# Patient Record
Sex: Male | Born: 1975
Health system: Southern US, Community
[De-identification: ages and names within clinical notes are randomized; demographics above are authoritative.]

## PROBLEM LIST (undated history)

## (undated) DIAGNOSIS — S14109A Unspecified injury at unspecified level of cervical spinal cord, initial encounter: Secondary | ICD-10-CM

## (undated) DIAGNOSIS — F1910 Other psychoactive substance abuse, uncomplicated: Secondary | ICD-10-CM

## (undated) DIAGNOSIS — N200 Calculus of kidney: Secondary | ICD-10-CM

## (undated) HISTORY — DX: Calculus of kidney: N20.0

## (undated) HISTORY — DX: Unspecified injury at unspecified level of cervical spinal cord, initial encounter: S14.109A

## (undated) HISTORY — PX: BRAIN SURGERY: SHX531

---
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
## 2003-07-14 DIAGNOSIS — S14109A Unspecified injury at unspecified level of cervical spinal cord, initial encounter: Secondary | ICD-10-CM

## 2003-07-14 HISTORY — DX: Unspecified injury at unspecified level of cervical spinal cord, initial encounter: S14.109A

## 2003-07-14 HISTORY — PX: OTHER SURGICAL HISTORY: SHX169

## 2010-07-13 DIAGNOSIS — N200 Calculus of kidney: Secondary | ICD-10-CM

## 2010-07-13 HISTORY — DX: Calculus of kidney: N20.0

## 2014-03-09 ENCOUNTER — Ambulatory Visit: Payer: PRIVATE HEALTH INSURANCE | Attending: Family Medicine | Admitting: Family Medicine

## 2014-03-09 ENCOUNTER — Encounter: Payer: Self-pay | Admitting: Family Medicine

## 2014-03-09 VITALS — BP 97/67 | HR 68 | Temp 98.3°F | Resp 20 | Ht 74.0 in | Wt 169.2 lb

## 2014-03-09 DIAGNOSIS — M216X9 Other acquired deformities of unspecified foot: Secondary | ICD-10-CM | POA: Diagnosis not present

## 2014-03-09 DIAGNOSIS — F411 Generalized anxiety disorder: Secondary | ICD-10-CM | POA: Insufficient documentation

## 2014-03-09 DIAGNOSIS — M6281 Muscle weakness (generalized): Secondary | ICD-10-CM | POA: Diagnosis not present

## 2014-03-09 DIAGNOSIS — W1789XA Other fall from one level to another, initial encounter: Secondary | ICD-10-CM | POA: Insufficient documentation

## 2014-03-09 DIAGNOSIS — IMO0002 Reserved for concepts with insufficient information to code with codable children: Secondary | ICD-10-CM | POA: Diagnosis not present

## 2014-03-09 DIAGNOSIS — M62838 Other muscle spasm: Secondary | ICD-10-CM | POA: Insufficient documentation

## 2014-03-09 DIAGNOSIS — Z9181 History of falling: Secondary | ICD-10-CM | POA: Insufficient documentation

## 2014-03-09 DIAGNOSIS — F17200 Nicotine dependence, unspecified, uncomplicated: Secondary | ICD-10-CM | POA: Insufficient documentation

## 2014-03-09 DIAGNOSIS — F329 Major depressive disorder, single episode, unspecified: Secondary | ICD-10-CM | POA: Insufficient documentation

## 2014-03-09 DIAGNOSIS — F3289 Other specified depressive episodes: Secondary | ICD-10-CM | POA: Diagnosis present

## 2014-03-09 DIAGNOSIS — F32A Depression, unspecified: Secondary | ICD-10-CM | POA: Insufficient documentation

## 2014-03-09 DIAGNOSIS — Z981 Arthrodesis status: Secondary | ICD-10-CM | POA: Diagnosis not present

## 2014-03-09 DIAGNOSIS — S14109S Unspecified injury at unspecified level of cervical spinal cord, sequela: Secondary | ICD-10-CM

## 2014-03-09 DIAGNOSIS — F419 Anxiety disorder, unspecified: Secondary | ICD-10-CM | POA: Insufficient documentation

## 2014-03-09 NOTE — Progress Notes (Signed)
Establish care Stated had a work Accident on 2005, stated he is paralyzed partially  Rt leg  Need referral for rehab

## 2014-03-09 NOTE — Progress Notes (Signed)
   Subjective:    Patient ID: Billy Kelley, male    DOB: [birth year], 38 y.o.   MRN: [id number] CC: establish care  HPI 38 year old male presents with his mother  [name]) to discuss the following:  1. Previous spinal cord injury:  Spinal cord injury 2005. 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 Patient is status post spinal fusion of C4 through C6. His fall left him with a right footdrop. He ambulates with a R AFO and a  cane. His fall left him with weakness in his right arm,  contracture is right hand especially the digits 2 through 4. He has decreased range of motion in his right shoulder to 90 of abduction. He has most recently been treated by Dr. Faythe Ghee in [place], [place] area (specifically [place], [place]). He presents today with records and all of his prescriptions. He has been on methadone treatment since 2010 prescribed by Dr. Susa Simmonds. His current methadone dose is 20 mg twice daily. He currently has a 3 week supply left. Previous treatments included other narcotics,   2. Mood disorder:  Patient diagnosed with anxiety and depression in 2014. Prior to Prozac he was tried on Zoloft without any improvement of symptoms. He currently takes Prozac 40 mg once daily along with Klonopin 0.5 mg 3 times a day as needed for anxiety. He is interested in establishing with a mental health provider in [place].  Social history: Current smoker, desires to quit.  Review of Systems  As per history of present illness      Objective:   Physical Exam BP 97/67  Pulse 68  Temp(Src) 98.3 F (36.8 C) (Oral)  Resp 20  Ht  (1.88 m)  Wt 169 lb 3.2 oz (76.749 kg)  BMI 21.71 kg/m2  SpO2 96% General appearance: alert, cooperative and no distress Lungs: clear to auscultation bilaterally Heart: regular rate and rhythm, S1, S2 normal, no murmur, click, rub or gallop Neurologic:  CN: intact Motor:   patient walks with a right foot brace and a can. His contractures at his  second and fourth digits on his right hand. His spasticity in his right elbow. He expresses his shoulder with decreased range of motion to 90.   sensation: Grossly intact Gait: Patient's gait is slow and somewhat shuffling on the right.      Assessment & Plan:

## 2014-03-09 NOTE — Assessment & Plan Note (Signed)
History of anxiety currently stable from that standpoint:  Referral to psychology to assist the patient in establishing with mental health.

## 2014-03-09 NOTE — Assessment & Plan Note (Signed)
A: spinal cord injury with sequela of right-sided spasticity in his upper extremity a right foot drop. Patient's symptoms currently controlled with baclofen, gabapentin and methadone. P: Referral placed to pain management for maintenance therapy versus a review and medically supervised change in therapy, patient is amenable to whatever is best and recommended.

## 2014-03-09 NOTE — Patient Instructions (Signed)
Billy Kelley,  Thank you for coming in today. It was a pleasure meeting you. I look forward to being your primary doctor.  Regarding pain management, I placed a referral our referral coordinators will work on scheduling.   Regarding depression and anxiety: referral to psychology, see our social worker Tywan about resources.  See me in 2 weeks so we can f/u plan for pain management.  Dr. Armen Pickup

## 2014-03-09 NOTE — Assessment & Plan Note (Signed)
Patient is a current smoker who desires to quit.   I will be sure to give him smoking cessation resources  at his followup appointment.

## 2014-03-09 NOTE — Assessment & Plan Note (Signed)
History of depression currently stable from that standpoint:  Referral to psychology to assist the patient in establishing with mental health.

## 2014-03-12 ENCOUNTER — Telehealth: Payer: Self-pay | Admitting: *Deleted

## 2014-03-12 NOTE — Progress Notes (Signed)
Recommend seeing [facility] [address] [place] [address] [phone]  Or [facility] if weaning desired  Erick Colace M.D. [facility] FAAPM&R (Sports Med, Neuromuscular Med) Diplomate Am Board of Electrodiagnostic Med

## 2014-03-12 NOTE — Telephone Encounter (Signed)
LCSW followed up with patient after meeting patient on 8/28. LCSW followed up with [facility], who stated that their next available appointment is in November. LCSW reiterated that patient cant contact his insurance carrier (Medicare) to identify psychiatry providers in the area, but can present at [facility] or [facility] for a walk-in for sooner services if needed.  Beverly Sessions MSW, LCSW

## 2014-03-16 ENCOUNTER — Telehealth: Payer: Self-pay | Admitting: Family Medicine

## 2014-03-16 NOTE — Telephone Encounter (Signed)
Called patient. Spoke with his mother. Still working on finding pain management/spasticity doctor. Also working on Magazine features editor comp to [place] since [employer] was previous payor source.   Aside: reviewed medical records. Patient on methadone therapy at least since 09/11/2011. I discussed with Dr. Kandee Keen, agrees with my plan to continue maintenance methadone therapy at 20 mg PO BID until patient can establish with PMR/neuro.

## 2014-03-20 ENCOUNTER — Telehealth: Payer: Self-pay | Admitting: Family Medicine

## 2014-03-20 NOTE — Progress Notes (Signed)
We don't rx or wean methadone here Try [facility] or [facility] [address] [place] [address] [phone]  For Detox try [facility]

## 2014-03-20 NOTE — Telephone Encounter (Signed)
Message copied by Dessa Phi on Tue Mar 20, 2014 10:54 AM ------      Message from: Dessa Phi      Created: Fri Mar 09, 2014  4:12 PM       Dr. Wynn Banker,      This is a patient I saw today who has moved to the area from [place] on Methadone therapy. He comes today with records and all of his medications.        Will you be able to accommodate him in your clinic or recommend other pain management clinics in the area? He is amenable to a methadone wean if recommended.  ------

## 2014-03-20 NOTE — Telephone Encounter (Signed)
Please call patient with recommendations from Dr. Wynn Banker:  Recommend seeing [facility]  [address]  [place] [address]  [phone]   Or [facility] if weaning desired   Erick Colace M.D.  [facility]  FAAPM&R (Sports Med, Neuromuscular Med)  Diplomate Am Board of Electrodiagnostic Med

## 2014-03-21 ENCOUNTER — Telehealth: Payer: Self-pay | Admitting: *Deleted

## 2014-03-21 ENCOUNTER — Telehealth: Payer: Self-pay | Admitting: Family Medicine

## 2014-03-21 NOTE — Telephone Encounter (Signed)
Pt. Returning call for results. Please f/u with pt.

## 2014-03-21 NOTE — Telephone Encounter (Signed)
Left voice message to returned call        Lora Paula, MD at 03/20/2014 10:54 AM      Status: Signed            Please call patient with recommendations from Dr. Wynn Banker:   Recommend seeing [facility]   [address]   [place] [address]   [phone]    Or [facility] if weaning desired    Erick Colace M.D.   [facility]   FAAPM&R (Sports Med, Neuromuscular Med)   Diplomate Am Board of Electrodiagnostic Med

## 2014-03-22 ENCOUNTER — Encounter: Payer: Self-pay | Admitting: Family Medicine

## 2014-03-22 ENCOUNTER — Ambulatory Visit: Payer: PRIVATE HEALTH INSURANCE | Attending: Family Medicine | Admitting: Family Medicine

## 2014-03-22 VITALS — BP 97/63 | HR 70 | Temp 98.1°F | Resp 16 | Ht 74.0 in | Wt 172.6 lb

## 2014-03-22 DIAGNOSIS — M545 Low back pain, unspecified: Secondary | ICD-10-CM | POA: Insufficient documentation

## 2014-03-22 DIAGNOSIS — S14109S Unspecified injury at unspecified level of cervical spinal cord, sequela: Secondary | ICD-10-CM

## 2014-03-22 DIAGNOSIS — M25559 Pain in unspecified hip: Secondary | ICD-10-CM | POA: Diagnosis not present

## 2014-03-22 DIAGNOSIS — F411 Generalized anxiety disorder: Secondary | ICD-10-CM | POA: Diagnosis not present

## 2014-03-22 DIAGNOSIS — X58XXXA Exposure to other specified factors, initial encounter: Secondary | ICD-10-CM | POA: Diagnosis not present

## 2014-03-22 DIAGNOSIS — Z23 Encounter for immunization: Secondary | ICD-10-CM

## 2014-03-22 DIAGNOSIS — R231 Pallor: Secondary | ICD-10-CM

## 2014-03-22 DIAGNOSIS — IMO0002 Reserved for concepts with insufficient information to code with codable children: Secondary | ICD-10-CM | POA: Insufficient documentation

## 2014-03-22 DIAGNOSIS — I9589 Other hypotension: Secondary | ICD-10-CM

## 2014-03-22 DIAGNOSIS — I952 Hypotension due to drugs: Secondary | ICD-10-CM

## 2014-03-22 DIAGNOSIS — F17200 Nicotine dependence, unspecified, uncomplicated: Secondary | ICD-10-CM

## 2014-03-22 DIAGNOSIS — G8929 Other chronic pain: Secondary | ICD-10-CM | POA: Diagnosis not present

## 2014-03-22 DIAGNOSIS — T50904A Poisoning by unspecified drugs, medicaments and biological substances, undetermined, initial encounter: Secondary | ICD-10-CM

## 2014-03-22 DIAGNOSIS — I959 Hypotension, unspecified: Secondary | ICD-10-CM | POA: Insufficient documentation

## 2014-03-22 LAB — COMPLETE METABOLIC PANEL WITH GFR
ALT: 19 U/L (ref 0–53)
AST: 17 U/L (ref 0–37)
Albumin: 4.3 g/dL (ref 3.5–5.2)
Alkaline Phosphatase: 58 U/L (ref 39–117)
BILIRUBIN TOTAL: 0.4 mg/dL (ref 0.2–1.2)
BUN: 13 mg/dL (ref 6–23)
CALCIUM: 9.2 mg/dL (ref 8.4–10.5)
CHLORIDE: 103 meq/L (ref 96–112)
CO2: 30 mEq/L (ref 19–32)
CREATININE: 0.79 mg/dL (ref 0.50–1.35)
GFR, Est African American: >89 mL/min
GFR, Est Non African American: >89 mL/min
Glucose, Bld: 103 mg/dL — ABNORMAL HIGH (ref 70–99)
Potassium: 4.2 mEq/L (ref 3.5–5.3)
Sodium: 141 mEq/L (ref 135–145)
Total Protein: 6.6 g/dL (ref 6.0–8.3)

## 2014-03-22 LAB — CBC
HEMATOCRIT: 38.6 % — AB (ref 39.0–52.0)
Hemoglobin: 13.6 g/dL (ref 13.0–17.0)
MCH: 29.2 pg (ref 26.0–34.0)
MCHC: 35.2 g/dL (ref 30.0–36.0)
MCV: 82.8 fL (ref 78.0–100.0)
PLATELETS: 215 10*3/uL (ref 150–400)
RBC: 4.66 MIL/uL (ref 4.22–5.81)
RDW: 14.2 % (ref 11.5–15.5)
WBC: 4.8 10*3/uL (ref 4.0–10.5)

## 2014-03-22 MED ORDER — METHADONE HCL 10 MG PO TABS: 20.0000 mg | ORAL_TABLET | Freq: Two times a day (BID) | ORAL | Status: DC

## 2014-03-22 NOTE — Assessment & Plan Note (Signed)
A: persistent P: smoking cessation recommended

## 2014-03-22 NOTE — Progress Notes (Signed)
F/u depression

## 2014-03-22 NOTE — Assessment & Plan Note (Signed)
A: chronic low BP noted. Likely due to medications. P: CBC CMP

## 2014-03-22 NOTE — Assessment & Plan Note (Signed)
A: chronic back and flank pain P: Refilled methadone Recomnmendations for pain management Recommended water exercise

## 2014-03-22 NOTE — Progress Notes (Signed)
   Subjective:    Patient ID: Billy Kelley, male    DOB: 01/23/1976, 38 y.o.   MRN: [id number] CC: 2 week f/u for depression, chronic pain following spinal cord injury  HPI 38 year old male presents for follow visit discussed the following:  #1 chronic right-sided low back and hip pain: Patient with chronic right-sided low back and hip pain following spinal cord injury. He is on chronic methadone therapy. He is working to establish with pain management since moving to [place] from [place]. Today he reports having bad right-sided flank pain. He's had this pain before. He is been worked up in the past has had negative CTs, MRIs and blood work. Pain is sharp, tight flank pain. He denies dysuria and hematuria.  Soc hx: current smoker  Review of Systems As per HPI  GAD-7 score of 14. 2 across the board.     Objective:   Physical Exam BP 97/63  Pulse 70  Temp(Src) 98.1 F (36.7 C) (Oral)  Resp 16  Ht  (1.88 m)  Wt 172 lb 9.6 oz (78.291 kg)  BMI 22.15 kg/m2  SpO2 97% General appearance: alert, cooperative and no distress Back: symmetric, no curvature. No CVA tenderness. R flank tightness.        Assessment & Plan:

## 2014-03-22 NOTE — Patient Instructions (Addendum)
Mr. Couper,  Thank you for coming back to see me today. It was a pleasure seeing you.   Pain management recommendations   [facility]  [address]  [place] [address]  [phone]   Or [facility] if weaning desired   I have refilled methadone for one month supply. Please f/u in 3-4 weeks for refill and f/u progress with establishing with pain management.   Smoking cessation support: smoking cessation hotline: 1-800-QUIT-NOW.  Smoking cessation classes are available through [facility] and [facility]. Call [phone] or visit our website at [email or web address].  Dr. Armen Pickup

## 2014-03-28 ENCOUNTER — Telehealth: Payer: Self-pay | Admitting: *Deleted

## 2014-03-28 NOTE — Telephone Encounter (Signed)
Left voice to return call  Lora Paula, MD at 03/20/2014 10:54 AM      Status: Signed            Please call patient with recommendations from Dr. Wynn Banker:   Recommend seeing [facility]   [address]   [place] [address]   [phone]    Or [facility] if weaning desired    Erick Colace M.D.   [facility]   FAAPM&R (Sports Med, Neuromuscular Med)   Diplomate Am Board of Electrodiagnostic Med

## 2014-04-02 ENCOUNTER — Encounter: Payer: Self-pay | Admitting: *Deleted

## 2014-04-02 DIAGNOSIS — F32A Depression, unspecified: Secondary | ICD-10-CM

## 2014-04-02 DIAGNOSIS — F329 Major depressive disorder, single episode, unspecified: Secondary | ICD-10-CM

## 2014-04-02 NOTE — Progress Notes (Signed)
LCSW met with patient in order to follow up on psychiatry referral. LCSW recommended that patient contact medicaid about a referral. LCSW also recommended that patient walk in at [facility] for more immediate care. LCSW also encouraged patient to be open to a change in medication depending on provider's preferences.  Christene Lye MSW, LCSW

## 2014-04-12 ENCOUNTER — Ambulatory Visit: Payer: Medicare Other | Attending: Family Medicine | Admitting: Family Medicine

## 2014-04-12 ENCOUNTER — Encounter: Payer: Self-pay | Admitting: Family Medicine

## 2014-04-12 VITALS — BP 99/66 | HR 98 | Temp 97.5°F | Resp 18 | Ht 73.0 in | Wt 175.0 lb

## 2014-04-12 DIAGNOSIS — T148 Other injury of unspecified body region: Secondary | ICD-10-CM | POA: Insufficient documentation

## 2014-04-12 DIAGNOSIS — G8921 Chronic pain due to trauma: Secondary | ICD-10-CM | POA: Diagnosis not present

## 2014-04-12 DIAGNOSIS — M21371 Foot drop, right foot: Secondary | ICD-10-CM | POA: Insufficient documentation

## 2014-04-12 DIAGNOSIS — Z720 Tobacco use: Secondary | ICD-10-CM | POA: Diagnosis not present

## 2014-04-12 DIAGNOSIS — F329 Major depressive disorder, single episode, unspecified: Secondary | ICD-10-CM | POA: Insufficient documentation

## 2014-04-12 DIAGNOSIS — F419 Anxiety disorder, unspecified: Secondary | ICD-10-CM | POA: Insufficient documentation

## 2014-04-12 DIAGNOSIS — M24541 Contracture, right hand: Secondary | ICD-10-CM | POA: Insufficient documentation

## 2014-04-12 DIAGNOSIS — S14109S Unspecified injury at unspecified level of cervical spinal cord, sequela: Secondary | ICD-10-CM

## 2014-04-12 DIAGNOSIS — F32A Depression, unspecified: Secondary | ICD-10-CM

## 2014-04-12 MED ORDER — CLONAZEPAM 0.5 MG PO TABS: 0.5000 mg | ORAL_TABLET | Freq: Three times a day (TID) | ORAL | Status: DC | PRN

## 2014-04-12 MED ORDER — FLUOXETINE HCL 40 MG PO CAPS: 40.0000 mg | ORAL_CAPSULE | Freq: Every day | ORAL | Status: DC

## 2014-04-12 MED ORDER — GABAPENTIN 300 MG PO CAPS: 600.0000 mg | ORAL_CAPSULE | Freq: Three times a day (TID) | ORAL | Status: DC

## 2014-04-12 MED ORDER — BACLOFEN 20 MG PO TABS: 20.0000 mg | ORAL_TABLET | Freq: Four times a day (QID) | ORAL | Status: DC

## 2014-04-12 MED ORDER — METHADONE HCL 10 MG PO TABS: 20.0000 mg | ORAL_TABLET | Freq: Two times a day (BID) | ORAL | Status: DC

## 2014-04-12 NOTE — Assessment & Plan Note (Signed)
A: stable symptoms. Establishing with pain management P: refilled medications.

## 2014-04-12 NOTE — Assessment & Plan Note (Signed)
A: stable.  P: Refilled prozac Patient established with [facility] for mental health services

## 2014-04-12 NOTE — Progress Notes (Signed)
F/U and medicine refill

## 2014-04-12 NOTE — Progress Notes (Signed)
   Subjective:    Patient ID: Billy Kelley, male    DOB: October 11, 1975, 38 y.o.   MRN: [id number] CC: medication refill  HPI 38 year old male presents for follow visit discussed the following:  #1 spinal cord injury: Patient with chronic spinal cord injury resulting in right hand contracture, right footdrop, chronic pain. He is here today for refill chronic medications and two provide an update about pain management. Patient reports that he has been in contact with [place] pain management was agreed to see him as a supervisor referral from my office as well as documentation regarding Workmen's compensation. Patient reports his symptoms are stable. Patient is tolerating comply with all medications.   #2 mood disorder: Patient with anxiety and depression patient tolerating Prozac 40 mg daily. Patient is [facility]. He is an appointment with his new psychiatrist next week..  Soc hx: current smoker  Review of Systems As per HPI  GAD- 7: 1-5, 2-2,3,4, 7. 3-1 and 6.      Objective:   Physical Exam BP 99/66  Pulse 98  Temp(Src) 97.5 F (36.4 C) (Oral)  Resp 18  Ht 6\' 1"  (1.854 m)  Wt 175 lb (79.379 kg)  BMI 23.09 kg/m2  SpO2 95% General appearance: alert, cooperative and no distress Gait: walks with cane and R foot AFO     Assessment & Plan:

## 2014-04-12 NOTE — Patient Instructions (Addendum)
Billy Kelley,  Thank you for coming back to see me today. I am happy to hear that you are establishing with mental health and pain management. I have will the referral re-sent to Dr. Jodean LimaKirstein's office since it sounds  His group is willing to see you and bill workman's comp. Please give me an update if there is a delay this.   I have refilled all medications today.  F/u in 1 months for methadone and klonopin refills.   Smoking cessation support: smoking cessation hotline: 1-800-QUIT-NOW.  Smoking cessation classes are available through [facility] and [facility]. Call [phone] or visit our website at [email or web address].    Dr. Armen PickupFunches

## 2014-04-26 ENCOUNTER — Telehealth: Payer: Self-pay | Admitting: Family Medicine

## 2014-04-26 NOTE — Telephone Encounter (Signed)
Please f/u    Pt walked in requesting an appointment to refill some medications. His methadone (DOLOPHINE) 10 MG tablet runs out on the 1st of nov and pt is hoping that he can receive a refill before the 27th since he will be going out of town on the 28th. He has been referred to the specialist but the specialist is still waiting for authorization. Pt is also requesting a refill for clonazePAM (KLONOPIN) 0.5 MG tablet. Please follow up with pt and advise if he is able to get these refilled without an appt.

## 2014-04-26 NOTE — Telephone Encounter (Signed)
Pt walked in requesting an appointment to refill some medications. His methadone (DOLOPHINE) 10 MG tablet runs out on the 1st of nov and pt is hoping that he can receive a refill before the 27th since he will be going out of town on the 28th. He has been referred to the specialist but the specialist is still waiting for authorization. Pt is also requesting a refill for clonazePAM (KLONOPIN) 0.5 MG tablet. Please follow up with pt and advise if he is able to get these refilled without an appt.

## 2014-05-01 MED ORDER — CLONAZEPAM 0.5 MG PO TABS: 0.5000 mg | ORAL_TABLET | Freq: Three times a day (TID) | ORAL | Status: DC | PRN

## 2014-05-01 MED ORDER — METHADONE HCL 10 MG PO TABS: 20.0000 mg | ORAL_TABLET | Freq: Two times a day (BID) | ORAL | Status: DC

## 2014-05-01 NOTE — Telephone Encounter (Signed)
Refilled methadone and klonopin. Placed up front for pick up.

## 2014-05-08 ENCOUNTER — Telehealth: Payer: Self-pay | Admitting: *Deleted

## 2014-05-08 NOTE — Telephone Encounter (Signed)
Pt notified, stated he already pick up his Rx   Hilary Hertzosalyn C Funches, MD at 05/01/2014  1:11 PM      Status: Signed            Refilled methadone and klonopin. Placed up front for pick up.

## 2014-05-15 ENCOUNTER — Encounter: Payer: Self-pay | Admitting: Physical Medicine & Rehabilitation

## 2014-05-24 ENCOUNTER — Telehealth: Payer: Self-pay | Admitting: Family Medicine

## 2014-05-24 NOTE — Telephone Encounter (Signed)
Pt. Called to request a refill for methadone (DOLOPHINE) 10 MG tablet. Please f/u with pt. °

## 2014-05-24 NOTE — Telephone Encounter (Signed)
Pt. Called to request a refill for methadone (DOLOPHINE) 10 MG tablet. Please f/u with pt.

## 2014-05-25 ENCOUNTER — Other Ambulatory Visit: Payer: Self-pay | Admitting: Family Medicine

## 2014-05-25 MED ORDER — METHADONE HCL 10 MG PO TABS: 20.0000 mg | ORAL_TABLET | Freq: Two times a day (BID) | ORAL | Status: DC

## 2014-05-25 NOTE — Telephone Encounter (Signed)
Rx for methadone placed up front for pick up

## 2014-05-25 NOTE — Telephone Encounter (Signed)
Pt. Called to request a refill for methadone (DOLOPHINE) 10 MG tablet. Please f/u with pt. °

## 2014-06-20 ENCOUNTER — Encounter
Payer: Worker's Compensation | Attending: Physical Medicine & Rehabilitation | Admitting: Physical Medicine & Rehabilitation

## 2014-06-20 ENCOUNTER — Other Ambulatory Visit: Payer: Self-pay | Admitting: Physical Medicine & Rehabilitation

## 2014-06-20 ENCOUNTER — Encounter: Payer: Self-pay | Admitting: Physical Medicine & Rehabilitation

## 2014-06-20 VITALS — BP 106/80 | HR 78 | Resp 14 | Ht 71.0 in | Wt 182.4 lb

## 2014-06-20 DIAGNOSIS — F419 Anxiety disorder, unspecified: Secondary | ICD-10-CM | POA: Diagnosis not present

## 2014-06-20 DIAGNOSIS — F1290 Cannabis use, unspecified, uncomplicated: Secondary | ICD-10-CM | POA: Diagnosis not present

## 2014-06-20 DIAGNOSIS — S12400S Unspecified displaced fracture of fifth cervical vertebra, sequela: Secondary | ICD-10-CM | POA: Diagnosis not present

## 2014-06-20 DIAGNOSIS — G8111 Spastic hemiplegia affecting right dominant side: Secondary | ICD-10-CM | POA: Insufficient documentation

## 2014-06-20 DIAGNOSIS — F329 Major depressive disorder, single episode, unspecified: Secondary | ICD-10-CM | POA: Insufficient documentation

## 2014-06-20 DIAGNOSIS — F32A Depression, unspecified: Secondary | ICD-10-CM

## 2014-06-20 DIAGNOSIS — W139XXS Fall from, out of or through building, not otherwise specified, sequela: Secondary | ICD-10-CM | POA: Diagnosis not present

## 2014-06-20 DIAGNOSIS — S14145S Brown-Sequard syndrome at C5 level of cervical spinal cord, sequela: Secondary | ICD-10-CM | POA: Insufficient documentation

## 2014-06-20 DIAGNOSIS — Z79899 Other long term (current) drug therapy: Secondary | ICD-10-CM

## 2014-06-20 DIAGNOSIS — Z5181 Encounter for therapeutic drug level monitoring: Secondary | ICD-10-CM

## 2014-06-20 DIAGNOSIS — S14155S Other incomplete lesion at C5 level of cervical spinal cord, sequela: Secondary | ICD-10-CM

## 2014-06-20 DIAGNOSIS — N319 Neuromuscular dysfunction of bladder, unspecified: Secondary | ICD-10-CM

## 2014-06-20 DIAGNOSIS — S14145A Brown-Sequard syndrome at C5 level of cervical spinal cord, initial encounter: Secondary | ICD-10-CM | POA: Insufficient documentation

## 2014-06-20 MED ORDER — CLONAZEPAM 0.5 MG PO TABS: 0.5000 mg | ORAL_TABLET | Freq: Two times a day (BID) | ORAL | Status: DC

## 2014-06-20 MED ORDER — AMITRIPTYLINE HCL 25 MG PO TABS: 25.0000 mg | ORAL_TABLET | Freq: Every day | ORAL | Status: DC

## 2014-06-20 NOTE — Patient Instructions (Signed)
SENNA-S ----2 AT BEDTIME EVERYNIGHT.---FOLLOWED DIG STIM EACH AM  IF NO BM, START USING A DULCOLAX SUPPOSITORY IN PLACE OF DIG STIM.

## 2014-06-20 NOTE — Progress Notes (Signed)
Subjective:    Patient ID: Billy Kelley, male    DOB: [birth year], 38 y.o.   MRN: [id number]  HPI  This is an initial visit for Billy Kelley who was injured in 2005 when he fell 25-30 ft off of a building in [place]. He fractured his C5 vertebra and required a fusion. He suffered a SCI as a result. He has had chronic weakness in his RUE and RLE. He wears an AFO on the right. He also suffers from right sided chest/trunk pain. He has left sided sensory loss from the left nipples on downward.   He moved here from [place] in August. He was followed by physiatry in [place].  He has had botox injections in his right arm and other medications by mouth as well. It has been some time since he's had physical and occupational therapy. He takes 20mg  baclofen TID (he had been as high 60mg  TID) as well as cymbalta, gabapentin 600mg  tid, and klonopin 0.5mg  tid prn (stopped).   He is also on methadone 20mg  q12.  He used tizanidine unsuccessfully in the past also.  He doesn't wear a splint any longer for his RUE. He does stretch his hand daily.  He has a solid posterior leaf AFO which he wears on the right leg.  He walks his dog daily. He is able to drive. He uses a cane for balance. He lives at home with his mother currently.   From a standpoint of his bowels/abdomen, he's noticed a burning pain in his right upper quadrant which travels across to the left side. This pain seems to coincide with when he's constipated. He's only moving his bowels once a week. He is using different softeners, laxatives, and suppositories but is using nothing on a scheduled basis. He really hasn't modified his diet. He doesn't drink much water. This abdominal pain affects his sleep.   With his bladder he has no problems. Habits are normal.     He is now seeing a psychiatrist and psychologist to help deal with his depression and anxiety related to the injury, ?PTSD symptoms.  He really hadn't seen anybody seriously before coming down  here. He struggles with how this injury changed his life.  Billy Kelley occasionally smokes marijuana to help him sleep. He states it's not something he uses regularly.  Mother accompanied Billy Kelley today to the office.     Pain Inventory Average Pain 8 Pain Right Now 8 My pain is constant, sharp, dull and aching  In the last 24 hours, has pain interfered with the following? General activity 8 Relation with others 10 Enjoyment of life 8 What TIME of day is your pain at its worst? morning Sleep (in general) Fair  Pain is worse with: walking, bending and standing Pain improves with: medication Relief from Meds: 1  Mobility use a cane how many minutes can you walk? 15 ability to climb steps?  yes do you drive?  yes  Function disabled: date disabled 12/2003  Neuro/Psych bowel control problems numbness trouble walking spasms depression anxiety  Prior Studies Any changes since last visit?  no  Physicians involved in your care Any changes since last visit?  no   Family History  Problem Relation Age of Onset  . Hypertension Mother   . Hyperlipidemia Mother   . Cancer Mother 61    endometrial   . Heart disease Father     CHF   . Diabetes Neg Hx    History   Social History  .  Marital Status: Single    Spouse Name: N/A    Number of Children: 1   . Years of Education: N/A   Occupational History  . Disability      [employer]    Social History Main Topics  . Smoking status: Current Every Day Smoker -- 0.50 packs/day for 20 years    Types: Cigarettes  . Smokeless tobacco: Never Used  . Alcohol Use: No  . Drug Use: Yes    Special: Marijuana     Comment: 2-3 times week   . Sexual Activity: Yes    Birth Control/ Protection: Condom   Other Topics Concern  . None   Social History Narrative   Lives with mom.   Moved from [place] recently. Also live in [place]   Son in [place].          Past Surgical History  Procedure Laterality Date  . Spinal cord fusion  2005      C4-C6   . Hernia repair Bilateral 1976  . Brain surgery     Past Medical History  Diagnosis Date  . 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 Kidney stone 2012    passed with fluids    Ht 5\' 11"  (1.803 m)  Wt 182 lb 6.4 oz (82.736 kg)  BMI 25.45 kg/m2  Opioid Risk Score:   Fall Risk Score: Moderate Fall Risk (6-13 points)  Review of Systems  Constitutional: Positive for unexpected weight change.       Weight loss  HENT: Negative.   Eyes: Negative.   Respiratory: Negative.   Cardiovascular: Negative.   Gastrointestinal: Positive for abdominal pain, diarrhea, constipation and abdominal distention.       Bowel problems  Endocrine: Negative.   Genitourinary: Negative.   Musculoskeletal: Positive for myalgias, back pain and neck pain.  Skin: Negative.   Allergic/Immunologic: Negative.   Neurological: Positive for numbness.       Trouble walking, spasms  Hematological: Negative.   Psychiatric/Behavioral: Positive for dysphoric mood. The patient is nervous/anxious.        Objective:   Physical Exam   General: Alert and oriented x 3, No apparent distress HEENT: Head is normocephalic, atraumatic, PERRLA, EOMI, sclera anicteric, oral mucosa pink and moist, dentition intact, ext ear canals clear,  Neck: Supple without JVD or lymphadenopathy Heart: Reg rate and rhythm. No murmurs rubs or gallops Chest: CTA bilaterally without wheezes, rales, or rhonchi; no distress Abdomen: Soft, non-tender, non-distended, bowel sounds positive. Extremities: No clubbing, cyanosis, or edema. Pulses are 2+ Skin: Clean and intact without signs of breakdown Neuro: Alert and oriented x3. CN exam intact. 3+ to 4/5 deltoid, bicep. Trace wrist and elbow extension, HI, 1/5 grip. Flexor contractures of the right fingers and wrists. Underlying tone of 3/4. Decreased LT,PP 1+/2 RUE. LUE nearly 5/5 prox to distal. Normal sensory  LUE. RLE: 2 HF, HE, 2- KE and KF, trace ADF/eversion, absent PF. Tone at ankle is 3-4/4. Fluctuating tone knee at 2-3/4. Pt's LLE: trace to 0/2 sensation. Strength grossly 4 to 4+/5. Pt ambulates using his straight cane and employs a steppage gait pattern to help clear the right foot. He drags the left leg a bit. Truncal sensation 1/2 left side. Right trunk sensation nearly normal. May have had some muscle spasm in the abdominal musculature.  Musculoskeletal: general sensitivity to touch in the abdomen, some pain with deeper palpation over  the rib cage and the thoracic and lumbar paraspinals. Fairly good pelvic symmetry while standing. Minimal pain with palpation of the low back and pelvis.  Psych: Pt's affect was generally appropriate. Did become tearful when discussing his problems and depression        Assessment & Plan:  1. C5 fx/SCI with [name] Presentation 2. Spastic right hemiparesis 3. Neurogenic bowel 4. Reactive depression with anxiety, ?PTSD   Plan: 1. Pt has agreed to stop using marijuana. I told him I will not prescribe any narcotics if he's using marijuana. He was completely on board with this plan. 2. Discussed regular bowel regimen starting with senna-s at night and dig stim each am. Move to suppository q am if needed. 3. Trial of elavil 25mg  qhs for sleep and nerve pain 4. UDS was collected today. His PCP will fill methadone this month. 5. Made a referral to neuro-rehab to work on ROM, spasticity, mobility, orthotic evaluation. Would like to look at botox again. ?phenol 6. Same baclofen for now. Will resume klonopin on a scheduled basis for clonus/spasticity, 0.5mg  q12. 7. Follow up with me in one month approximately. One hour of face to face patient care time was spent during this visit. All questions were encouraged and answered.   Forty-five minutes of face to face patient care time were spent during this visit. All questions were encouraged and answered.

## 2014-06-22 LAB — PMP ALCOHOL METABOLITE (ETG): ETGU: NEGATIVE ng/mL

## 2014-06-25 LAB — BENZODIAZEPINES (GC/LC/MS), URINE
Alprazolam metabolite (GC/LC/MS), ur confirm: NEGATIVE ng/mL
Clonazepam metabolite (GC/LC/MS), ur confirm: 69 ng/mL — AB
Flurazepam metabolite (GC/LC/MS), ur confirm: NEGATIVE ng/mL
Lorazepam (GC/LC/MS), ur confirm: NEGATIVE ng/mL
Midazolam (GC/LC/MS), ur confirm: NEGATIVE ng/mL
Nordiazepam (GC/LC/MS), ur confirm: NEGATIVE ng/mL
Oxazepam (GC/LC/MS), ur confirm: NEGATIVE ng/mL
Temazepam (GC/LC/MS), ur confirm: NEGATIVE ng/mL
Triazolam metabolite (GC/LC/MS), ur confirm: NEGATIVE ng/mL

## 2014-06-25 LAB — MEPERIDINE (GC/LC/MS), URINE
Meperidine (GC/LC/MS), ur confirm: NEGATIVE ng/mL (ref <100)
NORMEPERIDINE UR CONFIRM: NEGATIVE ng/mL (ref <100)

## 2014-06-25 LAB — METHADONE (GC/LC/MS), URINE
EDDPUC: 12661 ng/mL — AB (ref <100)
METHADONEUC: 6792 ng/mL — AB (ref <100)

## 2014-06-25 LAB — CANNABANOIDS (GC/LC/MS), URINE: THC-COOH (GC/LC/MS), ur confirm: 60 ng/mL — AB

## 2014-06-26 LAB — PRESCRIPTION MONITORING PROFILE (SOLSTAS)
Amphetamine/Meth: NEGATIVE ng/mL
BUPRENORPHINE, URINE: NEGATIVE ng/mL
Barbiturate Screen, Urine: NEGATIVE ng/mL
CREATININE, URINE: 152.46 mg/dL (ref >20.0)
Carisoprodol, Urine: NEGATIVE ng/mL
Cocaine Metabolites: NEGATIVE ng/mL
ECSTASY: NEGATIVE ng/mL
Fentanyl, Ur: NEGATIVE ng/mL
NITRITES URINE, INITIAL: NEGATIVE ug/mL
OXYCODONE SCRN UR: NEGATIVE ng/mL
Opiate Screen, Urine: NEGATIVE ng/mL
PH URINE, INITIAL: 5.8 pH (ref 4.5–8.9)
PROPOXYPHENE: NEGATIVE ng/mL
TRAMADOL UR: NEGATIVE ng/mL
Tapentadol, urine: NEGATIVE ng/mL
Zolpidem, Urine: NEGATIVE ng/mL

## 2014-06-27 ENCOUNTER — Other Ambulatory Visit: Payer: Self-pay | Admitting: Family Medicine

## 2014-06-27 NOTE — Progress Notes (Signed)
Urine drug screen for this encounter is positive for marijuana

## 2014-07-03 ENCOUNTER — Other Ambulatory Visit: Payer: Self-pay | Admitting: Family Medicine

## 2014-07-09 ENCOUNTER — Telehealth: Payer: Self-pay | Admitting: Family Medicine

## 2014-07-09 NOTE — Telephone Encounter (Signed)
Pt is requesting a refill on methadone (DOLOPHINE) 10 MG tablet and is stating that he was advised to take 20mg  twice a day. He is also stating that this will be his last time refilling at our pharmacy because he has a new provider that will prescribe this.

## 2014-07-09 NOTE — Telephone Encounter (Signed)
Pt is requesting a refill on methadone (DOLOPHINE) 10 MG tablet and is stating that he was advised to take 20mg  twice a day. He is also stating that this will be his last time refilling at our pharmacy because he has a new provider that will prescribe this.   Please f/u

## 2014-07-10 MED ORDER — METHADONE HCL 10 MG PO TABS: 20.0000 mg | ORAL_TABLET | Freq: Two times a day (BID) | ORAL | Status: DC

## 2014-07-10 NOTE — Telephone Encounter (Signed)
Called patient. Methadone rx placed up front for pick up.

## 2014-07-18 ENCOUNTER — Encounter: Payer: Self-pay | Admitting: Registered Nurse

## 2014-07-18 ENCOUNTER — Encounter: Payer: Worker's Compensation | Attending: Registered Nurse | Admitting: Registered Nurse

## 2014-07-18 VITALS — BP 107/49 | HR 66 | Resp 14

## 2014-07-18 DIAGNOSIS — Z79899 Other long term (current) drug therapy: Secondary | ICD-10-CM

## 2014-07-18 DIAGNOSIS — F329 Major depressive disorder, single episode, unspecified: Secondary | ICD-10-CM | POA: Insufficient documentation

## 2014-07-18 DIAGNOSIS — F32A Depression, unspecified: Secondary | ICD-10-CM

## 2014-07-18 DIAGNOSIS — S14145S Brown-Sequard syndrome at C5 level of cervical spinal cord, sequela: Secondary | ICD-10-CM | POA: Diagnosis not present

## 2014-07-18 DIAGNOSIS — Z5181 Encounter for therapeutic drug level monitoring: Secondary | ICD-10-CM

## 2014-07-18 DIAGNOSIS — G8111 Spastic hemiplegia affecting right dominant side: Secondary | ICD-10-CM | POA: Diagnosis not present

## 2014-07-18 DIAGNOSIS — F419 Anxiety disorder, unspecified: Secondary | ICD-10-CM | POA: Diagnosis not present

## 2014-07-18 DIAGNOSIS — F1290 Cannabis use, unspecified, uncomplicated: Secondary | ICD-10-CM | POA: Insufficient documentation

## 2014-07-18 DIAGNOSIS — S14155S Other incomplete lesion at C5 level of cervical spinal cord, sequela: Secondary | ICD-10-CM

## 2014-07-18 DIAGNOSIS — S12400S Unspecified displaced fracture of fifth cervical vertebra, sequela: Secondary | ICD-10-CM

## 2014-07-18 MED ORDER — METHADONE HCL 10 MG PO TABS: 20.0000 mg | ORAL_TABLET | Freq: Two times a day (BID) | ORAL | Status: DC

## 2014-07-18 MED ORDER — BACLOFEN 20 MG PO TABS: 20.0000 mg | ORAL_TABLET | Freq: Four times a day (QID) | ORAL | Status: DC

## 2014-07-18 NOTE — Progress Notes (Signed)
Subjective:    Patient ID: Billy Kelley, male    DOB: [birth year], 39 y.o.   MRN: [id number]  HPI: Billy Kelley is a 39 year old male who returns for follow up for chronic pain and medication refill. He says his pain is located in his right arm and lower back. He rates his pain 5. His current exercise regime is walking and performing stretching exercises.  Surgical History Cervical Fusion C-4- C-6 Mother in room all questions answered.  Neuro Rehabilitation didn't call patient with start up date, workman's compensation only approved till 07/12/2014. A new referral has been sent to workman's compensation and a call has been placed to Neuro-Rehabilitation.   Pain Inventory Average Pain 8 Pain Right Now 5 My pain is constant, stabbing and aching  In the last 24 hours, has pain interfered with the following? General activity 6 Relation with others 9 Enjoyment of life 10 What TIME of day is your pain at its worst? morning Sleep (in general) Poor  Pain is worse with: walking and inactivity Pain improves with: rest and medication Relief from Meds: 7  Mobility walk with assistance use a cane how many minutes can you walk? 10 ability to climb steps?  yes do you drive?  yes Do you have any goals in this area?  yes  Function not employed: date last employed 12/16/03 disabled: date disabled 12/16/03 I need assistance with the following:  dressing, meal prep and household duties  Neuro/Psych bladder control problems bowel control problems weakness numbness trouble walking spasms depression anxiety  Prior Studies Any changes since last visit?  no  Physicians involved in your care Any changes since last visit?  no   Family History  Problem Relation Age of Onset  . Hypertension Mother   . Hyperlipidemia Mother   . Cancer Mother 3250    endometrial   . Heart disease Father     CHF   . Diabetes Neg Hx    History   Social History  . Marital Status: Single    Spouse Name: N/A    Number of Children: 1   . Years of Education: N/A   Occupational History  . Disability      [employer]    Social History Main Topics  . Smoking status: Current Every Day Smoker -- 0.50 packs/day for 20 years    Types: Cigarettes  . Smokeless tobacco: Never Used  . Alcohol Use: No  . Drug Use: Yes    Special: Marijuana     Comment: 2-3 times week   . Sexual Activity: Yes    Birth Control/ Protection: Condom   Other Topics Concern  . None   Social History Narrative   Lives with mom.   Moved from [place] recently. Also live in [place]   Son in [place].          Past Surgical History  Procedure Laterality Date  . Spinal cord fusion  2005     C4-C6   . Hernia repair Bilateral 1976  . Brain surgery     Past Medical History  Diagnosis Date  . 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 Kidney stone 2012    passed with fluids    BP 107/49 mmHg  Pulse 66  Resp 14  SpO2 95%  Opioid Risk Score:   Fall Risk Score: Low Fall Risk (0-5 points) (pt given  the fall prevention pamphlet during todays visit) Review of Systems  Constitutional: Positive for appetite change and unexpected weight change.       Weight loss Poor appetite  Gastrointestinal: Positive for abdominal pain and constipation.  Musculoskeletal: Positive for gait problem.  Neurological: Positive for weakness and numbness.       Spasms  Psychiatric/Behavioral: Positive for dysphoric mood. The patient is nervous/anxious.   All other systems reviewed and are negative.      Objective:   Physical Exam  Constitutional: He is oriented to person, place, and time. He appears well-developed and well-nourished.  HENT:  Head: Normocephalic and atraumatic.  Neck: Normal range of motion. Neck supple.  Cardiovascular: Normal rate and regular rhythm.   Pulmonary/Chest: Effort normal and breath sounds normal.  Musculoskeletal:    Normal Muscle Bulk and Muscle Testing Reveals: Upper Extremities:LeftL Full ROM and Muscle strength 4/5 Right: Decreased ROM 90 Degrees and Muscle Strength 2/5 Right AC Joint Tenderness Thoracic Paraspinal tenderness: Mainly Right Side T-4- T-7 Lower Extremities: Left Full ROM and Muscle strength 5/5 Right: Decreased ROM with Extension unable to Flex. Right Lower extremity AFO Arises from chair with slight difficulty using straight cane for support.  Neurological: He is alert and oriented to person, place, and time.  Skin: Skin is warm and dry.  Psychiatric: He has a normal mood and affect.  Nursing note and vitals reviewed.         Assessment & Plan:  1. C5 fx/SCI with [name] Presentation: Refilled: Methadone 10 mg two tablets every 12 hours #120 Continue Elavil for nerve Pain 2. Spastic right hemiparesis: Continue Baclofen and Klonopin 3. Neurogenic bowel: Continue with Bowel Regimen 4. Reactive depression with anxiety, ?PTSD 5. Insomnia: Contimue Elavil  30 minutes of face to face patient care time was spent during this visit. All questions were encouraged and answered.   F/U in 1 month

## 2014-08-07 ENCOUNTER — Ambulatory Visit: Payer: Medicare Other

## 2014-08-15 ENCOUNTER — Ambulatory Visit: Payer: Worker's Compensation | Attending: Physical Medicine & Rehabilitation

## 2014-08-15 DIAGNOSIS — R269 Unspecified abnormalities of gait and mobility: Secondary | ICD-10-CM | POA: Insufficient documentation

## 2014-08-20 ENCOUNTER — Ambulatory Visit: Payer: Worker's Compensation | Admitting: Physical Therapy

## 2014-08-20 ENCOUNTER — Encounter: Payer: Self-pay | Admitting: Physical Therapy

## 2014-08-20 DIAGNOSIS — R29898 Other symptoms and signs involving the musculoskeletal system: Secondary | ICD-10-CM

## 2014-08-20 DIAGNOSIS — R269 Unspecified abnormalities of gait and mobility: Secondary | ICD-10-CM

## 2014-08-20 NOTE — Therapy (Signed)
[facility] [address] [place], [place], [address] Phone: [phone]   Fax:  [phone]  Physical Therapy Evaluation  Patient Details  Name: Billy Kelley MRN: [id number] Date of Birth: 1976/03/28 Referring Provider:  Ranelle Oyster, MD  Encounter Date: 08/20/2014      PT End of Session - 08/20/14 1040    Visit Number 1   Number of Visits --  10   Date for PT Re-Evaluation 09/18/14   Authorization Type Worker's comp   Authorization - Visit Number 1   Authorization - Number of Visits 10   PT Start Time 0800   PT Stop Time 0848   PT Time Calculation (min) 48 min      Past Medical History  Diagnosis Date  . 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 Kidney stone 2012    passed with fluids     Past Surgical History  Procedure Laterality Date  . Spinal cord fusion  2005     C4-C6   . Hernia repair Bilateral 1976  . Brain surgery      There were no vitals taken for this visit.  Visit Diagnosis:  Abnormality of gait - Plan: PT plan of care cert/re-cert  Weakness of right leg - Plan: PT plan of care cert/re-cert      Subjective Assessment - 08/20/14 1036    Symptoms Pt. reports being in constant pain in RUE and back; c/o tightness in R side especially with associated reaction with yawing with RUE and RLE tightening up; pt. states he wants to learn exercises to help him walk better and have less pain and tightness   Pertinent History  increased spasticity in R hand with fingers flexed - may be getting Botox injection; C4-C6 fusion due to C5 SCI with [employer] syndrome on 12-25-03 after falling off roof while working in [place]   Patient Stated Goals obtain new AFO to improve walking; learn exercises to do at gym          [facility] PT Assessment - 08/20/14 0819    Assessment   Medical Diagnosis C5 SCI with [name] syndrome    Onset Date 12/25/03   Next MD Visit 08-22-14   Prior Therapy 7 years ago in [place]   Precautions   Precautions Fall   Required Braces or Orthoses Other Brace/Splint  RLE - AFO which is 41 years old   Balance Screen   Has the patient fallen in the past 6 months No   Has the patient had a decrease in activity level because of a fear of falling?  No   Is the patient reluctant to leave their home because of a fear of falling?  No   Home Environment   Living Enviornment Private residence   Type of Home Apartment   Home Access Stairs to enter   Entrance Stairs-Number of Steps 12   Home Layout Other (Comment)  lives in apt. on 2nd floor    Prior Function   Level of Independence Independent with homemaking with ambulation   Strength   Right Hip Flexion 3-/5   Right Hip Extension 4/5   Right Hip ABduction 2+/5   Right Knee Flexion 2-/5   Right Knee Extension 4/5   Right Ankle Dorsiflexion 1/5   Right Ankle Plantar Flexion 1/5   Right Ankle Inversion 0/5   Right Ankle Eversion  0/5   Ambulation/Gait   Ambulation/Gait Yes   Ambulation/Gait Assistance 6: Modified independent (Device/Increase time)   Ambulation Distance (Feet) 120 Feet   Assistive device Straight cane   Gait Pattern Decreased hip/knee flexion - right;Right flexed knee in stance;Ataxic   Ambulation Surface Level   Gait velocity 2.76   with cane (11.87 secs)   Balance   Balance Assessed Yes   Static Standing Balance   Static Standing - Comment/# of Minutes pt. able to stand on LLE 7.84 secs; unable to stand on RLE   Timed Up and Go Test   Normal TUG (seconds) 13.63  with straight cane   RLE Tone   RLE Tone Hypertonic;Moderate                            PT Short Term Goals - 08/20/14 1053    PT SHORT TERM GOAL #1   Title Obtain consult for new RLE AFO if approved by [facility] case manager.   Baseline 09-18-14   Time 4   Period Weeks   Status New   PT SHORT TERM GOAL #2   Title Pt. will report at  least 25% improvement in low back pain/tightness after performing HEP in AM.   Baseline rates pain 5/10 at time of eval   Time 4   Period Weeks   Status New   PT SHORT TERM GOAL #3   Title Improve TUG score to </= 12.5 with cane for decr. fall risk   Baseline 13.63 secs with cane   Time 4   Period Weeks   Status New   PT SHORT TERM GOAL #4   Title Incr. gait velocity to >/= 3.0 ft/sec with cane with S for incr. gait efficiency.   Baseline 2.76 ft/sec with cane   Time 4   Period Weeks   Status New           PT Long Term Goals - 08/20/14 1057    PT LONG TERM GOAL #1   Title Report at least 30% decrease in right trunk and low back pain/discomfort due to tightness and spasticity.   Baseline 10-18-14                      pain 5/10 on 08-20-14   Time 8   Period Weeks   Status New   PT LONG TERM GOAL #2   Title Independently don and doff AFO for RLE in new AFO is obtained   Baseline 10-18-14   Time 8   Period Weeks   Status New   PT LONG TERM GOAL #3   Title Increase gait velocity to >/= 3.2 ft/sec with new AFO with cane.   Baseline 10-18-14                           2.76 ft/sec with cane   Time 8   Status New   PT LONG TERM GOAL #4   Title Independent in HEP for stretching and strengthening.   Baseline 10-18-14   Time 8   Period Weeks   Status New   PT LONG TERM GOAL #5   Title Improve TUG score to </= 11.5 secs with cane for decr. fall risk.   Baseline 10-18-14   Time 8   Period Weeks   Status New  Plan - 08/20/14 1041    Clinical Impression Statement Pt. needs new R AFO to improve gait and minimize deviations;  pt. presents with moderate tone/spasticity in RUE and RLE with significant weakness noted in R hamstrings, hip abductors, and ankle dorsiflexors, invertors, evertors, and plantarflexors  Pt. would benefit from a toe-off AFO for RLE   Pt will benefit from skilled therapeutic intervention in order to improve on the following deficits Abnormal  gait;Decreased coordination;Impaired flexibility;Impaired tone;Decreased balance;Decreased strength;Decreased mobility;Decreased range of motion   Rehab Potential Good   PT Frequency 2x / week   PT Duration 4 weeks  followed by 1x/week x 2 weeks   PT Treatment/Interventions ADLs/Self Care Home Management;Therapeutic activities;Patient/family education;Therapeutic exercise;Gait training;Balance training;Stair training;Neuromuscular re-education;Functional mobility training;Other (comment)  orthotic training and fitting   PT Next Visit Plan HEP for low back stretches and RLE stretches and strengthening ; will coordinate consult with orthotist when approved with case manager for new AFO   PT Home Exercise Plan stretching and strengthening   Recommended Other Services new AFO for RLE   Consulted and Agree with Plan of Care Patient         Problem List Patient Active Problem List   Diagnosis Date Noted  . 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 Neurogenic bladder 06/20/2014  . Low BP 03/22/2014  . Depression 03/09/2014  . Anxiety 03/09/2014  . Smoker 03/09/2014  . Closed fracture of C5-C7 level with incomplete spinal cord lesion     Tara Rud, Donavan BurnetLinda Suzanne, PT 08/20/2014, 12:09 PM  [facility] [address] [place], [place], [address] Phone: [phone]   Fax:  [phone]

## 2014-08-22 ENCOUNTER — Encounter: Payer: Self-pay | Admitting: Physical Medicine & Rehabilitation

## 2014-08-22 ENCOUNTER — Encounter: Payer: Worker's Compensation | Attending: Registered Nurse | Admitting: Physical Medicine & Rehabilitation

## 2014-08-22 VITALS — BP 115/55 | HR 79 | Resp 14

## 2014-08-22 DIAGNOSIS — S14155S Other incomplete lesion at C5 level of cervical spinal cord, sequela: Secondary | ICD-10-CM

## 2014-08-22 DIAGNOSIS — F419 Anxiety disorder, unspecified: Secondary | ICD-10-CM | POA: Insufficient documentation

## 2014-08-22 DIAGNOSIS — S12400S Unspecified displaced fracture of fifth cervical vertebra, sequela: Secondary | ICD-10-CM

## 2014-08-22 DIAGNOSIS — S14145S Brown-Sequard syndrome at C5 level of cervical spinal cord, sequela: Secondary | ICD-10-CM | POA: Diagnosis not present

## 2014-08-22 DIAGNOSIS — F329 Major depressive disorder, single episode, unspecified: Secondary | ICD-10-CM | POA: Diagnosis not present

## 2014-08-22 DIAGNOSIS — G8111 Spastic hemiplegia affecting right dominant side: Secondary | ICD-10-CM | POA: Diagnosis not present

## 2014-08-22 DIAGNOSIS — F1290 Cannabis use, unspecified, uncomplicated: Secondary | ICD-10-CM | POA: Insufficient documentation

## 2014-08-22 DIAGNOSIS — F32A Depression, unspecified: Secondary | ICD-10-CM

## 2014-08-22 DIAGNOSIS — N319 Neuromuscular dysfunction of bladder, unspecified: Secondary | ICD-10-CM

## 2014-08-22 DIAGNOSIS — G8110 Spastic hemiplegia affecting unspecified side: Secondary | ICD-10-CM

## 2014-08-22 MED ORDER — CLONAZEPAM 0.5 MG PO TABS: 0.5000 mg | ORAL_TABLET | Freq: Three times a day (TID) | ORAL | Status: DC

## 2014-08-22 MED ORDER — DOCUSATE SODIUM 283 MG RE ENEM: 1.0000 | ENEMA | Freq: Every day | RECTAL | Status: DC

## 2014-08-22 MED ORDER — AMITRIPTYLINE HCL 25 MG PO TABS: 25.0000 mg | ORAL_TABLET | Freq: Every day | ORAL | Status: DC

## 2014-08-22 NOTE — Addendum Note (Signed)
Addended by: Faith RogueSWARTZ, Diania Co T on: 08/22/2014 02:31 PM   Modules accepted: Orders

## 2014-08-22 NOTE — Patient Instructions (Signed)
PLEASE CALL ME WITH ANY PROBLEMS OR QUESTIONS (#[phone]).      

## 2014-08-22 NOTE — Progress Notes (Signed)
Subjective:    Patient ID: Billy Kelley, male    DOB: 1975/12/06, 39 y.o.   MRN: [id number]  HPI   Billy Kelley is back regarding his chronic pain and SCI. He started the bowel program as we've suggested. He is using lax/softeners and a suppository in the morning. He has had some gas at times.  The schedule has improved his pain, but he still has recurrent symptoms over his trunk just prior to emptying his bowels.   Therapy is working on his gait. They are bringing in an orthotist next week to assess for a new AFO.   The klonopin is helping his spasticity. He feels that he would do better if he had a dose to cover the middle of the day.    Pain Inventory Average Pain 5 Pain Right Now 4 My pain is constant, sharp and aching  In the last 24 hours, has pain interfered with the following? General activity 4 Relation with others 4 Enjoyment of life 10 What TIME of day is your pain at its worst? morning Sleep (in general) Fair  Pain is worse with: bending and inactivity Pain improves with: pacing activities and medication Relief from Meds: 7  Mobility walk without assistance use a cane how many minutes can you walk? 15 ability to climb steps?  yes do you drive?  yes Do you have any goals in this area?  no  Function disabled: date disabled . Do you have any goals in this area?  yes  Neuro/Psych bladder control problems bowel control problems numbness tingling trouble walking spasms depression anxiety  Prior Studies Any changes since last visit?  no  Physicians involved in your care Any changes since last visit?  no   Family History  Problem Relation Age of Onset  . Hypertension Mother   . Hyperlipidemia Mother   . Cancer Mother 5650    endometrial   . Heart disease Father     CHF   . Diabetes Neg Hx    History   Social History  . Marital Status: Single    Spouse Name: N/A  . Number of Children: 1   . Years of Education: N/A   Occupational History  .  Disability      [employer]    Social History Main Topics  . Smoking status: Current Every Day Smoker -- 0.50 packs/day for 20 years    Types: Cigarettes  . Smokeless tobacco: Never Used  . Alcohol Use: No  . Drug Use: Yes    Special: Marijuana     Comment: 2-3 times week   . Sexual Activity: Yes    Birth Control/ Protection: Condom   Other Topics Concern  . None   Social History Narrative   Lives with mom.   Moved from [place] recently. Also live in [place]   Son in [place].          Past Surgical History  Procedure Laterality Date  . Spinal cord fusion  2005     C4-C6   . Hernia repair Bilateral 1976  . Brain surgery     Past Medical History  Diagnosis Date  . 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 Kidney stone 2012    passed with fluids    BP 115/55 mmHg  Pulse 79  Resp 14  SpO2 96%  Opioid Risk Score:   Fall Risk Score:  Review of Systems  Gastrointestinal: Positive for abdominal pain, constipation and abdominal distention.  Musculoskeletal: Positive for gait problem.  Neurological: Positive for numbness.       Tingling spasms  Psychiatric/Behavioral: Positive for dysphoric mood. The patient is nervous/anxious.        Objective:   Physical Exam  General: Alert and oriented x 3, No apparent distress  HEENT: Head is normocephalic, atraumatic, PERRLA, EOMI, sclera anicteric, oral mucosa pink and moist, dentition intact, ext ear canals clear,  Neck: Supple without JVD or lymphadenopathy  Heart: Reg rate and rhythm. No murmurs rubs or gallops  Chest: CTA bilaterally without wheezes, rales, or rhonchi; no distress  Abdomen: Soft, non-tender, non-distended, bowel sounds positive.  Extremities: No clubbing, cyanosis, or edema. Pulses are 2+  Skin: Clean and intact without signs of breakdown  Neuro: Alert and oriented x3. CN exam intact. 3+ to 4/5 deltoid, bicep. Trace wrist and elbow  extension, HI, 1/5 grip. Flexor contractures of the right fingers and wrists. Underlying tone of 3/4. Decreased LT,PP 1+/2 RUE. LUE nearly 5/5 prox to distal. Normal sensory LUE. RLE: 2 HF, HE, 2- KE and KF, trace ADF/eversion, absent PF. Tone at ankle is 3-4/4. Fluctuating tone knee at 2-3/4. Pt's LLE: trace to 0/2 sensation. Strength grossly 4 to 4+/5. Pt ambulates using his straight cane and employs a steppage gait pattern to help clear the right foot---improved. Truncal sensation 1/2 left side. Right trunk sensation nearly normal. May have had some muscle spasm in the abdominal musculature.  Musculoskeletal: less sensitivity to touch in the abdomen, some pain with deeper palpation over the rib cage and the thoracic and lumbar paraspinals. Fairly good pelvic symmetry while standing. Minimal pain with palpation of the low back and pelvis.  Psych: Pt's affect was generally appropriate. Did become tearful when discussing his problems and depression   Assessment & Plan:   1. C5 fx/SCI with Billy Kelley Presentation  2. Spastic right hemiparesis  3. Neurogenic bowel  4. Reactive depression with anxiety, ?PTSD    Plan:  1. He smoked marijuana 2 weeks ago. No narcs until clean UDS.   2. Working on his bowel program with lax/soft and suppositories. Will add simethicone for gas. making progress there.  3. Resume elavil  qhs for sleep and nerve pain  4. Methadone per PCP.  5. Continue with neuro-rehab to work on ROM, spasticity, mobility, orthotic evaluation.   -new AFO per [employer]  -Botox to right wrist and finger flexors---300units---perhaps phenol at some point?  6. Same baclofen for now. Can increase klonopin to 0.5mg  q8.  7. Follow up with me or NP in one month approximately---hopefully for botox.  All questions were encouraged and answered.  30 minutes of face to face patient care time were spent during this visit. All questions were encouraged and answered.

## 2014-08-27 ENCOUNTER — Ambulatory Visit: Payer: Worker's Compensation | Admitting: Physical Therapy

## 2014-08-27 ENCOUNTER — Ambulatory Visit: Payer: Worker's Compensation

## 2014-08-30 ENCOUNTER — Telehealth: Payer: Self-pay | Admitting: *Deleted

## 2014-08-30 ENCOUNTER — Encounter: Payer: Self-pay | Admitting: Physical Therapy

## 2014-08-30 ENCOUNTER — Ambulatory Visit: Payer: Worker's Compensation | Attending: Physical Medicine & Rehabilitation | Admitting: Physical Therapy

## 2014-08-30 DIAGNOSIS — R269 Unspecified abnormalities of gait and mobility: Secondary | ICD-10-CM

## 2014-08-30 DIAGNOSIS — R29898 Other symptoms and signs involving the musculoskeletal system: Secondary | ICD-10-CM | POA: Diagnosis not present

## 2014-08-30 DIAGNOSIS — M6249 Contracture of muscle, multiple sites: Secondary | ICD-10-CM | POA: Diagnosis present

## 2014-08-30 NOTE — Patient Instructions (Signed)
Pelvic Tilt   Lie on back, legs bent. Exhale, tilting top of pelvis back, pubic bone up, to flatten lower back. Inhale, rolling pelvis opposite way, top forward, pubic bone down, arch in back. Repeat 10 times. Do 1 sessions per day.  Copyright  VHI. All rights reserved.   Bridging   Slowly raise buttocks from floor, keeping stomach tight. Repeat 10 times per set.  Do 1 sessions per day.  http://orth.exer.us/1096   Copyright  VHI. All rights reserved.    HIP / KNEE: Flexion, Knee to Chest - Supine Lumbar Rotation: Caudal - Bilateral (Supine)   Feet and knees together, arms outstretched, rotate knees left, turning head in opposite direction, until stretch is felt. Hold 30 seconds. Relax. Repeat 3 times per set. Do 1 sessions per day.  http://orth.exer.us/1020   Copyright  VHI. All rights reserved.   Knee-to-Chest Stretch: Unilateral   With hand behind right knee, pull knee in to chest until a comfortable stretch is felt in lower back and buttocks. Keep back relaxed. Hold 30 seconds. Repeat 3 times per set. Do 1 sessions per day.  http://orth.exer.us/126   Copyright  VHI. All rights reserved.    Copyright  VHI. All rights reserved.   Double Knee to Chest (Flexion)   Gently pull both knees toward chest. Feel stretch in lower back or buttock area. Breathing deeply, Hold 30 seconds. Repeat 3 times. Do 1 sessions per day.  http://gt2.exer.us/227   Copyright  VHI. All rights reserved.   Unweighted Pelvic Tilt   Sit on solid surface. Gently rock hips forward and backward. Do 1 sets of 10 repetitions.  Copyright  VHI. All rights reserved.   Leg Extension (Hamstring)   Sit toward front edge of chair, with leg out straight, heel on floor, toes pointing toward body. Keeping back straight, bend forward at hip, breathing out through pursed lips. Return, breathing in.  Hold for 60 seconds. Repeat 3 times. Repeat with other leg. Do 2 sessions per  day.   Copyright  VHI. All rights reserved.

## 2014-08-30 NOTE — Therapy (Signed)
[facility] [address] [place], [place], [address] Phone: [phone]   Fax:  [phone]  Physical Therapy Treatment  Patient Details  Name: Billy Kelley MRN: [id number] Date of Birth: May 06, 1976 Referring Provider:  Lora Paula, MD  Encounter Date: 08/30/2014      PT End of Session - 08/30/14 1226    Visit Number 2   Number of Visits 10   Date for PT Re-Evaluation 09/18/14   Authorization Type Worker's comp   Authorization - Number of Visits 10   PT Start Time 1018   PT Stop Time 1105   PT Time Calculation (min) 47 min   Behavior During Therapy [facility] for tasks assessed/performed      Past Medical History  Diagnosis Date  . 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 Kidney stone 2012    passed with fluids     Past Surgical History  Procedure Laterality Date  . Spinal cord fusion  2005     C4-C6   . Hernia repair Bilateral 1976  . Brain surgery      There were no vitals taken for this visit.  Visit Diagnosis:  Abnormality of gait  Weakness of right leg      Subjective Assessment - 08/30/14 1025    Symptoms Pt saw Dr Riley Kill who wrote order for AFO.  Going to have R UE botox in March.   Currently in Pain? Yes   Pain Score 5    Pain Location Back   Pain Orientation Right   Pain Descriptors / Indicators Constant;Tightness;Burning;Sore;Pressure   Pain Type Chronic pain   Pain Onset More than a month ago   Pain Frequency Constant   Multiple Pain Sites No                    [facility] Adult PT Treatment/Exercise - 08/30/14 1220    Lumbar Exercises: Stretches   Single Knee to Chest Stretch 3 reps;30 seconds   Double Knee to Chest Stretch 3 reps;30 seconds   Lower Trunk Rotation 3 reps;20 seconds   Lower Trunk Rotation Limitations limited by pain when rotating LE to right side   Pelvic Tilt Other  (comment)  10 reps Anterior to posterior   Lumbar Exercises: Seated   Other Seated Lumbar Exercises seated Anterior/Posterior pelvic tilt x 10 with assist of PTA with sheet at lumbar area   Lumbar Exercises: Supine   Bridge 10 reps;3 seconds   Knee/Hip Exercises: Stretches   Active Hamstring Stretch 3 reps;30 seconds;Other (comment)  seated on edge of mat                PT Education - 08/30/14 1224    Education provided Yes   Education Details HEP, difference between stretch and pain, performing exercises within tolerance   Person(s) Educated Patient   Methods Explanation;Demonstration;Verbal cues;Handout   Comprehension Verbalized understanding;Need further instruction          PT Short Term Goals - 08/20/14 1053    PT SHORT TERM GOAL #1   Title Obtain consult for new RLE AFO if approved by [facility] case manager.   Baseline 09-18-14   Time 4   Period Weeks   Status New   PT SHORT TERM GOAL #2   Title Pt. will report at least 25% improvement in low back pain/tightness after performing HEP in AM.  Baseline rates pain 5/10 at time of eval   Time 4   Period Weeks   Status New   PT SHORT TERM GOAL #3   Title Improve TUG score to </= 12.5 with cane for decr. fall risk   Baseline 13.63 secs with cane   Time 4   Period Weeks   Status New   PT SHORT TERM GOAL #4   Title Incr. gait velocity to >/= 3.0 ft/sec with cane with S for incr. gait efficiency.   Baseline 2.76 ft/sec with cane   Time 4   Period Weeks   Status New           PT Long Term Goals - 08/20/14 1057    PT LONG TERM GOAL #1   Title Report at least 30% decrease in right trunk and low back pain/discomfort due to tightness and spasticity.   Baseline 10-18-14                      pain 5/10 on 08-20-14   Time 8   Period Weeks   Status New   PT LONG TERM GOAL #2   Title Independently don and doff AFO for RLE in new AFO is obtained   Baseline 10-18-14   Time 8   Period Weeks   Status New   PT LONG TERM GOAL  #3   Title Increase gait velocity to >/= 3.2 ft/sec with new AFO with cane.   Baseline 10-18-14                           2.76 ft/sec with cane   Time 8   Status New   PT LONG TERM GOAL #4   Title Independent in HEP for stretching and strengthening.   Baseline 10-18-14   Time 8   Period Weeks   Status New   PT LONG TERM GOAL #5   Title Improve TUG score to </= 11.5 secs with cane for decr. fall risk.   Baseline 10-18-14   Time 8   Period Weeks   Status New               Plan - 08/30/14 1229    Clinical Impression Statement Pt reports feeling better after treatment today with less back pain.  Appears motivated to improve gait and mobility to "walk normal".  Pt aware of abnormal gait pattern.   Pt will benefit from skilled therapeutic intervention in order to improve on the following deficits Abnormal gait;Decreased coordination;Impaired flexibility;Impaired tone;Decreased balance;Decreased strength;Decreased mobility;Decreased range of motion   Rehab Potential Good   PT Frequency 2x / week   PT Duration 4 weeks   PT Treatment/Interventions ADLs/Self Care Home Management;Therapeutic activities;Patient/family education;Therapeutic exercise;Gait training;Balance training;Stair training;Neuromuscular re-education;Functional mobility training;Other (comment)   PT Next Visit Plan Review HEP.  Obtain copy of order for AFO if patient brings it in.  Contact case manager for approval for new AFO.  Schedule more visits.   PT Home Exercise Plan stretching and strengthening   Consulted and Agree with Plan of Care Patient        Problem List Patient Active Problem List   Diagnosis Date Noted  . Right spastic hemiparesis 08/22/2014  . 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 Neurogenic bladder 06/20/2014  . Low BP 03/22/2014  . Depression 03/09/2014  . Anxiety 03/09/2014  . Smoker 03/09/2014  . Closed fracture of C5-C7 level  with incomplete spinal cord  lesion     Newell CoralRobertson, Denise Terry 08/30/2014, 12:33 PM  [facility] [address] [place], [place], [address] Phone: [phone]   Fax:  [phone]     Newell CoralDenise Terry Robertson, [place]PTA [facility] 08/30/2014 12:33 PM Phone: [phone] Fax: [phone]

## 2014-08-30 NOTE — Telephone Encounter (Signed)
Pt called asking about what he can do to get his methadone rx. I reviewed his chart and conveyed your message from his 08/22/2014 visit, in which you stated that he is not going to receive narcotics until a clean UDS is obtained. I also added that you stated that he should get his methadone from his PCP. At this point he said he does not know what to do about his PCP and was wondering if he could come in and give a urine specimen  Before his next appt with you on 10/01/2014. He says that during his last visit he was forthright and honest about admitting use of marijuana and admitted that it was a momentary lapse of judgement.

## 2014-08-31 NOTE — Telephone Encounter (Signed)
And i was forthright with him at his last visit, that i would not rx any narcs until we had a clean UDS---if he hasn't smoked for a month, he could come in for a UDS----if it's then clean we can rx.

## 2014-09-03 ENCOUNTER — Telehealth: Payer: Self-pay | Admitting: Family Medicine

## 2014-09-03 NOTE — Telephone Encounter (Signed)
Pt called requesting medication refill on methadone (DOLOPHINE) 10 MG tablet. This would be the last refill with PCP. Please f/u with pt

## 2014-09-03 NOTE — Telephone Encounter (Signed)
Pt called requesting medication refill on methadone (DOLOPHINE) 10 MG tablet. This would be the last refill with PCP. Please f/u with pt            

## 2014-09-04 ENCOUNTER — Telehealth: Payer: Self-pay | Admitting: *Deleted

## 2014-09-04 ENCOUNTER — Other Ambulatory Visit: Payer: Self-pay | Admitting: Family Medicine

## 2014-09-04 MED ORDER — METHADONE HCL 10 MG PO TABS: 20.0000 mg | ORAL_TABLET | Freq: Two times a day (BID) | ORAL | Status: DC

## 2014-09-04 NOTE — Telephone Encounter (Signed)
Pt is asking to see if his methadone (DOLOPHINE) 10 MG tablet is ready for pick up.

## 2014-09-04 NOTE — Telephone Encounter (Signed)
Patient called and left message asking for us to return the call... He has some question about a recent medication that was prescribed.  Did not state which med it is.

## 2014-09-04 NOTE — Telephone Encounter (Signed)
Pt is returning phone call , please f/u with pt

## 2014-09-04 NOTE — Telephone Encounter (Signed)
Returned his call-he says-"thats ok I got it solved" and will be seeing Dr Riley KillSwartz on 10/01/14

## 2014-09-04 NOTE — Telephone Encounter (Signed)
Called patient back. He explained the situation and was honest about her THC use prohibiting him from getting methadone or narcs from pain management No longer using THC.   Plan: Refill methadone, LAST time from PCP.  Patient agrees with plan and voices understanding.

## 2014-09-04 NOTE — Telephone Encounter (Signed)
Pt was notified per Dr Armen PickupFunches [facility] Methadone was not going to be refill  Pt was advised to schedule appointment with [facility]

## 2014-09-06 ENCOUNTER — Ambulatory Visit: Payer: Worker's Compensation | Admitting: Physical Therapy

## 2014-09-06 DIAGNOSIS — R29898 Other symptoms and signs involving the musculoskeletal system: Secondary | ICD-10-CM

## 2014-09-06 DIAGNOSIS — M62838 Other muscle spasm: Secondary | ICD-10-CM

## 2014-09-06 DIAGNOSIS — M6249 Contracture of muscle, multiple sites: Secondary | ICD-10-CM | POA: Diagnosis not present

## 2014-09-07 ENCOUNTER — Encounter: Payer: Self-pay | Admitting: Physical Therapy

## 2014-09-07 NOTE — Patient Instructions (Addendum)
Hamstring Stretch   Reach down along right leg until a comfortable stretch is felt in back of thigh. Be sure to keep knee straight. Hold ____ seconds. Repeat ____ times per set. Do ____ sets per session. Do ____ sessions per day.  http://orth.exer.us/156   Copyright  VHI. All rights reserved.  Gastroc / Heel Cord Stretch - On Step   Stand with heels over edge of stair. Holding rail, lower heels until stretch is felt in calf of legs. Repeat ___ times. Do ___ times per day.  Copyright  VHI. All rights reserved.  (Clinic) Knee: Flexion / Hamstring Curl - Prone   Pulley beyond feet, lie with strap around left ankle. Pull foot toward buttocks. Repeat _10_ times per set. Do __3__ sets per session. Do _10___ sessions per week. Use ____ lb weights.  DO THIS IN SEATED POSITION WITH GREEN THERABAND  Copyright  VHI. All rights reserved.

## 2014-09-07 NOTE — Therapy (Signed)
[facility] [address] [place], [place], [address] Phone: [phone]   Fax:  [phone]  Physical Therapy Treatment  Patient Details  Name: Billy Kelley MRN: [id number] Date of Birth: [birth year] Referring Provider:  Lora Paula, MD  Encounter Date: 09/06/2014      PT End of Session - 09/07/14 0929    Visit Number 3   Number of Visits 10   Date for PT Re-Evaluation 09/18/14   Authorization Type Worker's comp   Authorization - Visit Number 3   Authorization - Number of Visits 10   PT Start Time 1016   PT Stop Time 1115   PT Time Calculation (min) 59 min      Past Medical History  Diagnosis Date  . 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 Kidney stone 2012    passed with fluids     Past Surgical History  Procedure Laterality Date  . Spinal cord fusion  2005     C4-C6   . Hernia repair Bilateral 1976  . Brain surgery      There were no vitals taken for this visit.  Visit Diagnosis:  Muscle spasticity  Weakness of right leg      Subjective Assessment - 09/07/14 0921    Symptoms Pt. reports much back pain this am due to tightness; has script from Dr. Riley Kill for new R AFO   Pertinent History  increased spasticity in R hand with fingers flexed - may be getting Botox injection; C4-C6 fusion due to C5 SCI with [name] syndrome on 12-25-03 after falling off roof while working in [place]   Patient Stated Goals obtain new AFO to improve walking; learn exercises to do at gym   Currently in Pain? Yes   Pain Score 5    Pain Location Back   Pain Orientation Right   Pain Descriptors / Indicators Tightness;Sore;Aching;Constant   Pain Type Chronic pain   Pain Onset More than a month ago   Multiple Pain Sites No                    [facility] Adult PT Treatment/Exercise - 09/07/14 0001    Knee/Hip Exercises:  Stretches   Active Hamstring Stretch 3 reps;30 seconds;Other (comment)  seated on edge of mat   Knee/Hip Exercises: Aerobic   Stationary Bike Scifit level 1.5 x 5"     TherEx: stretching in quadriped position - leaning straight back onto heels 30 sec hold x 2 reps; shifting hips to R heel With hands moved over toward L side for more intense stretch on R lateral trunk - 30 sec hold x 3 reps; using blue Swiss ball and pushing it forward with pt. In seated position for stretching back musculature x 45 sec hold x 1 rep; rotated Ball to left side for R lateral trunk stretching x 30 sec hold Pt. Performed seated R hamstring stretch with leg up on stool in front - 30 sec hold x 2 reps Standing R heel cord stretch with 3" block x 30 sec hold x 2 reps Seated R knee flexion with green theraband x 15 reps SciFit level 1.5 x 5" with UE's and LE's   Moist heat applied to back (thoracic & lumbar) regions x 15" - pt. Lying supine on moist heat pack       PT Education - 09/07/14 6962  Education provided Yes   Education Details Pt. given hamstring stretch seated, heel cord stretch in standing with 3" block, runner's stretch and seated R knee flexion with green theraband (1 yd given)   Person(s) Educated Patient   Methods Explanation;Demonstration;Handout   Comprehension Verbalized understanding;Returned demonstration          PT Short Term Goals - 08/20/14 1053    PT SHORT TERM GOAL #1   Title Obtain consult for new RLE AFO if approved by [facility] Production designer, theatre/television/filmmanager.   Baseline 09-18-14   Time 4   Period Weeks   Status New   PT SHORT TERM GOAL #2   Title Pt. will report at least 25% improvement in low back pain/tightness after performing HEP in AM.   Baseline rates pain 5/10 at time of eval   Time 4   Period Weeks   Status New   PT SHORT TERM GOAL #3   Title Improve TUG score to </= 12.5 with cane for decr. fall risk   Baseline 13.63 secs with cane   Time 4   Period Weeks   Status New   PT  SHORT TERM GOAL #4   Title Incr. gait velocity to >/= 3.0 ft/sec with cane with S for incr. gait efficiency.   Baseline 2.76 ft/sec with cane   Time 4   Period Weeks   Status New           PT Long Term Goals - 08/20/14 1057    PT LONG TERM GOAL #1   Title Report at least 30% decrease in right trunk and low back pain/discomfort due to tightness and spasticity.   Baseline 10-18-14                      pain 5/10 on 08-20-14   Time 8   Period Weeks   Status New   PT LONG TERM GOAL #2   Title Independently don and doff AFO for RLE in new AFO is obtained   Baseline 10-18-14   Time 8   Period Weeks   Status New   PT LONG TERM GOAL #3   Title Increase gait velocity to >/= 3.2 ft/sec with new AFO with cane.   Baseline 10-18-14                           2.76 ft/sec with cane   Time 8   Status New   PT LONG TERM GOAL #4   Title Independent in HEP for stretching and strengthening.   Baseline 10-18-14   Time 8   Period Weeks   Status New   PT LONG TERM GOAL #5   Title Improve TUG score to </= 11.5 secs with cane for decr. fall risk.   Baseline 10-18-14   Time 8   Period Weeks   Status New               Plan - 09/07/14 0930    Clinical Impression Statement Pt. stated back felt much better after stretching and heat applied at end of PT session; pt would benefit from new AFO (toe off) to allow greater flexibility in R ankle   Pt will benefit from skilled therapeutic intervention in order to improve on the following deficits Abnormal gait;Decreased coordination;Impaired flexibility;Impaired tone;Decreased balance;Decreased strength;Decreased mobility;Decreased range of motion   Rehab Potential Good   PT Frequency 2x / week   PT Duration 4 weeks  PT Treatment/Interventions ADLs/Self Care Home Management;Therapeutic activities;Patient/family education;Therapeutic exercise;Gait training;Balance training;Stair training;Neuromuscular re-education;Functional mobility training;Other (comment)    PT Next Visit Plan cont. with stretching; emailed Dr. Riley Kill informing him of need of his notes and order for AFO after talking with case manager; no PT documentation required per her report   PT Home Exercise Plan stretching and strengthening   Consulted and Agree with Plan of Care Patient        Problem List Patient Active Problem List   Diagnosis Date Noted  . Right spastic hemiparesis 08/22/2014  . 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 Neurogenic bladder 06/20/2014  . Low BP 03/22/2014  . Depression 03/09/2014  . Anxiety 03/09/2014  . Smoker 03/09/2014  . Closed fracture of C5-C7 level with incomplete spinal cord lesion     Lititia Sen, Donavan Burnet, PT 09/07/2014, 9:37 AM  [facility] [address] [place], [place], [address] Phone: [phone]   Fax:  [phone]

## 2014-09-10 ENCOUNTER — Telehealth: Payer: Self-pay | Admitting: Physical Medicine & Rehabilitation

## 2014-09-10 ENCOUNTER — Ambulatory Visit: Payer: Worker's Compensation | Admitting: Physical Therapy

## 2014-09-10 DIAGNOSIS — G8111 Spastic hemiplegia affecting right dominant side: Secondary | ICD-10-CM

## 2014-09-10 NOTE — Telephone Encounter (Signed)
Right AFO rx written

## 2014-09-10 NOTE — Telephone Encounter (Signed)
Have attempted to fax records and notes and order to CM with workman comp 3x today without success.  Mailing the records

## 2014-09-17 ENCOUNTER — Telehealth: Payer: Self-pay | Admitting: *Deleted

## 2014-09-17 ENCOUNTER — Encounter: Payer: Self-pay | Admitting: Physical Therapy

## 2014-09-17 ENCOUNTER — Ambulatory Visit: Payer: Worker's Compensation | Attending: Physical Medicine & Rehabilitation | Admitting: Physical Therapy

## 2014-09-17 DIAGNOSIS — R29898 Other symptoms and signs involving the musculoskeletal system: Secondary | ICD-10-CM | POA: Insufficient documentation

## 2014-09-17 DIAGNOSIS — M6249 Contracture of muscle, multiple sites: Secondary | ICD-10-CM | POA: Diagnosis present

## 2014-09-17 DIAGNOSIS — R269 Unspecified abnormalities of gait and mobility: Secondary | ICD-10-CM

## 2014-09-17 NOTE — Therapy (Signed)
[facility] [address] [place], [place], [address] Phone: [phone]   Fax:  [phone]  Physical Therapy Treatment  Patient Details  Name: Billy BuggyScott Kelley MRN: [id number] Date of Birth: [birth year] Referring Provider:  Dessa PhiFunches, Josalyn, MD  Encounter Date: 09/17/2014      PT End of Session - 09/17/14 1853    Visit Number 4   Number of Visits 10   Date for PT Re-Evaluation 09/18/14   Authorization Type Worker's comp   Authorization - Visit Number 4   Authorization - Number of Visits 10   PT Start Time 1148   PT Stop Time 1230   PT Time Calculation (min) 42 min      Past Medical History  Diagnosis Date  . 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 Kidney stone 2012    passed with fluids     Past Surgical History  Procedure Laterality Date  . Spinal cord fusion  2005     C4-C6   . Hernia repair Bilateral 1976  . Brain surgery      There were no vitals taken for this visit.  Visit Diagnosis:  Abnormality of gait      Subjective Assessment - 09/17/14 1848    Symptoms Pt. states he had appt. with [facility] but has to cancel it because they have not received the authorization from [facility]; Dr. Riley KillSwartz has mailed script and notes but still no authorization received in order for him to get new AFO   Pertinent History  increased spasticity in R hand with fingers flexed - may be getting Botox injection; C4-C6 fusion due to C5 SCI with [name] syndrome on 12-25-03 after falling off roof [place] working in [place]   Patient Stated Goals obtain new AFO to improve walking; learn exercises to do at gym   Currently in Pain? Yes   Pain Score 4    Pain Location Back   Pain Orientation Right   Pain Descriptors / Indicators Tightness;Constant;Sore;Aching;Discomfort   Pain Type Chronic pain   Pain Onset More than a month ago   Pain  Frequency Constant   Pain Relieving Factors stretches help   Multiple Pain Sites No       Self care; discussed appropriate AFO's - pt. Was shown toe off AFO, blue rocker AFO and Ottobock Walk-On  AFO for RLE; reviewed HEP for stretching; discussed benefits of aquatic exercise  Orthotic training; pt. Donned toe off AFO on RLE - pt. Gait trained 100' with toe off AFO without device with no LOB Trialed Ottobock walk-on AFO on RLE due to medial placement of upright - pt. Has bony prominence on lateral foot which Was pressed by toe off AFO resulting in some discomfort; Ottobock walk-on not supportive enough for pt.; toe off AFO Is more appropriate and minimizes gait deviations and improves safety with gait - pt. States this brace is very comfortable   and feels that he has much more flexibility and AROM with brace worn                       PT Short Term Goals - 08/20/14 1053    PT SHORT TERM GOAL #1   Title Obtain consult for new RLE AFO if approved [facility].   Baseline 09-18-14   Time 4   Period Weeks  Status New   PT SHORT TERM GOAL #2   Title Pt. will report at least 25% improvement in low back pain/tightness after performing HEP in AM.   Baseline rates pain 5/10 at time of eval   Time 4   Period Weeks   Status New   PT SHORT TERM GOAL #3   Title Improve TUG score to </= 12.5 with cane for decr. fall risk   Baseline 13.63 secs with cane   Time 4   Period Weeks   Status New   PT SHORT TERM GOAL #4   Title Incr. gait velocity to >/= 3.0 ft/sec with cane with S for incr. gait efficiency.   Baseline 2.76 ft/sec with cane   Time 4   Period Weeks   Status New           PT Long Term Goals - 08/20/14 1057    PT LONG TERM GOAL #1   Title Report at least 30% decrease in right trunk and low back pain/discomfort due to tightness and spasticity.   Baseline 10-18-14                      pain 5/10 on 08-20-14   Time 8   Period Weeks   Status New   PT LONG  TERM GOAL #2   Title Independently don and doff AFO for RLE in new AFO is obtained   Baseline 10-18-14   Time 8   Period Weeks   Status New   PT LONG TERM GOAL #3   Title Increase gait velocity to >/= 3.2 ft/sec with new AFO with cane.   Baseline 10-18-14                           2.76 ft/sec with cane   Time 8   Status New   PT LONG TERM GOAL #4   Title Independent in HEP for stretching and strengthening.   Baseline 10-18-14   Time 8   Period Weeks   Status New   PT LONG TERM GOAL #5   Title Improve TUG score to </= 11.5 secs with cane for decr. fall risk.   Baseline 10-18-14   Time 8   Period Weeks   Status New               Plan - 09/17/14 1853    Clinical Impression Statement Pt's gait is much improved with use of toe off AFO; pt. has incr. R ankle flexibiltiy and incr. clearance in swing phase of gait   Pt will benefit from skilled therapeutic intervention in order to improve on the following deficits Abnormal gait;Decreased coordination;Impaired flexibility;Impaired tone;Decreased balance;Decreased strength;Decreased mobility;Decreased range of motion   Rehab Potential Good   PT Frequency 2x / week   PT Duration 4 weeks   PT Treatment/Interventions ADLs/Self Care Home Management;Therapeutic activities;Patient/family education;Therapeutic exercise;Gait training;Balance training;Stair training;Neuromuscular re-education;Functional mobility training;Other (comment)   PT Next Visit Plan Pt. will be placed on hold due to limited # of visits authorized - until new AFO is obtained and then resume PT with orthotic training   PT Home Exercise Plan stretching and strengthening   Recommended Other Services toe off AFO for RLE   Consulted and Agree with Plan of Care Patient        Problem List Patient Active Problem List   Diagnosis Date Noted  . Right spastic hemiparesis 08/22/2014  . Brown-Sequard syndrome at C5  level of cervical spinal cord 06/20/2014  . Neurogenic bladder  06/20/2014  . Low BP 03/22/2014  . Depression 03/09/2014  . Anxiety 03/09/2014  . Smoker 03/09/2014  . Closed fracture of C5-C7 level with incomplete spinal cord lesion     Mattalynn Crandle, Donavan Burnet, PT 09/17/2014, 6:58 PM  [facility] [address] [place], [place], [address] Phone: [phone]   Fax:  [phone]

## 2014-09-17 NOTE — Telephone Encounter (Signed)
Called about his brace.  We have been having trouble getting the fax through we had sent it several time, successful transmission. The case worker for this patient is very rude.  Everything has been straightened out and all information was recd by the W/C carrier

## 2014-09-18 ENCOUNTER — Encounter: Payer: Self-pay | Admitting: Physical Medicine & Rehabilitation

## 2014-09-27 ENCOUNTER — Telehealth: Payer: Self-pay | Admitting: *Deleted

## 2014-09-27 NOTE — Telephone Encounter (Signed)
[facility] and they are ready to approve and pay for the AFO but is inquiring about price.  I told her this was the office of the MD that place the order but we have nothing to do with the actual brace and would need to contact [facility] .  She requested the PT.  I gave her the number [facility] for the physical therapist.

## 2014-10-01 ENCOUNTER — Encounter: Payer: Worker's Compensation | Attending: Registered Nurse | Admitting: Physical Medicine & Rehabilitation

## 2014-10-01 ENCOUNTER — Encounter: Payer: Self-pay | Admitting: Physical Medicine & Rehabilitation

## 2014-10-01 VITALS — BP 111/55 | HR 99 | Resp 14

## 2014-10-01 DIAGNOSIS — S14145S Brown-Sequard syndrome at C5 level of cervical spinal cord, sequela: Secondary | ICD-10-CM | POA: Insufficient documentation

## 2014-10-01 DIAGNOSIS — N319 Neuromuscular dysfunction of bladder, unspecified: Secondary | ICD-10-CM

## 2014-10-01 DIAGNOSIS — G8111 Spastic hemiplegia affecting right dominant side: Secondary | ICD-10-CM | POA: Insufficient documentation

## 2014-10-01 DIAGNOSIS — G8110 Spastic hemiplegia affecting unspecified side: Secondary | ICD-10-CM

## 2014-10-01 DIAGNOSIS — F1290 Cannabis use, unspecified, uncomplicated: Secondary | ICD-10-CM | POA: Insufficient documentation

## 2014-10-01 DIAGNOSIS — F419 Anxiety disorder, unspecified: Secondary | ICD-10-CM | POA: Insufficient documentation

## 2014-10-01 DIAGNOSIS — S12400S Unspecified displaced fracture of fifth cervical vertebra, sequela: Secondary | ICD-10-CM

## 2014-10-01 DIAGNOSIS — F329 Major depressive disorder, single episode, unspecified: Secondary | ICD-10-CM | POA: Insufficient documentation

## 2014-10-01 DIAGNOSIS — K592 Neurogenic bowel, not elsewhere classified: Secondary | ICD-10-CM

## 2014-10-01 DIAGNOSIS — F32A Depression, unspecified: Secondary | ICD-10-CM

## 2014-10-01 DIAGNOSIS — S14155S Other incomplete lesion at C5 level of cervical spinal cord, sequela: Secondary | ICD-10-CM

## 2014-10-01 MED ORDER — AMITRIPTYLINE HCL 50 MG PO TABS: 25.0000 mg | ORAL_TABLET | Freq: Every day | ORAL | Status: DC

## 2014-10-01 MED ORDER — METHADONE HCL 10 MG PO TABS: 20.0000 mg | ORAL_TABLET | Freq: Two times a day (BID) | ORAL | Status: DC

## 2014-10-01 MED ORDER — LINACLOTIDE 145 MCG PO CAPS: 145.0000 ug | ORAL_CAPSULE | Freq: Every day | ORAL | Status: DC

## 2014-10-01 NOTE — Patient Instructions (Signed)
CONTINUE WITH YOUR SAME BOWEL MEDICINES ONCE YOU START THE LINZESS.  PLEASE CALL ME WITH ANY PROBLEMS OR QUESTIONS [phone]).

## 2014-10-01 NOTE — Progress Notes (Signed)
Subjective:    Patient ID: Billy Kelley, male    DOB: 03/16/1976, 39 y.o.   MRN: [id number]  HPI   Billy Kelley is back regarding his SCI and associated functional deficits and pain. He was supposed to have botox injections today, but his WC has not approved. He is doing better with his bowels, but still develops low back pain prior to having a BM in the morning. He is using a laxative as well as a morning enema which has helped his regularity. He is receiving his methadone thru his PCP still.  He is with his mother today who remains supportive. He has taken a brief hiatus from therapies awaiting his AFO and botox injections. He is doing a HEP currently.     Pain Inventory Average Pain 6 Pain Right Now 6 My pain is constant and sharp  In the last 24 hours, has pain interfered with the following? General activity 5 Relation with others 5 Enjoyment of life 5 What TIME of day is your pain at its worst? morning Sleep (in general) Fair  Pain is worse with: walking, bending and standing Pain improves with: medication Relief from Meds: 6  Mobility use a cane how many minutes can you walk? 20 ability to climb steps?  yes do you drive?  yes  Function disabled: date disabled .  Neuro/Psych bowel control problems weakness numbness trouble walking spasms depression anxiety  Prior Studies Any changes since last visit?  no  Physicians involved in your care Any changes since last visit?  no   Family History  Problem Relation Age of Onset  . Hypertension Mother   . Hyperlipidemia Mother   . Cancer Mother 38    endometrial   . Heart disease Father     CHF   . Diabetes Neg Hx    History   Social History  . Marital Status: Single    Spouse Name: N/A  . Number of Children: 1   . Years of Education: N/A   Occupational History  . Disability      [employer]    Social History Main Topics  . Smoking status: Current Every Day Smoker -- 0.50 packs/day for 20 years    Types:  Cigarettes  . Smokeless tobacco: Never Used  . Alcohol Use: No  . Drug Use: Yes    Special: Marijuana     Comment: 2-3 times week   . Sexual Activity: Yes    Birth Control/ Protection: Condom   Other Topics Concern  . None   Social History Narrative   Lives with mom.   Moved from [place] most recently. Also live in [place] in [place].          Past Surgical History  Procedure Laterality Date  . Spinal cord fusion  2005     C4-C6   . Hernia repair Bilateral 1976  . Brain surgery     Past Medical History  Diagnosis Date  . 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 Kidney stone 2012    passed with fluids    BP 111/55 mmHg  Pulse 99  Resp 14  SpO2 99%  Opioid Risk Score:   Fall Risk Score: High Fall Risk (>13 points) (educated previously and given handout)`1  Depression screen PHQ 2/9  Depression screen [facility] 2/9 10/01/2014 04/12/2014 04/12/2014 03/22/2014  Decreased Interest 1 3 - 2  Down, Depressed, Hopeless 1 2 0 2  PHQ - 2 Score 2 5 0 4  Altered sleeping 1 2 - 1  Tired, decreased energy 2 3 - 2  Change in appetite 2 2 - 3  Feeling bad or failure about yourself  2 2 - 1  Trouble concentrating 1 2 - 2  Moving slowly or fidgety/restless 0 1 - 0  Suicidal thoughts 1 2 - 2  PHQ-9 Score 11 19 - 15     Review of Systems  Constitutional: Positive for appetite change.  Gastrointestinal: Positive for constipation.  Musculoskeletal: Positive for gait problem.       Spasms  Neurological: Positive for weakness and numbness.  Psychiatric/Behavioral: Positive for dysphoric mood. The patient is nervous/anxious.   All other systems reviewed and are negative.      Objective:   Physical Exam  General: Alert and oriented x 3, No apparent distress  HEENT: Head is normocephalic, atraumatic, PERRLA, EOMI, sclera anicteric, oral mucosa pink and moist, dentition intact, ext ear canals clear,  Neck:  Supple without JVD or lymphadenopathy  Heart: Reg rate and rhythm. No murmurs rubs or gallops  Chest: CTA bilaterally without wheezes, rales, or rhonchi; no distress  Abdomen: Soft, non-tender, non-distended, bowel sounds positive.  Extremities: No clubbing, cyanosis, or edema. Pulses are 2+  Skin: Clean and intact without signs of breakdown  Neuro: Alert and oriented x3. CN exam intact. 3+ to 4/5 deltoid, bicep. Trace wrist and elbow extension, HI, 1/5 grip. Flexor contractures of the right fingers and wrists are still noted. Underlying tone of 3/4. Decreased LT,PP 1+/2 RUE. LUE nearly 5/5 prox to distal. Normal sensory LUE. RLE: 2 HF, HE, 2- KE and KF, trace ADF/eversion, absent PF. Tone at ankle is 3-4/4. Fluctuating tone knee at 2-3/4. Pt's LLE: trace to 0/2 sensation. Strength grossly 4 to 4+/5. Pt ambulates using his straight cane and employs a steppage gait pattern to help clear the right foot---improved. Truncal sensation 1/2 left side. Right trunk sensation nearly normal. May have had some muscle spasm in the abdominal musculature.  Musculoskeletal: less sensitivity to touch in the abdomen, some pain with deeper palpation over the rib cage and the thoracic and lumbar paraspinals. Fairly good pelvic symmetry while standing. Minimal pain with palpation of the low back and pelvis.  Psych: Pt's affect was generally appropriate. A little anxious but improved.   Assessment & Plan:   1. C5 fx/SCI with Billy Kelley Sequard Presentation  2. Spastic right hemiparesis  3. Neurogenic bowel  4. Reactive depression with anxiety, ?PTSD     Plan:  1. Will begin a trial on linzess to help with bowel activity and regularity. Continue lax and enemeez  2. UDS today  3. Increase elavil to 50mg  qhs for sleep and nerve pain  4. Methadone rx was provided today as he only has a week left..  5. Continue with neuro-rehab to work on ROM, spasticity, mobility, orthotic evaluation.  -new AFO per [facility] is pending    -awaiting Botox to right wrist and finger flexors---300units---perhaps phenol at some point?  -may need to also consider tendon lengthening of the RUE. 6. Same baclofen for now.   klonopin to 0.5mg  q8.  7.30 minutes of face to face patient care time were spent during this visit. All questions were encouraged and answered.

## 2014-10-02 ENCOUNTER — Other Ambulatory Visit: Payer: Self-pay | Admitting: Registered Nurse

## 2014-10-03 LAB — PMP ALCOHOL METABOLITE (ETG): ETGU: NEGATIVE ng/mL

## 2014-10-06 LAB — BENZODIAZEPINES (GC/LC/MS), URINE
Alprazolam metabolite (GC/LC/MS), ur confirm: NEGATIVE ng/mL (ref <25)
Clonazepam metabolite (GC/LC/MS), ur confirm: 134 ng/mL (ref <25)
FLURAZEPAMU: NEGATIVE ng/mL (ref <50)
Lorazepam (GC/LC/MS), ur confirm: NEGATIVE ng/mL (ref <50)
Midazolam (GC/LC/MS), ur confirm: NEGATIVE ng/mL (ref <50)
Nordiazepam (GC/LC/MS), ur confirm: NEGATIVE ng/mL (ref <50)
Oxazepam (GC/LC/MS), ur confirm: NEGATIVE ng/mL (ref <50)
Temazepam (GC/LC/MS), ur confirm: NEGATIVE ng/mL (ref <50)
Triazolam metabolite (GC/LC/MS), ur confirm: NEGATIVE ng/mL (ref <50)

## 2014-10-06 LAB — METHADONE (GC/LC/MS), URINE
EDDPUC: 4719 ng/mL (ref <100)
Methadone (GC/LC/MS), ur confirm: 2681 ng/mL (ref <100)

## 2014-10-08 ENCOUNTER — Telehealth: Payer: Self-pay | Admitting: *Deleted

## 2014-10-08 NOTE — Telephone Encounter (Signed)
Randa EvensJoanne called back to say that they will work with [facility] for the orthotic for [place].  She would like to get that under way and get quotes started for the brace.  Her fax number is [phone].  Can you follow up on this with [facility]?

## 2014-10-09 ENCOUNTER — Ambulatory Visit: Payer: Self-pay | Admitting: Physical Medicine & Rehabilitation

## 2014-10-09 LAB — PRESCRIPTION MONITORING PROFILE (SOLSTAS)
Amphetamine/Meth: NEGATIVE ng/mL
Barbiturate Screen, Urine: NEGATIVE ng/mL
Buprenorphine, Urine: NEGATIVE ng/mL
CANNABINOID SCRN UR: NEGATIVE ng/mL
CARISOPRODOL, URINE: NEGATIVE ng/mL
Cocaine Metabolites: NEGATIVE ng/mL
Creatinine, Urine: 72.09 mg/dL (ref >20.0)
Fentanyl, Ur: NEGATIVE ng/mL
MDMA URINE: NEGATIVE ng/mL
MEPERIDINE UR: NEGATIVE ng/mL
Nitrites, Initial: NEGATIVE ug/mL
OXYCODONE SCRN UR: NEGATIVE ng/mL
Opiate Screen, Urine: NEGATIVE ng/mL
PH URINE, INITIAL: 5.5 pH (ref 4.5–8.9)
Propoxyphene: NEGATIVE ng/mL
TAPENTADOLUR: NEGATIVE ng/mL
Tramadol Scrn, Ur: NEGATIVE ng/mL
Zolpidem, Urine: NEGATIVE ng/mL

## 2014-10-10 ENCOUNTER — Other Ambulatory Visit: Payer: Self-pay | Admitting: Physical Medicine & Rehabilitation

## 2014-10-15 ENCOUNTER — Other Ambulatory Visit: Payer: Self-pay | Admitting: Physical Medicine & Rehabilitation

## 2014-10-17 NOTE — Progress Notes (Signed)
Urine drug screen for this encounter is consistent for prescribed medication 

## 2014-10-29 ENCOUNTER — Ambulatory Visit: Payer: Self-pay | Admitting: Registered Nurse

## 2014-10-30 ENCOUNTER — Encounter: Payer: Worker's Compensation | Attending: Registered Nurse | Admitting: Physical Medicine & Rehabilitation

## 2014-10-30 ENCOUNTER — Encounter: Payer: Self-pay | Admitting: Physical Medicine & Rehabilitation

## 2014-10-30 VITALS — BP 106/68 | HR 120 | Resp 14

## 2014-10-30 DIAGNOSIS — F329 Major depressive disorder, single episode, unspecified: Secondary | ICD-10-CM

## 2014-10-30 DIAGNOSIS — S14145S Brown-Sequard syndrome at C5 level of cervical spinal cord, sequela: Secondary | ICD-10-CM

## 2014-10-30 DIAGNOSIS — S12400S Unspecified displaced fracture of fifth cervical vertebra, sequela: Secondary | ICD-10-CM

## 2014-10-30 DIAGNOSIS — F1290 Cannabis use, unspecified, uncomplicated: Secondary | ICD-10-CM | POA: Diagnosis not present

## 2014-10-30 DIAGNOSIS — G8111 Spastic hemiplegia affecting right dominant side: Secondary | ICD-10-CM | POA: Diagnosis not present

## 2014-10-30 DIAGNOSIS — G8110 Spastic hemiplegia affecting unspecified side: Secondary | ICD-10-CM | POA: Diagnosis not present

## 2014-10-30 DIAGNOSIS — S14155S Other incomplete lesion at C5 level of cervical spinal cord, sequela: Secondary | ICD-10-CM

## 2014-10-30 DIAGNOSIS — F419 Anxiety disorder, unspecified: Secondary | ICD-10-CM | POA: Diagnosis not present

## 2014-10-30 DIAGNOSIS — N319 Neuromuscular dysfunction of bladder, unspecified: Secondary | ICD-10-CM

## 2014-10-30 DIAGNOSIS — F32A Depression, unspecified: Secondary | ICD-10-CM

## 2014-10-30 MED ORDER — METHADONE HCL 10 MG PO TABS: 20.0000 mg | ORAL_TABLET | Freq: Two times a day (BID) | ORAL | Status: DC

## 2014-10-30 MED ORDER — CLONAZEPAM 0.5 MG PO TABS: 0.5000 mg | ORAL_TABLET | Freq: Three times a day (TID) | ORAL | Status: DC

## 2014-10-30 NOTE — Progress Notes (Signed)
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 This includes bleeding, bruising, infection, excessive weakness, or medication side effects. A REMS form is on file and signed.  Needle: 50mm injectable monopolar needle electrode  Number of units per muscle Pectoralis Major 0 units Pectoralis Minor 0 units Biceps 0 units Brachioradialis 0 units FCR 0 units FCU 0 units FDS 150 units FDP 150 units FPL 0 units Pronator Teres 0 units Pronator Quadratus 0 units All injections were done after obtaining appropriate EMG activity and after negative drawback for blood. The patient tolerated the procedure well. Post procedure instructions were given. A followup appointment was made.

## 2014-10-30 NOTE — Patient Instructions (Signed)
PLEASE CALL ME WITH ANY PROBLEMS OR QUESTIONS (#[phone]).      

## 2014-11-07 ENCOUNTER — Other Ambulatory Visit: Payer: Self-pay | Admitting: Family Medicine

## 2014-11-08 ENCOUNTER — Other Ambulatory Visit: Payer: Self-pay | Admitting: Physical Medicine & Rehabilitation

## 2014-11-10 ENCOUNTER — Other Ambulatory Visit: Payer: Self-pay | Admitting: Physical Medicine & Rehabilitation

## 2014-11-12 ENCOUNTER — Ambulatory Visit: Payer: Self-pay | Admitting: Physical Medicine & Rehabilitation

## 2014-11-21 ENCOUNTER — Other Ambulatory Visit: Payer: Self-pay | Admitting: Physical Medicine & Rehabilitation

## 2014-11-21 ENCOUNTER — Encounter: Payer: Self-pay | Admitting: Registered Nurse

## 2014-11-21 ENCOUNTER — Telehealth: Payer: Self-pay | Admitting: *Deleted

## 2014-11-21 ENCOUNTER — Encounter: Payer: Worker's Compensation | Attending: Registered Nurse | Admitting: Registered Nurse

## 2014-11-21 VITALS — BP 102/68 | HR 88 | Resp 14

## 2014-11-21 DIAGNOSIS — F419 Anxiety disorder, unspecified: Secondary | ICD-10-CM | POA: Diagnosis not present

## 2014-11-21 DIAGNOSIS — G8110 Spastic hemiplegia affecting unspecified side: Secondary | ICD-10-CM | POA: Diagnosis not present

## 2014-11-21 DIAGNOSIS — S14145S Brown-Sequard syndrome at C5 level of cervical spinal cord, sequela: Secondary | ICD-10-CM

## 2014-11-21 DIAGNOSIS — F1290 Cannabis use, unspecified, uncomplicated: Secondary | ICD-10-CM | POA: Diagnosis not present

## 2014-11-21 DIAGNOSIS — F32A Depression, unspecified: Secondary | ICD-10-CM

## 2014-11-21 DIAGNOSIS — N319 Neuromuscular dysfunction of bladder, unspecified: Secondary | ICD-10-CM

## 2014-11-21 DIAGNOSIS — S12400S Unspecified displaced fracture of fifth cervical vertebra, sequela: Secondary | ICD-10-CM | POA: Diagnosis not present

## 2014-11-21 DIAGNOSIS — G8111 Spastic hemiplegia affecting right dominant side: Secondary | ICD-10-CM | POA: Diagnosis not present

## 2014-11-21 DIAGNOSIS — S14155S Other incomplete lesion at C5 level of cervical spinal cord, sequela: Secondary | ICD-10-CM

## 2014-11-21 DIAGNOSIS — Z5181 Encounter for therapeutic drug level monitoring: Secondary | ICD-10-CM

## 2014-11-21 DIAGNOSIS — F329 Major depressive disorder, single episode, unspecified: Secondary | ICD-10-CM | POA: Diagnosis not present

## 2014-11-21 DIAGNOSIS — K592 Neurogenic bowel, not elsewhere classified: Secondary | ICD-10-CM

## 2014-11-21 MED ORDER — METHADONE HCL 10 MG PO TABS: 20.0000 mg | ORAL_TABLET | Freq: Two times a day (BID) | ORAL | Status: DC

## 2014-11-21 NOTE — Progress Notes (Signed)
Subjective:    Patient ID: Billy Kelley, male    DOB: 04/27/1976, 39 y.o.   MRN: [id number]  HPI: Billy Kelley is a 39 year old male who returns for follow up for chronic pain and medication refill. He says his pain is located in his right arm. He rates his pain 8. His current exercise regime is walking and performing stretching exercises.  S/P Botox injection with some relief noted.  Also stating when he awakes every morning he has excruciating intensity of lower back pain on his right side it is relieved after he had a good bowel movement. Also states he's following his bowel program daily.  He became tearful when describing his pain and stated " he doesn't want to live like this". He denies suicidial thoughts or plan. At this time he denies any pain in his lower back. He was instructed to call his PCP Billy Kelley obtain a full GI workup he verbalizes understanding.   Mother in room all questions answered.  Billy Kelley called at 2:32 to inform Billy Kelley he has an appointment with his PCP on 11/23/14.  Pain Inventory Average Pain 8 Pain Right Now 8 My pain is constant, burning and stabbing  In the last 24 hours, has pain interfered with the following? General activity 8 Relation with others 8 Enjoyment of life 10 What TIME of day is your pain at its worst? morning Sleep (in general) Fair  Pain is worse with: walking and standing Pain improves with: medication Relief from Meds: 7  Mobility walk without assistance walk with assistance use a cane how many minutes can you walk? 15 ability to climb steps?  yes do you drive?  yes  Function not employed: date last employed . disabled: date disabled . I need assistance with the following:  meal prep  Neuro/Psych bowel control problems spasms anxiety  Prior Studies Any changes since last visit?  no  Physicians involved in your care Any changes since last visit?  no   Family History  Problem Relation Age of Onset   . Hypertension Mother   . Hyperlipidemia Mother   . Cancer Mother [id number]    endometrial   . Heart disease Father     CHF   . Diabetes Neg Hx    History   Social History  . Marital Status: Single    Spouse Name: N/A  . Number of Children: 1   . Years of Education: N/A   Occupational History  . Disability      [employer]    Social History Main Topics  . Smoking status: Current Every Day Smoker -- 0.50 packs/day for 20 years    Types: Cigarettes  . Smokeless tobacco: Never Used  . Alcohol Use: No  . Drug Use: Yes    Special: Marijuana     Comment: 2-3 times week   . Sexual Activity: Yes    Birth Control/ Protection: Condom   Other Topics Concern  . None   Social History Narrative   Lives with mom.   [place]oved from [place] recently. Also live in [place]   Son in [place].          Past Surgical History  Procedure Laterality Date  . Spinal cord fusion  2005     C4-C6   . Hernia repair Bilateral 1976  . Brain surgery     Past Medical History  Diagnosis Date  . 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 Kidney stone 2012    passed with fluids    BP 102/68 mmHg  Pulse 88  Resp 14  SpO2 97%  Opioid Risk Score:   Fall Risk Score: Low Fall Risk (0-5 points)`1  Depression screen PHQ 2/9  [facility] 2/9 10/01/2014 04/12/2014 04/12/2014 03/22/2014  Decreased Interest 1 3 - 2  Down, Depressed, Hopeless 1 2 0 2  PHQ - 2 Score 2 5 0 4  Altered sleeping 1 2 - 1  Tired, decreased energy 2 3 - 2  Change in appetite 2 2 - 3  Feeling bad or failure about yourself  2 2 - 1  Trouble concentrating 1 2 - 2  Moving slowly or fidgety/restless 0 1 - 0  Suicidal thoughts 1 2 - 2  PHQ-9 Score 11 19 - 15     Review of Systems  Gastrointestinal: Positive for abdominal pain.       Bowel discomfort  Neurological:       Spasms  Psychiatric/Behavioral: The patient is nervous/anxious.   All other  systems reviewed and are negative.      Objective:   Physical Exam  Constitutional: He is oriented to person, place, and time. He appears well-developed and well-nourished.  HENT:  Head: Normocephalic and atraumatic.  Neck: Normal range of motion. Neck supple.  Cardiovascular: Normal rate and regular rhythm.   Pulmonary/Chest: Effort normal and breath sounds normal.  Abdominal: Soft. Bowel sounds are normal. He exhibits no distension. There is no tenderness. There is no rebound and no guarding.  Musculoskeletal:  Normal Muscle Bulk and Muscle Testing Reveals: Upper Extremities:Left: Full ROM and Muscle Strength 5/5 Right: Decreased ROM 90 Degrees and Muscle Strength 2/5. Unable to Extend Back without spinal or paraspinal tenderness  Lower Extremities: Left: Full ROM and Muscle Strength 5/5 Right: Decreased ROM/ Extension Muscle Spasticity occurs/ Right Leg Brace Intact Arises from chair slowly/ using straight cane for support Antalgic Gait    Neurological: He is alert and oriented to person, place, and time.  Skin: Skin is warm and dry.  Psychiatric: He has a normal mood and affect.  Nursing note and vitals reviewed.         Assessment & Plan:  1. C5 fx/SCI with [name] Presentation: Refilled: Methadone 10 mg two tablets every 12 hours #120 Continue Gabapentin for nerve Pain 2. Spastic right hemiparesis: Continue Baclofen and Klonopin 3. Neurogenic bowel: Continue with Bowel Regimen 4. Reactive depression with anxiety, ?PTSD 5. Insomnia: Contimue Elavil 6. Abdominal Pain/ F/U PCP 11/23/14  30 minutes of face to face patient care time was spent during this visit. All questions were encouraged and answered.  F/U in 1 month

## 2014-11-21 NOTE — Telephone Encounter (Signed)
Patient called and left message that he has scheduled appointment with his PCP for Friday 11/23/14 @ 2:30

## 2014-11-23 ENCOUNTER — Ambulatory Visit: Payer: Self-pay | Admitting: Family Medicine

## 2014-11-26 ENCOUNTER — Ambulatory Visit: Payer: Self-pay | Admitting: Family Medicine

## 2014-12-17 ENCOUNTER — Ambulatory Visit: Payer: Worker's Compensation | Admitting: Physical Therapy

## 2014-12-19 ENCOUNTER — Encounter: Payer: Self-pay | Admitting: Family Medicine

## 2014-12-19 ENCOUNTER — Ambulatory Visit (HOSPITAL_COMMUNITY)
Admission: RE | Admit: 2014-12-19 | Discharge: 2014-12-19 | Disposition: A | Discharge status: Home / Self Care | Payer: Medicare Other | Source: Ambulatory Visit | Attending: Family Medicine | Admitting: Family Medicine

## 2014-12-19 ENCOUNTER — Ambulatory Visit (HOSPITAL_BASED_OUTPATIENT_CLINIC_OR_DEPARTMENT_OTHER): Payer: Medicare Other | Admitting: Family Medicine

## 2014-12-19 VITALS — BP 115/72 | HR 95 | Temp 98.0°F | Resp 16 | Ht 74.0 in | Wt 190.2 lb

## 2014-12-19 DIAGNOSIS — R5383 Other fatigue: Secondary | ICD-10-CM

## 2014-12-19 DIAGNOSIS — M47896 Other spondylosis, lumbar region: Secondary | ICD-10-CM | POA: Insufficient documentation

## 2014-12-19 DIAGNOSIS — M545 Low back pain, unspecified: Secondary | ICD-10-CM

## 2014-12-19 DIAGNOSIS — M5032 Other cervical disc degeneration, mid-cervical region: Secondary | ICD-10-CM | POA: Insufficient documentation

## 2014-12-19 DIAGNOSIS — M62838 Other muscle spasm: Secondary | ICD-10-CM

## 2014-12-19 DIAGNOSIS — M6248 Contracture of muscle, other site: Secondary | ICD-10-CM

## 2014-12-19 DIAGNOSIS — J309 Allergic rhinitis, unspecified: Secondary | ICD-10-CM | POA: Insufficient documentation

## 2014-12-19 LAB — CBC
HCT: 41.4 % (ref 39.0–52.0)
Hemoglobin: 14.6 g/dL (ref 13.0–17.0)
MCH: 29.4 pg (ref 26.0–34.0)
MCHC: 35.3 g/dL (ref 30.0–36.0)
MCV: 83.3 fL (ref 78.0–100.0)
MPV: 9.9 fL (ref 8.6–12.4)
PLATELETS: 254 10*3/uL (ref 150–400)
RBC: 4.97 MIL/uL (ref 4.22–5.81)
RDW: 14.1 % (ref 11.5–15.5)
WBC: 6.4 10*3/uL (ref 4.0–10.5)

## 2014-12-19 LAB — COMPLETE METABOLIC PANEL WITH GFR
ALBUMIN: 4.4 g/dL (ref 3.5–5.2)
ALK PHOS: 72 U/L (ref 39–117)
ALT: 21 U/L (ref 0–53)
AST: 18 U/L (ref 0–37)
BUN: 13 mg/dL (ref 6–23)
CHLORIDE: 106 meq/L (ref 96–112)
CO2: 29 meq/L (ref 19–32)
Calcium: 9.6 mg/dL (ref 8.4–10.5)
Creat: 0.82 mg/dL (ref 0.50–1.35)
GFR, Est Non African American: >89 mL/min
Glucose, Bld: 78 mg/dL (ref 70–99)
Potassium: 4.4 mEq/L (ref 3.5–5.3)
SODIUM: 141 meq/L (ref 135–145)
TOTAL PROTEIN: 6.7 g/dL (ref 6.0–8.3)
Total Bilirubin: 0.4 mg/dL (ref 0.2–1.2)

## 2014-12-19 LAB — POCT URINALYSIS DIPSTICK
Bilirubin, UA: NEGATIVE
GLUCOSE UA: NEGATIVE
KETONES UA: NEGATIVE
LEUKOCYTES UA: NEGATIVE
Nitrite, UA: NEGATIVE
PH UA: 6
Protein, UA: NEGATIVE
RBC UA: NEGATIVE
Spec Grav, UA: 1.01
UROBILINOGEN UA: 0.2

## 2014-12-19 LAB — TSH: TSH: 2.423 u[IU]/mL (ref 0.350–4.500)

## 2014-12-19 LAB — VITAMIN B12: Vitamin B-12: 670 pg/mL (ref 211–911)

## 2014-12-19 MED ORDER — CETIRIZINE HCL 10 MG PO TABS: 10.0000 mg | ORAL_TABLET | Freq: Every day | ORAL | Status: DC

## 2014-12-19 NOTE — Assessment & Plan Note (Signed)
Fatigue: checking CBC, vit D, vit B12 Start daily zyrtec for sings of allergies

## 2014-12-19 NOTE — Progress Notes (Signed)
   Subjective:    Patient ID: Marquis BuggyScott Kadlec, male    DOB: [birth year], 39 y.o.   MRN: [id number] CC: neck pain, back pain, fatigue  HPI 39 yo M with chronic pain from fall resulting in cervical spinal cord lesion on methadone presents for f/u:  1. Neck pain: pain and stiffness daily for past 2 months. Worse with movement. No recent trauma. Pain is diffuse. Pain dose not radiate to shoulder or down arms. No increase weakness or contracture of arms or hands. No fever, chills, new medications.   2. R low back pain: R low back pain every morning for past 2-3 months. Last for 2 hrs. Does not radiate. Sharp, deep pains. No improvement with laxative. Bowels or soft. No nausea, emesis, diarrhea, dysuria, hematuria. No recent trauma.   3. Fatigue: every morning feels tired for past 2 months. Sleeps well. Does not store. Stopped PM klonopin but this has not helped. Denies depression. Fatigue is distressing but it intrferes with quality time with his son.   Soc Hx: current smoker  Review of Systems  Constitutional: Positive for fatigue. Negative for fever, chills, diaphoresis, activity change, appetite change and unexpected weight change.  HENT: Positive for sinus pressure. Negative for congestion.   Musculoskeletal: Positive for myalgias, back pain, gait problem, neck pain and neck stiffness. Negative for joint swelling.  Neurological: Positive for headaches.       Objective:   Physical Exam BP 115/72 mmHg  Pulse 95  Temp(Src) 98 F (36.7 C)  Resp 16  Ht 6\' 2"  (1.88 m)  Wt 190 lb 3.2 oz (86.274 kg)  BMI 24.41 kg/m2  SpO2 100% General appearance: alert, cooperative and no distress  Head: Normocephalic, without obvious abnormality, atraumatic Eyes: conjunctivae/corneas clear. PERRL, EOM's intact.  Ears: normal TM's and external ear canals both ears Nose: Nares normal. Septum midline. Mucosa normal. No drainage or sinus tenderness., no discharge, turbinates pink, swollen Throat: lips,  mucosa, and tongue normal; teeth and gums normal Neck: no adenopathy, supple, symmetrical, trachea midline and thyroid not enlarged, symmetric, no tenderness/mass/nodules, decrease lateral rotation b/l due to pain. Pain with flexion and extension.  Back: symmetric, no curvature. ROM normal. No CVA tenderness. pain with left rotation in R lumbar paraspinal   UA: reviewed and normal      Assessment & Plan:

## 2014-12-19 NOTE — Progress Notes (Signed)
Patient complains of lower right sided back pain for the past couple of months Patient states not sure if it is related to his partial paralysis on that side Patient does take medications to help with his bowel movements Has not had any x rays or US to that area Patient also complains of having a stiff neck for the past two months

## 2014-12-19 NOTE — Assessment & Plan Note (Signed)
R low back pain: I suspect pinched or irritated nerve. Normal UA today. Checking x-ray of low back and abdomen. check liver and kidneys on CMP.

## 2014-12-19 NOTE — Assessment & Plan Note (Signed)
zyrtec added for seasonal allergies

## 2014-12-19 NOTE — Assessment & Plan Note (Signed)
1. Neck pain: muscle stiffness, x-rays ordered, be sure to sleep on back with hands at sides as this is the most spine neutral position.

## 2014-12-19 NOTE — Patient Instructions (Addendum)
Billy Kelley,  Thank you for coming in today.  1. Neck pain: muscle stiffness, x-rays ordered, be sure to sleep on back with hands at sides as this is the most spine neutral position.   2. R low back pain: I suspect pinched or irritated nerve. Normal UA today. Checking x-ray of low back and abdomen. check liver and kidneys on CMP.   3. Fatigue: checking CBC, vit D, vit B12 Start daily zyrtec for sings of allergies    You will be called with labs results  F/u in 6 weeks for neck pain, R low back pain and fatigue  Dr. Armen PickupFunches

## 2014-12-20 ENCOUNTER — Telehealth: Payer: Self-pay | Admitting: Family Medicine

## 2014-12-20 LAB — VITAMIN D 25 HYDROXY (VIT D DEFICIENCY, FRACTURES): VIT D 25 HYDROXY: 31 ng/mL (ref 30–100)

## 2014-12-20 MED ORDER — VITAMIN D3 50 MCG (2000 UT) PO TABS: 2000.0000 [IU] | ORAL_TABLET | Freq: Every day | ORAL | Status: DC

## 2014-12-20 NOTE — Telephone Encounter (Signed)
Called patient Reviewed x-rays, neck, back, abdomen, not revealing.  Reviewed labs all normal except for low normal vit D level recommended D3 2000 IU daily   Recommended zyrtec for fatigue

## 2014-12-24 ENCOUNTER — Other Ambulatory Visit: Payer: Self-pay | Admitting: Physical Medicine & Rehabilitation

## 2014-12-24 ENCOUNTER — Other Ambulatory Visit: Payer: Self-pay | Admitting: *Deleted

## 2014-12-24 ENCOUNTER — Telehealth: Payer: Self-pay | Admitting: *Deleted

## 2014-12-24 NOTE — Telephone Encounter (Signed)
No additional refills will be given if patient cancels next appointment

## 2014-12-24 NOTE — Telephone Encounter (Signed)
Recd electronic refill request for Baclofen.  Patient does not have a scheduled follow up appt. Sent in electronically with NO refills - patient must make and keep next appointment for any future refills

## 2014-12-26 ENCOUNTER — Telehealth: Payer: Self-pay | Admitting: Family Medicine

## 2014-12-26 NOTE — Telephone Encounter (Signed)
Patients mother came into facility stating that the patient is still in pain during the morning, with increased fatigue.She would like to talk to pt's PCP. Please f/u

## 2014-12-27 NOTE — Telephone Encounter (Signed)
Called back and spoke to patient.  He is still having abdominal pain Simethicone is helping with gas but he is taking 125 mg 4 times a day.  Recommendation: Add calcium carbonate to the simethicone take 125-250 mg of simethicone at a time

## 2015-01-01 ENCOUNTER — Ambulatory Visit: Payer: Worker's Compensation | Admitting: Physical Therapy

## 2015-01-02 ENCOUNTER — Encounter: Payer: Self-pay | Admitting: Registered Nurse

## 2015-01-02 ENCOUNTER — Encounter: Payer: Worker's Compensation | Attending: Registered Nurse | Admitting: Registered Nurse

## 2015-01-02 VITALS — BP 100/62 | HR 94 | Resp 14

## 2015-01-02 DIAGNOSIS — S12400S Unspecified displaced fracture of fifth cervical vertebra, sequela: Secondary | ICD-10-CM | POA: Insufficient documentation

## 2015-01-02 DIAGNOSIS — Z79899 Other long term (current) drug therapy: Secondary | ICD-10-CM | POA: Diagnosis not present

## 2015-01-02 DIAGNOSIS — Z5181 Encounter for therapeutic drug level monitoring: Secondary | ICD-10-CM

## 2015-01-02 DIAGNOSIS — S14155S Other incomplete lesion at C5 level of cervical spinal cord, sequela: Secondary | ICD-10-CM | POA: Insufficient documentation

## 2015-01-02 DIAGNOSIS — S14145S Brown-Sequard syndrome at C5 level of cervical spinal cord, sequela: Secondary | ICD-10-CM | POA: Diagnosis not present

## 2015-01-02 DIAGNOSIS — F419 Anxiety disorder, unspecified: Secondary | ICD-10-CM | POA: Diagnosis not present

## 2015-01-02 DIAGNOSIS — G8110 Spastic hemiplegia affecting unspecified side: Secondary | ICD-10-CM | POA: Diagnosis not present

## 2015-01-02 DIAGNOSIS — G8111 Spastic hemiplegia affecting right dominant side: Secondary | ICD-10-CM

## 2015-01-02 DIAGNOSIS — F32A Depression, unspecified: Secondary | ICD-10-CM

## 2015-01-02 DIAGNOSIS — N319 Neuromuscular dysfunction of bladder, unspecified: Secondary | ICD-10-CM | POA: Insufficient documentation

## 2015-01-02 DIAGNOSIS — K592 Neurogenic bowel, not elsewhere classified: Secondary | ICD-10-CM | POA: Diagnosis not present

## 2015-01-02 DIAGNOSIS — F329 Major depressive disorder, single episode, unspecified: Secondary | ICD-10-CM | POA: Insufficient documentation

## 2015-01-02 MED ORDER — METHADONE HCL 10 MG PO TABS: 20.0000 mg | ORAL_TABLET | Freq: Two times a day (BID) | ORAL | Status: DC

## 2015-01-02 MED ORDER — CLONAZEPAM 0.5 MG PO TABS: 0.5000 mg | ORAL_TABLET | Freq: Three times a day (TID) | ORAL | Status: DC

## 2015-01-02 NOTE — Progress Notes (Signed)
Subjective:    Patient ID: Davaris Youtsey, male    DOB: 01-25-1976, 39 y.o.   MRN: [id number]  HPI: Mr. Jshawn Hurta is a 39 year old male who returns for follow up for chronic pain and medication refill. He says his pain is located in his right arm. He rates his pain 8. His current exercise regime is walking and performing stretching exercises.   Pain Inventory Average Pain 7 Pain Right Now 7 My pain is intermittent, sharp, dull and stabbing  In the last 24 hours, has pain interfered with the following? General activity 4 Relation with others 4 Enjoyment of life 4 What TIME of day is your pain at its worst? morning Sleep (in general) Fair  Pain is worse with: walking Pain improves with: rest and medication Relief from Meds: 3  Mobility use a cane how many minutes can you walk? 10 ability to climb steps?  yes do you drive?  yes  Function disabled: date disabled .  Neuro/Psych bowel control problems trouble walking spasms  Prior Studies Any changes since last visit?  no  Physicians involved in your care Any changes since last visit?  no   Family History  Problem Relation Age of Onset  . Hypertension Mother   . Hyperlipidemia Mother   . Cancer Mother 25    endometrial   . Heart disease Father     CHF   . Diabetes Neg Hx    History   Social History  . Marital Status: Single    Spouse Name: N/A  . Number of Children: 1   . Years of Education: N/A   Occupational History  . Disability      [employer]    Social History Main Topics  . Smoking status: Current Every Day Smoker -- 0.50 packs/day for 20 years    Types: Cigarettes  . Smokeless tobacco: Never Used  . Alcohol Use: No  . Drug Use: Yes    Special: Marijuana     Comment: 2-3 times week   . Sexual Activity: Yes    Birth Control/ Protection: Condom   Other Topics Concern  . None   Social History Narrative   Lives with mom.   Moved from [place] most recently. Also live in [place] in  [place].          Past Surgical History  Procedure Laterality Date  . Spinal cord fusion  2005     C4-C6   . Hernia repair Bilateral 1976  . Brain surgery     Past Medical History  Diagnosis Date  . 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 Kidney stone 2012    passed with fluids    BP 100/62 mmHg  Pulse 94  Resp 14  SpO2 99%  Opioid Risk Score:   Fall Risk Score: Moderate Fall Risk (6-13 points)`1  Depression screen PHQ 2/9  Depression screen [facility] 2/9 10/01/2014 04/12/2014 04/12/2014 03/22/2014  Decreased Interest 1 3 - 2  Down, Depressed, Hopeless 1 2 0 2  PHQ - 2 Score 2 5 0 4  Altered sleeping 1 2 - 1  Tired, decreased energy 2 3 - 2  Change in appetite 2 2 - 3  Feeling bad or failure about yourself  2 2 - 1  Trouble concentrating 1 2 - 2  Moving slowly or fidgety/restless 0 1 - 0  Suicidal  thoughts 1 2 - 2  PHQ-9 Score 11 19 - 15     Review of Systems  Constitutional: Negative.   HENT: Negative.   Eyes: Negative.   Respiratory: Negative.   Cardiovascular: Negative.   Gastrointestinal: Positive for abdominal pain.       Bowel control problems  Endocrine: Negative.   Genitourinary: Negative.   Musculoskeletal: Positive for myalgias, back pain and arthralgias.  Skin: Negative.   Allergic/Immunologic: Negative.   Neurological:       Trouble walking  Hematological: Negative.   Psychiatric/Behavioral: Negative.        Objective:   Physical Exam  Constitutional: He is oriented to person, place, and time. He appears well-developed and well-nourished.  HENT:  Head: Normocephalic and atraumatic.  Neck: Normal range of motion. Neck supple.  Cardiovascular: Normal rate and regular rhythm.   Pulmonary/Chest: Effort normal and breath sounds normal.  Musculoskeletal:  Normal Muscle Bulk and Muscle Testing Reveals: Upper Extremities: Left:Full ROM and Muscle Strength 5/5 Right:  Decreased ROM 90 Degrees and Muscle Strength 2/5. Unable to Extend Back without spinal or paraspinal tenderness Lower Extremities: Left: Full ROM and Muscle Strength 5/5 Right: Decreased ROM and Muscle Strength 4/5 AFO intact. Arises from chair slowly using straight cane for support Wide Based Gait  Neurological: He is alert and oriented to person, place, and time.  Skin: Skin is warm and dry.  Psychiatric: He has a normal mood and affect.  Nursing note and vitals reviewed.         Assessment & Plan:  1. C5 fx/SCI with [name] Presentation: Refilled: Methadone 10 mg two tablets every 12 hours #120. Second script given to accommodate scheduled appointment. Continue Gabapentin for nerve Pain 2. Spastic right hemiparesis: Continue Baclofen 3. Neurogenic bowel: Continue with Bowel Regimen 4. Reactive depression with anxiety, ?PTSD. Continue Cymbalta and Klonopin 5. Insomnia: Continue Elavil  20 minutes of face to face patient care time was spent during this visit. All questions were encouraged and answered.  F/U in 1 month

## 2015-01-03 ENCOUNTER — Encounter: Payer: Self-pay | Admitting: Physical Therapy

## 2015-01-03 ENCOUNTER — Ambulatory Visit: Payer: Worker's Compensation | Attending: Physical Medicine & Rehabilitation | Admitting: Physical Therapy

## 2015-01-03 DIAGNOSIS — M546 Pain in thoracic spine: Secondary | ICD-10-CM | POA: Insufficient documentation

## 2015-01-03 DIAGNOSIS — R29898 Other symptoms and signs involving the musculoskeletal system: Secondary | ICD-10-CM

## 2015-01-03 DIAGNOSIS — R269 Unspecified abnormalities of gait and mobility: Secondary | ICD-10-CM | POA: Diagnosis present

## 2015-01-03 DIAGNOSIS — M6249 Contracture of muscle, multiple sites: Secondary | ICD-10-CM | POA: Diagnosis not present

## 2015-01-03 DIAGNOSIS — M62838 Other muscle spasm: Secondary | ICD-10-CM

## 2015-01-03 NOTE — Therapy (Signed)
[facility] [address] [place], [place], [address] Phone: [phone]   Fax:  [phone]  Physical Therapy Evaluation  Patient Details  Name: Billy Kelley MRN: [id number] Date of Birth: March 31, 1976 Referring Provider:  Ranelle Oyster, MD  Encounter Date: 01/03/2015      PT End of Session - 01/03/15 1148    Visit Number 5   Number of Visits 10   Date for PT Re-Evaluation 02/02/15   Authorization Type Worker's comp   Authorization - Visit Number 5   Authorization - Number of Visits 10   PT Start Time 0845   PT Stop Time 0932   PT Time Calculation (min) 47 min      Past Medical History  Diagnosis Date  . 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 Kidney stone 2012    passed with fluids     Past Surgical History  Procedure Laterality Date  . Spinal cord fusion  2005     C4-C6   . Hernia repair Bilateral 1976  . Brain surgery      There were no vitals filed for this visit.  Visit Diagnosis:  Abnormality of gait - Plan: PT plan of care cert/re-cert  Muscle spasticity - Plan: PT plan of care cert/re-cert  Weakness of right leg - Plan: PT plan of care cert/re-cert  Right-sided thoracic back pain - Plan: PT plan of care cert/re-cert      Subjective Assessment - 01/03/15 1131    Subjective Pt. reports he received toe off AFO from [employer] approx. 1 month ago; had to purchase new shoes new to brace not fitting comfortably in his old shoes. Pt. states he has gotten used to the AFO now - states he had to figure out a way to be able to safely negotiate steps.  He was previously seen for 4 visits at this facility from Feb. - March and was placed on hold until AFO was obtained.  Pt. states that since March he has been having significantly R sided back pain that has not been diagnosed - states he is undergoing tests and has appt. with GI  MD coming up, states colonoscopy will probably need to be scheduled; states that this pain is different from previous pain/discomfort he was experiencing due to trunk tightness                                                                                                                                                                                  Pertinent History  increased spasticity in R hand with  fingers flexed - received Botox injection about 2 months ago; C4-C6 fusion due to C5 SCI with [employer] syndrome on 12-25-03 after falling off roof while working in [place]   Patient Stated Goals goal for this admission is to decrease back pain and incr. trunk/core stability; obtain updated HEP with stretches for RLE as appropriate   Currently in Pain? Yes   Pain Score 9    Pain Location Back  R side - states he feels like it is an organ under his ribs   Pain Orientation Right   Pain Descriptors / Indicators Sharp;Shooting;Stabbing;Grimacing   Pain Type Chronic pain   Pain Onset More than a month ago   Pain Frequency Intermittent   Aggravating Factors  no specific factors   Pain Relieving Factors states it is relieved approx. 1/2 hour after having a BM but pain returns the next day   Multiple Pain Sites No            [facility] PT Assessment - 01/03/15 1141    Assessment   Medical Diagnosis C5 SCI with [name] syndrome   Onset Date/Surgical Date 12/25/03   Next MD Visit --  to be scheduled - GI appt needed for further testing for bac   Prior Therapy --  4 visits at this facility from Feb.- March 2016   Precautions   Required Braces or Orthoses Other Brace/Splint  new AFO for RLE obtained a month ago from [facility]   Has the patient fallen in the past 6 months No   Has the patient had a decrease in activity level because of a fear of falling?  No   Is the patient reluctant to leave their home because of a fear of falling?  No   Home Environment   Living Environment  Private residence   Type of Home Apartment   Home Access Stairs to enter   Entrance Stairs-Number of Steps 12   Home Layout Other (Comment)  lives in apt. on 2nd floor    Prior Function   Level of Independence Independent with homemaking with ambulation   Strength   Right Hip Flexion 3-/5   Right Hip Extension 4/5   Right Hip ABduction 2+/5   Right Knee Flexion 2-/5   Right Knee Extension 4/5   Right Ankle Dorsiflexion 1/5   Right Ankle Plantar Flexion 1/5   Right Ankle Inversion 0/5   Right Ankle Eversion 0/5   Ambulation/Gait   Ambulation/Gait Yes   Ambulation/Gait Assistance 6: Modified independent (Device/Increase time)   Ambulation Distance (Feet) 175 Feet   Assistive device --  none   Gait Pattern Decreased hip/knee flexion - right;Right flexed knee in stance;Ataxic   Ambulation Surface Level;Indoor   Gait velocity 2.95  no device used   Stairs Yes   Stairs Assistance 6: Modified independent (Device/Increase time)   Stair Management Technique One rail Right;Alternating pattern   Number of Stairs 4   Height of Stairs 6   Timed Up and Go Test   Normal TUG (seconds) 11.87  no device used                           PT Education - 01/03/15 1147    Education provided Yes   Education Details pt. given handout for low back packet - stretching - and hamstring, heel cord and runner's stretch in standing   Person(s) Educated Patient   Methods Explanation;Demonstration;Handout   Comprehension  Verbalized understanding;Returned demonstration          PT Short Term Goals - 08/20/14 1053    PT SHORT TERM GOAL #1   Title Obtain consult for new RLE AFO if approved by [facility] case manager.   Baseline 09-18-14   Time 4   Period Weeks   Status New   PT SHORT TERM GOAL #2   Title Pt. will report at least 25% improvement in low back pain/tightness after performing HEP in AM.   Baseline rates pain 5/10 at time of eval   Time 4   Period Weeks   Status New   PT  SHORT TERM GOAL #3   Title Improve TUG score to </= 12.5 with cane for decr. fall risk   Baseline 13.63 secs with cane   Time 4   Period Weeks   Status New   PT SHORT TERM GOAL #4   Title Incr. gait velocity to >/= 3.0 ft/sec with cane with S for incr. gait efficiency.   Baseline 2.76 ft/sec with cane   Time 4   Period Weeks   Status New           PT Long Term Goals - 01/03/15 1155    PT LONG TERM GOAL #1   Title Report at least 30% decrease in right trunk and low back pain/discomfort due to tightness and spasticity.  02-02-15   Baseline pt reports he is now having a different type of pain - rates intensity 9/10; revise LTG to pain intensity to </5/10 for incr. comfort and ease with mobility   Time 4   Period Weeks   Status Revised   PT LONG TERM GOAL #2   Title Independently don and doff AFO for RLE in new AFO is obtained    02-02-15   Status Achieved   PT LONG TERM GOAL #3   Title Increase gait velocity to >/= 3.1 ft/sec with new AFO without cane.      02-02-15   Baseline 2.95 ft/sec without cane   Time 4   Period Weeks   Status Revised   PT LONG TERM GOAL #4   Title Independent in HEP for stretching and strengthening.      02-02-15   Baseline 02-02-15   Time 4   Period Weeks   Status On-going   PT LONG TERM GOAL #5   Title Improve TUG score to </= 11.5 secs with cane for decr. fall risk.  Revise LTG to </= 11.0 secs without cane   (02-02-15)   Baseline 11.87 without cane   Status Revised               Plan - 01/03/15 1149    Clinical Impression Statement New AFO significantly improves gait with pt. now safely to amb. without use of cane; mobility is now limited by new onset of R sided back pain which pt. reports started about 2 months ago; etiology of this pain has not been determined; pt. states he is going to schedule appt. with GI MD for possible colonoscopy to be scheduled; mobility is limited by this pain - this pain does not appear to be due to muscle  tightness/spasticity but is more localized - I informed pt that visits would be better utilized after definitive diagnosis for this back pain has been determined as it is difficult for pt to move comfortably and without excessive c/o pain  Pt will benefit from skilled therapeutic intervention in order to improve on the following deficits Abnormal gait;Decreased coordination;Impaired flexibility;Impaired tone;Decreased balance;Decreased strength;Decreased mobility;Decreased range of motion   Rehab Potential Good   PT Frequency 1x / week   PT Duration 4 weeks   PT Treatment/Interventions ADLs/Self Care Home Management;Therapeutic activities;Patient/family education;Therapeutic exercise;Gait training;Balance training;Stair training;Neuromuscular re-education;Functional mobility training;Other (comment)   PT Next Visit Plan trunk/core strengthening and orthotic training; addres back pain prn   PT Home Exercise Plan stretching and strengthening   Consulted and Agree with Plan of Care Patient         Problem List Patient Active Problem List   Diagnosis Date Noted  . Right low back pain 12/19/2014  . Fatigue 12/19/2014  . Neck muscle spasm 12/19/2014  . Allergic rhinitis 12/19/2014  . Neurogenic bowel 10/01/2014  . Right spastic hemiparesis 08/22/2014  . 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 Neurogenic bladder 06/20/2014  . Low BP 03/22/2014  . Depression 03/09/2014  . Anxiety 03/09/2014  . Smoker 03/09/2014  . Closed fracture of C5-C7 level with incomplete spinal cord lesion     Aleisa Howk, Donavan Burnet, PT 01/03/2015, 12:10 PM  [facility] [address] [place], [place], [address] Phone: [phone]   Fax:  [phone]

## 2015-01-17 ENCOUNTER — Other Ambulatory Visit: Payer: Self-pay | Admitting: Physical Medicine & Rehabilitation

## 2015-02-04 ENCOUNTER — Encounter: Payer: Self-pay | Admitting: Registered Nurse

## 2015-02-04 ENCOUNTER — Telehealth: Payer: Self-pay | Admitting: *Deleted

## 2015-02-04 ENCOUNTER — Encounter: Payer: Worker's Compensation | Attending: Registered Nurse | Admitting: Registered Nurse

## 2015-02-04 VITALS — BP 114/78 | HR 88 | Resp 14

## 2015-02-04 DIAGNOSIS — Z79899 Other long term (current) drug therapy: Secondary | ICD-10-CM

## 2015-02-04 DIAGNOSIS — S14145S Brown-Sequard syndrome at C5 level of cervical spinal cord, sequela: Secondary | ICD-10-CM | POA: Diagnosis not present

## 2015-02-04 DIAGNOSIS — S12400S Unspecified displaced fracture of fifth cervical vertebra, sequela: Secondary | ICD-10-CM

## 2015-02-04 DIAGNOSIS — F329 Major depressive disorder, single episode, unspecified: Secondary | ICD-10-CM | POA: Insufficient documentation

## 2015-02-04 DIAGNOSIS — N319 Neuromuscular dysfunction of bladder, unspecified: Secondary | ICD-10-CM | POA: Diagnosis not present

## 2015-02-04 DIAGNOSIS — F1290 Cannabis use, unspecified, uncomplicated: Secondary | ICD-10-CM | POA: Insufficient documentation

## 2015-02-04 DIAGNOSIS — G8110 Spastic hemiplegia affecting unspecified side: Secondary | ICD-10-CM

## 2015-02-04 DIAGNOSIS — F419 Anxiety disorder, unspecified: Secondary | ICD-10-CM | POA: Diagnosis not present

## 2015-02-04 DIAGNOSIS — S14155S Other incomplete lesion at C5 level of cervical spinal cord, sequela: Secondary | ICD-10-CM

## 2015-02-04 DIAGNOSIS — G8111 Spastic hemiplegia affecting right dominant side: Secondary | ICD-10-CM

## 2015-02-04 DIAGNOSIS — Z5181 Encounter for therapeutic drug level monitoring: Secondary | ICD-10-CM

## 2015-02-04 DIAGNOSIS — K592 Neurogenic bowel, not elsewhere classified: Secondary | ICD-10-CM

## 2015-02-04 MED ORDER — METHADONE HCL 10 MG PO TABS: 20.0000 mg | ORAL_TABLET | Freq: Two times a day (BID) | ORAL | Status: DC

## 2015-02-04 MED ORDER — GABAPENTIN 300 MG PO CAPS: 600.0000 mg | ORAL_CAPSULE | Freq: Three times a day (TID) | ORAL | Status: DC

## 2015-02-04 NOTE — Telephone Encounter (Signed)
Patient was in the office today and stated that the Botox, which helped him immensely, is wearing off because his pain is increased.  The cramping in the arm is getting worse again.  He is scheduled to see you on August 26 and he was wondering if we can do another Botox injection at that time. Please advise.

## 2015-02-04 NOTE — Progress Notes (Signed)
Subjective:    Patient ID: Billy Kelley, male    DOB: 1976/05/21, 39 y.o.   MRN: [id number]  HPI: Mr. Bonham Zingale is a 39 year old male who returns for follow up for chronic pain and medication refill. He says his pain is located in his right arm and lower back.  Also complaining of constipation will give Movantik samples he verbalizes understanding. He used enemas, stool softners and linzess no relief noted. He rates his pain 5. His current exercise regime is walking and performing stretching exercises.   Pain Inventory Average Pain 5 Pain Right Now 5 My pain is intermittent and stabbing  In the last 24 hours, has pain interfered with the following? General activity 5 Relation with others 5 Enjoyment of life 5 What TIME of day is your pain at its worst? morning Sleep (in general) Fair  Pain is worse with: inactivity and some activites Pain improves with: medication, injections and BOTOX Relief from Meds: 5  Mobility walk without assistance how many minutes can you walk? 30 ability to climb steps?  yes do you drive?  yes  Function disabled: date disabled .  Neuro/Psych bowel control problems numbness spasms  Prior Studies Any changes since last visit?  no  Physicians involved in your care Any changes since last visit?  no   Family History  Problem Relation Age of Onset  . Hypertension Mother   . Hyperlipidemia Mother   . Cancer Mother 47    endometrial   . Heart disease Father     CHF   . Diabetes Neg Hx    History   Social History  . Marital Status: Single    Spouse Name: N/A  . Number of Children: 1   . Years of Education: N/A   Occupational History  . Disability      [employer]    Social History Main Topics  . Smoking status: Current Every Day Smoker -- 0.50 packs/day for 20 years    Types: Cigarettes  . Smokeless tobacco: Never Used  . Alcohol Use: No  . Drug Use: Yes    Special: Marijuana     Comment: 2-3 times week   . Sexual  Activity: Yes    Birth Control/ Protection: Condom   Other Topics Concern  . None   Social History Narrative   Lives with mom.   Moved from [place] most recently. Also live in [place] in [place].          Past Surgical History  Procedure Laterality Date  . Spinal cord fusion  2005     C4-C6   . Hernia repair Bilateral 1976  . Brain surgery     Past Medical History  Diagnosis Date  . 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 Kidney stone 2012    passed with fluids    BP 114/78 mmHg  Pulse 88  Resp 14  SpO2 96%  Opioid Risk Score:   Fall Risk Score:  `1  Depression screen PHQ 2/9  Depression screen [facility] 2/9 02/04/2015 10/01/2014 04/12/2014 04/12/2014 03/22/2014  Decreased Interest - 2  Down, Depressed, Hopeless 0 2  PHQ - 2 Score 0 4  Altered sleeping - 1 2 - 1  Tired, decreased energy - 2 3 - 2  Change in appetite - 2 2 - 3  Feeling bad or failure about yourself  - 2 2 - 1  Trouble concentrating - 1 2 - 2  Moving slowly or fidgety/restless - 0 1 - 0  Suicidal thoughts - 1 2 - 2  PHQ-9 Score - 11 19 - 15     Review of Systems  Constitutional: Negative.   HENT: Negative.   Eyes: Negative.   Respiratory: Negative.   Cardiovascular: Negative.   Gastrointestinal: Positive for constipation.       Bowel control problems  Endocrine: Negative.   Genitourinary: Negative.   Musculoskeletal: Positive for myalgias and arthralgias.       Right arm pain  Skin: Negative.   Allergic/Immunologic: Negative.   Neurological: Positive for numbness.       Spasms  Hematological: Negative.   Psychiatric/Behavioral: The patient is nervous/anxious.        Objective:   Physical Exam  Constitutional: He is oriented to person, place, and time. He appears well-developed and well-nourished.  HENT:  Head: Normocephalic and atraumatic.  Neck: Normal range of motion. Neck supple.    Cardiovascular: Normal rate and regular rhythm.   Pulmonary/Chest: Effort normal and breath sounds normal.  Musculoskeletal:  Normal Muscle Bulk and Muscle Testing Reveals: Upper Extremities: Right: Decreased ROM 90 Degrees and Muscle Strength 1/5. Unable to Extend or Release Grip Left: Full ROM and Muscle Strength 5/5 Back without spinal or paraspinal tenderness Lower Extremities: Right Decreased ROM and Muscle Strength 4/5. Right AFO Intact Left: Full ROM and Muscle Strength 5/5 Arises from chair slowly Wide Based Gait   Neurological: He is alert and oriented to person, place, and time.  Skin: Skin is warm and dry.  Psychiatric: He has a normal mood and affect.  Nursing note and vitals reviewed.         Assessment & Plan:  1. C5 fx/SCI with [name] Presentation: Refilled: Methadone 10 mg two tablets every 12 hours #120.  Continue Gabapentin for nerve Pain 2. Spastic right hemiparesis: Continue Baclofen 3. Neurogenic bowel: Continue with Bowel Regimen 4. Reactive depression with anxiety, ?PTSD. Continue Cymbalta and Klonopin: Psychiatry Following 5. Insomnia: Continue Elavil  20 minutes of face to face patient care time was spent during this visit. All questions were encouraged and answered.  F/U in 1 month

## 2015-02-05 ENCOUNTER — Telehealth: Payer: Self-pay | Admitting: *Deleted

## 2015-02-05 ENCOUNTER — Other Ambulatory Visit: Payer: Self-pay | Admitting: Registered Nurse

## 2015-02-05 NOTE — Addendum Note (Signed)
Addended by: Orson Slick on: 02/05/2015 11:19 AM   Modules accepted: Orders

## 2015-02-05 NOTE — Addendum Note (Signed)
Addended by: Orson Slick on: 02/05/2015 11:28 AM   Modules accepted: Orders

## 2015-02-05 NOTE — Addendum Note (Signed)
Addended by: Orson Slick on: 02/05/2015 11:22 AM   Modules accepted: Orders

## 2015-02-05 NOTE — Telephone Encounter (Signed)
Called patient and left message stating that he will have the Botox Injection at his next office visit - 03/08/15.  This is a workers comp case... Will forward to [place] to get authorization from the case manager

## 2015-02-05 NOTE — Telephone Encounter (Signed)
Please arrange for repeat botox, 300 units.  thanks

## 2015-02-06 LAB — PMP ALCOHOL METABOLITE (ETG): ETGU: NEGATIVE ng/mL

## 2015-02-09 LAB — METHADONE (GC/LC/MS), URINE
EDDP (GC/LC/MS), ur confirm: 5186 ng/mL (ref <100)
Methadone (GC/LC/MS), ur confirm: 3048 ng/mL (ref <100)

## 2015-02-11 LAB — PRESCRIPTION MONITORING PROFILE (SOLSTAS)
Amphetamine/Meth: NEGATIVE ng/mL
BARBITURATE SCREEN, URINE: NEGATIVE ng/mL
BENZODIAZEPINE SCREEN, URINE: NEGATIVE ng/mL
Buprenorphine, Urine: NEGATIVE ng/mL
CANNABINOID SCRN UR: NEGATIVE ng/mL
CARISOPRODOL, URINE: NEGATIVE ng/mL
CREATININE, URINE: 35.86 mg/dL (ref >20.0)
Cocaine Metabolites: NEGATIVE ng/mL
FENTANYL URINE: NEGATIVE ng/mL
MDMA URINE: NEGATIVE ng/mL
Meperidine, Ur: NEGATIVE ng/mL
NITRITES URINE, INITIAL: NEGATIVE ug/mL
OXYCODONE SCRN UR: NEGATIVE ng/mL
Opiate Screen, Urine: NEGATIVE ng/mL
PROPOXYPHENE: NEGATIVE ng/mL
TAPENTADOLUR: NEGATIVE ng/mL
Tramadol Scrn, Ur: NEGATIVE ng/mL
ZOLPIDEM, URINE: NEGATIVE ng/mL
pH, Initial: 5.4 pH (ref 4.5–8.9)

## 2015-02-18 ENCOUNTER — Telehealth: Payer: Self-pay | Admitting: *Deleted

## 2015-02-18 NOTE — Telephone Encounter (Signed)
Billy Kelley gave him samples for Movantik and it worked really well.  He would like an Rx.

## 2015-02-19 MED ORDER — NALOXEGOL OXALATE 25 MG PO TABS: 25.0000 mg | ORAL_TABLET | Freq: Every day | ORAL | Status: DC

## 2015-02-19 NOTE — Telephone Encounter (Signed)
Notified [facility] that Movantik was ordered.

## 2015-02-19 NOTE — Progress Notes (Signed)
Urine drug screen for this encounter is consistent for prescribed methadone.  He reported taking his klonopin the same day as the test but there is no evidence of klonopin or its metabolites in the urine.

## 2015-03-08 ENCOUNTER — Encounter: Payer: Worker's Compensation | Attending: Registered Nurse | Admitting: Physical Medicine & Rehabilitation

## 2015-03-08 ENCOUNTER — Encounter: Payer: Self-pay | Admitting: Physical Medicine & Rehabilitation

## 2015-03-08 VITALS — BP 104/61 | HR 94 | Resp 14

## 2015-03-08 DIAGNOSIS — F419 Anxiety disorder, unspecified: Secondary | ICD-10-CM | POA: Insufficient documentation

## 2015-03-08 DIAGNOSIS — S14155S Other incomplete lesion at C5 level of cervical spinal cord, sequela: Secondary | ICD-10-CM

## 2015-03-08 DIAGNOSIS — F1290 Cannabis use, unspecified, uncomplicated: Secondary | ICD-10-CM | POA: Insufficient documentation

## 2015-03-08 DIAGNOSIS — M545 Low back pain, unspecified: Secondary | ICD-10-CM | POA: Insufficient documentation

## 2015-03-08 DIAGNOSIS — F329 Major depressive disorder, single episode, unspecified: Secondary | ICD-10-CM | POA: Diagnosis not present

## 2015-03-08 DIAGNOSIS — G8111 Spastic hemiplegia affecting right dominant side: Secondary | ICD-10-CM | POA: Diagnosis not present

## 2015-03-08 DIAGNOSIS — G8110 Spastic hemiplegia affecting unspecified side: Secondary | ICD-10-CM | POA: Diagnosis not present

## 2015-03-08 DIAGNOSIS — S14145S Brown-Sequard syndrome at C5 level of cervical spinal cord, sequela: Secondary | ICD-10-CM | POA: Diagnosis not present

## 2015-03-08 DIAGNOSIS — N319 Neuromuscular dysfunction of bladder, unspecified: Secondary | ICD-10-CM

## 2015-03-08 DIAGNOSIS — S12400S Unspecified displaced fracture of fifth cervical vertebra, sequela: Secondary | ICD-10-CM | POA: Diagnosis not present

## 2015-03-08 MED ORDER — METHADONE HCL 10 MG PO TABS: 10.0000 mg | ORAL_TABLET | Freq: Two times a day (BID) | ORAL | Status: DC

## 2015-03-08 MED ORDER — OXYCODONE-ACETAMINOPHEN 5-325 MG PO TABS: 1.0000 | ORAL_TABLET | Freq: Three times a day (TID) | ORAL | Status: DC | PRN

## 2015-03-08 NOTE — Patient Instructions (Signed)
METHADONE:  AM  AND  PM FOR 2 WEEKS, THEN     TWICE DAILY UNTIL FOLLOW UP  PERCOCET: 5/325, ONE PILL EVERY 8 HOURS AS NEEDED.

## 2015-03-08 NOTE — Progress Notes (Signed)
Subjective:    Patient ID: Billy Kelley, male    DOB: Nov 13, 1975, 39 y.o.   MRN: [id number]  HPI   Billy Kelley is here in follow up of his spinal cord injury and associate functional and physical deficits. He was supposed to come back for botox injections today but they haven't been approved by worker's comp. He had magnificent result with the last round of botox injections we performed in April. The botox injections have worn off and he's had increased pain and cramping as well as decreased functional use of the hand.   He has experienced increased right low back pain. It seems to be gradually increasing with his increased mobility. He doesn't feel the methadone is working as it once did.   We provided samples of movantik at last visit and he's had great results with his bowel movements. He is regular with the movantik and doesn't need a suppository, softener, or laxative. He has minimal cramping.   Pain Inventory Average Pain 7 Pain Right Now 7 My pain is constant, sharp, dull, stabbing and aching  In the last 24 hours, has pain interfered with the following? General activity 5 Relation with others 5 Enjoyment of life 5 What TIME of day is your pain at its worst? morning Sleep (in general) Fair  Pain is worse with: walking and bending Pain improves with: medication and injections Relief from Meds: 6  Mobility walk with assistance use a cane how many minutes can you walk? 15 ability to climb steps?  yes do you drive?  yes Do you have any goals in this area?  yes  Function disabled: date disabled . I need assistance with the following:  toileting and meal prep Do you have any goals in this area?  yes  Neuro/Psych bowel control problems spasms depression  Prior Studies Any changes since last visit?  no  Physicians involved in your care Any changes since last visit?  no   Family History  Problem Relation Age of Onset  . Hypertension Mother   . Hyperlipidemia  Mother   . Cancer Mother 69    endometrial   . Heart disease Father     CHF   . Diabetes Neg Hx    Social History   Social History  . Marital Status: Single    Spouse Name: N/A  . Number of Children: 1   . Years of Education: N/A   Occupational History  . Disability      [employer]    Social History Main Topics  . Smoking status: Current Every Day Smoker -- 0.50 packs/day for 20 years    Types: Cigarettes  . Smokeless tobacco: Never Used  . Alcohol Use: No  . Drug Use: Yes    Special: Marijuana     Comment: 2-3 times week   . Sexual Activity: Yes    Birth Control/ Protection: Condom   Other Topics Concern  . None   Social History Narrative   Lives with mom.   Moved from [place] most recently. Also live in [place] in [place].          Past Surgical History  Procedure Laterality Date  . Spinal cord fusion  2005     C4-C6   . Hernia repair Bilateral 1976  . Brain surgery     Past Medical History  Diagnosis Date  . 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 Kidney stone 2012    passed with fluids    BP 104/61 mmHg  Pulse 94  Resp 14  SpO2 95%  Opioid Risk Score:   Fall Risk Score:  `1  Depression screen PHQ 2/9  Depression screen [facility] 2/9 02/04/2015 10/01/2014 04/12/2014 04/12/2014 03/22/2014  Decreased Interest 1 1 3  - 2  Down, Depressed, Hopeless 1 1 2  0 2  PHQ - 2 Score 2 2 5  0 4  Altered sleeping - 1 2 - 1  Tired, decreased energy - 2 3 - 2  Change in appetite - 2 2 - 3  Feeling bad or failure about yourself  - 2 2 - 1  Trouble concentrating - 1 2 - 2  Moving slowly or fidgety/restless - 0 1 - 0  Suicidal thoughts - 1 2 - 2  PHQ-9 Score - 11 19 - 15     Review of Systems  Gastrointestinal: Positive for constipation.  Neurological:       Spasms  Psychiatric/Behavioral: Positive for dysphoric mood.       Objective:   Physical Exam  General: Alert and oriented x 3, No  apparent distress  HEENT: Head is normocephalic, atraumatic, PERRLA, EOMI, sclera anicteric, oral mucosa pink and moist, dentition intact, ext ear canals clear,  Neck: Supple without JVD or lymphadenopathy  Heart: Reg rate and rhythm. No murmurs rubs or gallops  Chest: CTA bilaterally without wheezes, rales, or rhonchi; no distress  Abdomen: Soft, non-tender, non-distended, bowel sounds positive.  Extremities: No clubbing, cyanosis, or edema. Pulses are 2+  Skin: Clean and intact without signs of breakdown  Neuro: Alert and oriented x3. CN exam intact. 3+ to 4/5 deltoid, bicep. Trace wrist and elbow extension, HI, 1/5 grip. Flexor contractures of the right fingers and wrists are still noted. Underlying tone of 2-3/4--I can forcefully range fingers into full extension. Decreased LT,PP 1+/2 RUE. LUE nearly 5/5 prox to distal. Normal sensory LUE. RLE: 2 HF, HE, 2- KE and KF, trace ADF/eversion, absent PF. Tone at ankle is 1-2/4. Fluctuating tone knee at 2-3/4. Pt's LLE: trace to 0/2 sensation. Strength grossly 4 to 4+/5. Pt ambulates using his straight cane and employs a steppage gait pattern to help clear the right foot---improved. Truncal sensation 1/2 left side. Right trunk sensation nearly normal. May have had some muscle spasm in the abdominal musculature.  Musculoskeletal: l  Fairly good pelvic symmetry while standing but uses substitution patterns and engages right pevlis and trunk to assist in swinging right leg. Decreased right knee bend during gait. When he focuses and slows down he can walk with better balance and symmetry.  Right lumbar paraspinals with spasms and tenderness with palpation. Psych: Pt's affect was generally appropriate. A little anxious but improved.  Assessment & Plan:   1. C5 fx/SCI with [name] Presentation  2. Spastic right hemiparesis  3. Neurogenic bowel  4. Reactive depression with anxiety, ?PTSD    Plan:  1. Movantik 25mg  daily has worked  EXTREMELY well---better than suppositories softeners,laxatives or any other meds he's used before.  He has minimal cramping with these also 2. Will introduce percocet 5/325 for breakthrough pain. Titrate as needed 3. Maintain elavil to 50mg  qhs for sleep and nerve pain  4. Methadone: will wean this down to 10mg  bid with the introduction of the percocet above. Goal is wean off this entirely. 5. Continue with neuro-rehab to work on ROM, spasticity, mobility, orthotic evaluation.  -AFO per [facility]  -awaiting  Botox to right wrist and finger/thumb flexors---300units -probably would benefit from a resting WHO at night once we do botox injections---fingers are too tight to utilize now---will try tennis ball or towel in the meantime.  -discussed the importance of working on posture and technique with gait..   6. Continue baclofen for now. klonopin to 0.5mg  q8.  7.30 minutes of face to face patient care time were spent during this visit. All questions were encouraged and answered.

## 2015-03-15 ENCOUNTER — Ambulatory Visit: Payer: Self-pay | Admitting: Physical Medicine & Rehabilitation

## 2015-03-19 ENCOUNTER — Encounter: Payer: Worker's Compensation | Attending: Registered Nurse | Admitting: Physical Medicine & Rehabilitation

## 2015-03-19 ENCOUNTER — Encounter: Payer: Self-pay | Admitting: Physical Medicine & Rehabilitation

## 2015-03-19 VITALS — BP 103/67 | HR 78

## 2015-03-19 DIAGNOSIS — G8110 Spastic hemiplegia affecting unspecified side: Secondary | ICD-10-CM | POA: Diagnosis not present

## 2015-03-19 DIAGNOSIS — F1290 Cannabis use, unspecified, uncomplicated: Secondary | ICD-10-CM | POA: Insufficient documentation

## 2015-03-19 DIAGNOSIS — F329 Major depressive disorder, single episode, unspecified: Secondary | ICD-10-CM | POA: Insufficient documentation

## 2015-03-19 DIAGNOSIS — F419 Anxiety disorder, unspecified: Secondary | ICD-10-CM | POA: Insufficient documentation

## 2015-03-19 DIAGNOSIS — S14145S Brown-Sequard syndrome at C5 level of cervical spinal cord, sequela: Secondary | ICD-10-CM | POA: Insufficient documentation

## 2015-03-19 DIAGNOSIS — G8111 Spastic hemiplegia affecting right dominant side: Secondary | ICD-10-CM | POA: Diagnosis not present

## 2015-03-19 NOTE — Progress Notes (Signed)
Botox Injection for spasticity using needle EMG guidance Indication: spastic tetraplegia RUE. ICD: G81.10  Dilution: 100 Units/ml Total Units Injected: 300 Indication: Severe spasticity which interferes with ADL,mobility and/or hygiene and is unresponsive to medication management and other conservative care Informed consent was obtained after describing risks and benefits of the procedure with the patient. This includes bleeding, bruising, infection, excessive weakness, or medication side effects. A REMS form is on file and signed.  Needle: 50mm injectable monopolar needle electrode  Number of units per muscle Pectoralis Major 0 units Pectoralis Minor 0 units Biceps 0 units Brachioradialis 0 units FCR 0 units FCU 0 units FDS 150 units 4 access points FDP 150 units 4 access points FPL 0 units Pronator Teres 0 units Pronator Quadratus 0 units All injections were done after obtaining appropriate EMG activity and after negative drawback for blood. The patient tolerated the procedure well. Post procedure instructions were given. A followup appointment was made for 7 weeks with me. He's sees Jacalyn Lefevre back on 9/26

## 2015-03-19 NOTE — Patient Instructions (Signed)
PLEASE CALL ME WITH ANY PROBLEMS OR QUESTIONS (#[phone]).  HAVE A GOOD DAY!    

## 2015-04-01 ENCOUNTER — Other Ambulatory Visit: Payer: Self-pay | Admitting: Physical Medicine & Rehabilitation

## 2015-04-08 ENCOUNTER — Encounter (HOSPITAL_BASED_OUTPATIENT_CLINIC_OR_DEPARTMENT_OTHER): Payer: Worker's Compensation | Admitting: Registered Nurse

## 2015-04-08 ENCOUNTER — Encounter: Payer: Self-pay | Admitting: Registered Nurse

## 2015-04-08 VITALS — BP 115/74 | HR 86

## 2015-04-08 DIAGNOSIS — S14145S Brown-Sequard syndrome at C5 level of cervical spinal cord, sequela: Secondary | ICD-10-CM

## 2015-04-08 DIAGNOSIS — S12400S Unspecified displaced fracture of fifth cervical vertebra, sequela: Secondary | ICD-10-CM

## 2015-04-08 DIAGNOSIS — M545 Low back pain, unspecified: Secondary | ICD-10-CM

## 2015-04-08 DIAGNOSIS — G8111 Spastic hemiplegia affecting right dominant side: Secondary | ICD-10-CM

## 2015-04-08 DIAGNOSIS — N319 Neuromuscular dysfunction of bladder, unspecified: Secondary | ICD-10-CM

## 2015-04-08 DIAGNOSIS — Z79899 Other long term (current) drug therapy: Secondary | ICD-10-CM

## 2015-04-08 DIAGNOSIS — S14155S Other incomplete lesion at C5 level of cervical spinal cord, sequela: Secondary | ICD-10-CM

## 2015-04-08 DIAGNOSIS — G8110 Spastic hemiplegia affecting unspecified side: Secondary | ICD-10-CM | POA: Diagnosis not present

## 2015-04-08 DIAGNOSIS — K592 Neurogenic bowel, not elsewhere classified: Secondary | ICD-10-CM

## 2015-04-08 DIAGNOSIS — Z5181 Encounter for therapeutic drug level monitoring: Secondary | ICD-10-CM

## 2015-04-08 MED ORDER — METHADONE HCL 10 MG PO TABS: 10.0000 mg | ORAL_TABLET | Freq: Two times a day (BID) | ORAL | Status: DC

## 2015-04-08 MED ORDER — OXYCODONE-ACETAMINOPHEN 5-325 MG PO TABS: 1.0000 | ORAL_TABLET | Freq: Three times a day (TID) | ORAL | Status: DC | PRN

## 2015-04-08 NOTE — Progress Notes (Signed)
Subjective:    Patient ID: Billy Kelley, male    DOB: 1976-04-15, 39 y.o.   MRN: [id number]  HPI: Mr. Semaje Kinker is a 39 year old male who returns for follow up for chronic pain and medication refill. He says his pain is located in his right arm and lower back. Only able to be relieved of constipation with Movantik samples given. Office will submit a prior authorization to obtain Movantik. he verbalizes understanding. He used enemas, stool softners and linzess no relief noted. He rates his pain 6. His current exercise regime is walking and performing stretching exercises. S/P Botox. He's in the process of being weaned off Methadone tolerating.  Pain Inventory Average Pain 6 Pain Right Now 6 My pain is constant, sharp and stabbing  In the last 24 hours, has pain interfered with the following? General activity 6 Relation with others 6 Enjoyment of life 7 What TIME of day is your pain at its worst? morning Sleep (in general) Fair  Pain is worse with: walking and standing Pain improves with: medication and injections Relief from Meds: 5  Mobility walk with assistance use a cane how many minutes can you walk? 10 ability to climb steps?  yes do you drive?  yes Do you have any goals in this area?  no  Function disabled: date disabled . I need assistance with the following:  toileting and meal prep  Neuro/Psych bowel control problems numbness spasms depression anxiety  Prior Studies Any changes since last visit?  no  Physicians involved in your care Any changes since last visit?  no   Family History  Problem Relation Age of Onset  . Hypertension Mother   . Hyperlipidemia Mother   . Cancer Mother 81    endometrial   . Heart disease Father     CHF   . Diabetes Neg Hx    Social History   Social History  . Marital Status: Single    Spouse Name: N/A  . Number of Children: 1   . Years of Education: N/A   Occupational History  . Disability      [employer]      Social History Main Topics  . Smoking status: Current Every Day Smoker -- 0.50 packs/day for 20 years    Types: Cigarettes  . Smokeless tobacco: Never Used  . Alcohol Use: No  . Drug Use: Yes    Special: Marijuana     Comment: 2-3 times week   . Sexual Activity: Yes    Birth Control/ Protection: Condom   Other Topics Concern  . None   Social History Narrative   Lives with mom.   Moved from [place] most recently. Also live in [place] in [place].          Past Surgical History  Procedure Laterality Date  . Spinal cord fusion  2005     C4-C6   . Hernia repair Bilateral 1976  . Brain surgery     Past Medical History  Diagnosis Date  . 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 Kidney stone 2012    passed with fluids    BP 115/74 mmHg  Pulse 86  SpO2 96%  Opioid Risk Score:   Fall Risk Score:  `1  Depression screen PHQ 2/9  Depression screen [facility] 2/9 03/19/2015 02/04/2015 10/01/2014 04/12/2014 04/12/2014 03/22/2014  Decreased Interest 1 1 1  3 - 2  Down, Depressed, Hopeless 0 2  PHQ - 2 Score 0 4  Altered sleeping - - 1 2 - 1  Tired, decreased energy - - 2 3 - 2  Change in appetite - - 2 2 - 3  Feeling bad or failure about yourself  - - 2 2 - 1  Trouble concentrating - - 1 2 - 2  Moving slowly or fidgety/restless - - 0 1 - 0  Suicidal thoughts - - 1 2 - 2  PHQ-9 Score - - 11 19 - 15      Review of Systems  Gastrointestinal: Positive for abdominal pain and constipation.  Neurological: Positive for numbness.       Spasms  Psychiatric/Behavioral: Positive for dysphoric mood. The patient is nervous/anxious.   All other systems reviewed and are negative.      Objective:   Physical Exam  Constitutional: He is oriented to person, place, and time. He appears well-developed and well-nourished.  HENT:  Head: Normocephalic and atraumatic.  Neck: Normal range of motion. Neck  supple.  Cardiovascular: Normal rate and regular rhythm.   Pulmonary/Chest: Effort normal and breath sounds normal.  Musculoskeletal:  Normal Muscle Bulk and Muscle Testing Reveals: Upper Extremities: Right: Decreased ROM and Muscle Strength 1/5 Left: Full ROM and Muscle Strength 5/5 Lumbar Paraspinal Tenderness: L-4- L-5 Lower Extremities: Right: Decreased ROM and Muscle Strength 4/4. Right AFO Left: Full ROM and Muscle strength 5/5 Arises from chair slowly Wide Based gait   Neurological: He is alert and oriented to person, place, and time.  Skin: Skin is warm and dry.  Psychiatric: He has a normal mood and affect.  Nursing note and vitals reviewed.         Assessment & Plan:  1. C5 fx/SCI with [name] Presentation: RX: Methadone 10 mg  one tablet every 12 hours #60 and Refilled: Oxycodone 5/325 mg one tablet every 8 hours as needed #90. Continue Gabapentin for nerve Pain 2. Spastic right hemiparesis: Continue Baclofen 3. Neurogenic bowel: Continue with Bowel Regimen. Movantik Samples Given. 4. Reactive depression with anxiety, ?PTSD. Continue Cymbalta and Klonopin: Psychiatry Following 5. Insomnia: Continue Elavil  20 minutes of face to face patient care time was spent during this visit. All questions were encouraged and answered.  F/U in 1 month

## 2015-04-15 ENCOUNTER — Telehealth: Payer: Self-pay | Admitting: Physical Medicine & Rehabilitation

## 2015-04-15 NOTE — Telephone Encounter (Signed)
Billy Kelley wanted patient to call her back about raising his oxycodone/percocet prior to his next visit with her so she could run this by Dr. Riley Kill.

## 2015-04-15 NOTE — Telephone Encounter (Signed)
Spoke to Mr. Umanzor he's tolerating the Methadone wean. He's currently on Methadone 15 mg daily for the last four days . Next week he will decrease his dose to Methadone 10 mg daily. Dr. Riley Kill was updated.  We will increase his Percocet to 7.5 mg TID.

## 2015-04-19 ENCOUNTER — Telehealth: Payer: Self-pay | Admitting: Physical Medicine & Rehabilitation

## 2015-04-19 NOTE — Telephone Encounter (Signed)
Prescription for Movantik and letter for auth needs to be sent to his case manager Diane at [phone]

## 2015-04-19 NOTE — Telephone Encounter (Signed)
Called pt back to get an update on his case worker and fax contact information. Request for Movantix and supporting clinic notes sent to Diane for review

## 2015-05-04 ENCOUNTER — Other Ambulatory Visit: Payer: Self-pay | Admitting: Physical Medicine & Rehabilitation

## 2015-05-06 ENCOUNTER — Other Ambulatory Visit: Payer: Self-pay | Admitting: Physical Medicine & Rehabilitation

## 2015-05-07 ENCOUNTER — Encounter: Payer: Worker's Compensation | Attending: Registered Nurse | Admitting: Registered Nurse

## 2015-05-07 ENCOUNTER — Encounter: Payer: Self-pay | Admitting: Registered Nurse

## 2015-05-07 VITALS — BP 109/77 | HR 87

## 2015-05-07 DIAGNOSIS — S14155S Other incomplete lesion at C5 level of cervical spinal cord, sequela: Secondary | ICD-10-CM

## 2015-05-07 DIAGNOSIS — F329 Major depressive disorder, single episode, unspecified: Secondary | ICD-10-CM | POA: Diagnosis not present

## 2015-05-07 DIAGNOSIS — M545 Low back pain, unspecified: Secondary | ICD-10-CM

## 2015-05-07 DIAGNOSIS — N319 Neuromuscular dysfunction of bladder, unspecified: Secondary | ICD-10-CM

## 2015-05-07 DIAGNOSIS — F419 Anxiety disorder, unspecified: Secondary | ICD-10-CM | POA: Diagnosis not present

## 2015-05-07 DIAGNOSIS — S14145S Brown-Sequard syndrome at C5 level of cervical spinal cord, sequela: Secondary | ICD-10-CM | POA: Insufficient documentation

## 2015-05-07 DIAGNOSIS — F1290 Cannabis use, unspecified, uncomplicated: Secondary | ICD-10-CM | POA: Insufficient documentation

## 2015-05-07 DIAGNOSIS — G8111 Spastic hemiplegia affecting right dominant side: Secondary | ICD-10-CM | POA: Diagnosis not present

## 2015-05-07 DIAGNOSIS — S12400S Unspecified displaced fracture of fifth cervical vertebra, sequela: Secondary | ICD-10-CM

## 2015-05-07 MED ORDER — DULOXETINE HCL 60 MG PO CPEP: 60.0000 mg | ORAL_CAPSULE | Freq: Every day | ORAL | Status: DC

## 2015-05-07 MED ORDER — OXYCODONE-ACETAMINOPHEN 7.5-325 MG PO TABS: 1.0000 | ORAL_TABLET | Freq: Three times a day (TID) | ORAL | Status: DC | PRN

## 2015-05-07 NOTE — Progress Notes (Signed)
Subjective:    Patient ID: Billy Kelley, male    DOB: 04-04-1976, 39 y.o.   MRN: [id number]  HPI: Billy Kelley is a 39 year old male who returns for follow up for chronic pain and medication refill. He says his pain is located in his right arm, lower back, right hip and right lower extremity. He rates his pain 9.His current exercise regime is walking, performing stretching exercises and fishing. Also states has been experiencing increase muscle spasm as he's weaning the methadoneDuring the assessment of right upper extremity facial grimacing and spasm noted. Billy Kelley began to cry emotional support given. He's currently taking Methadone 10 mg daily and has increase intensity of pain on his whole right side upper and lower extremity especially at night. He was instructed to resume Methadone 15 mg daily until Friday 05/10/2015. Percocet increase to 7.5/325 to TID.He verbalizes understanding. He has returned the  04/08/15 script of Methadone 10 mg every 12 hrs #60, script discarded. Also taking Baclofen 20 mg two tablets TID, script written for one tablet four times a day he may be experiencing Baclofen overdose instructed to wean baclofen slowly to written prescription as  ordered he verbalizes understanding.  I will be sending a message to Dr. Riley KillSwartz regarding the above for further input and give him a call he verbalizes understanding. Mother in room all question answered.  Pain Inventory Average Pain 9 Pain Right Now 9 My pain is constant, sharp, stabbing and aching  In the last 24 hours, has pain interfered with the following? General activity 2 Relation with others 1 Enjoyment of life 1 What TIME of day is your pain at its worst? morning Sleep (in general) Poor  Pain is worse with: walking, standing and some activites Pain improves with: medication Relief from Meds: 7  Mobility use a cane how many minutes can you walk? 10 ability to climb steps?  yes do you drive?  yes Do  you have any goals in this area?  yes  Function disabled: date disabled 12/25/2003 I need assistance with the following:  feeding Do you have any goals in this area?  yes  Neuro/Psych bowel control problems numbness trouble walking spasms depression anxiety  Prior Studies Any changes since last visit?  no  Physicians involved in your care Any changes since last visit?  no   Family History  Problem Relation Age of Onset  . Hypertension Mother   . Hyperlipidemia Mother   . Cancer Mother [id number]    endometrial   . Heart disease Father     CHF   . Diabetes Neg Hx    Social History   Social History  . Marital Status: Single    Spouse Name: N/A  . Number of Children: 1   . Years of Education: N/A   Occupational History  . Disability      [employer]    Social History Main Topics  . Smoking status: Current Every Day Smoker -- 0.50 packs/day for 20 years    Types: Cigarettes  . Smokeless tobacco: Never Used  . Alcohol Use: No  . Drug Use: Yes    Special: Marijuana     Comment: 2-3 times week   . Sexual Activity: Yes    Birth Control/ Protection: Condom   Other Topics Concern  . None   Social History Narrative   Lives with mom.   [place] from [place]   Son in [place].  Past Surgical History  Procedure Laterality Date  . Spinal cord fusion  2005     C4-C6   . Hernia repair Bilateral 1976  . Brain surgery     Past Medical History  Diagnosis Date  . 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 Kidney stone 2012    passed with fluids    BP 109/77 mmHg  Pulse 87  Opioid Risk Score:   Fall Risk Score:  `1  Depression screen PHQ 2/9  Depression screen [facility] 2/9 03/19/2015 02/04/2015 10/01/2014 04/12/2014 04/12/2014 03/22/2014  Decreased Interest - 2  Down, Depressed, Hopeless 0 2  PHQ - 2 Score 0 4  Altered sleeping - - 1 2 -  1  Tired, decreased energy - - 2 3 - 2  Change in appetite - - 2 2 - 3  Feeling bad or failure about yourself  - - 2 2 - 1  Trouble concentrating - - 1 2 - 2  Moving slowly or fidgety/restless - - 0 1 - 0  Suicidal thoughts - - 1 2 - 2  PHQ-9 Score - - 11 19 - 15     Review of Systems  Gastrointestinal:       Bowel control problems  Musculoskeletal:       Spasms  Neurological: Positive for numbness.       Trouble walking  Psychiatric/Behavioral: The patient is nervous/anxious.        Depression  All other systems reviewed and are negative.      Objective:   Physical Exam  Constitutional: He is oriented to person, place, and time. He appears well-developed and well-nourished.  HENT:  Head: Normocephalic and atraumatic.  Neck: Normal range of motion. Neck supple.  Cardiovascular: Normal rate and regular rhythm.   Pulmonary/Chest: Effort normal and breath sounds normal.  Musculoskeletal:  Normal Muscle Bulk and Muscle Testing Reveals: Upper Extremity: Right: Decreased ROM 90 Degrees and Muscle Spasm noted with extension. Left: Full ROM and Muscle Strength 5/5 Lower Extremity:Left: Full ROM an Muscle Strength 5/5 Right: Decreased ROM and Muscle Strength 4/5 AFO Antalgic gait  Neurological: He is alert and oriented to person, place, and time.  Skin: Skin is warm and dry.  Psychiatric: He has a normal mood and affect.  Nursing note and vitals reviewed.         Assessment & Plan:  1. C5 fx/SCI with [name] Presentation: Continue Methadone 15 mgdaily unti 05/09/2014. Resume weaning Methadone  daily  and RX: Increased: Oxycodone 7.5/325 mg one tablet every 8 hours as needed #90. Continue Gabapentin for nerve Pain 2. Spastic right hemiparesis: Continue Baclofen as prescribed 3. Neurogenic bowel: Continue with Bowel Regimen. Continue Movantik. 4. Reactive depression with anxiety, ?PTSD. Continue Cymbalta and Klonopin: Psychiatry Following 5. Insomnia: Continue  Elavil  30 minutes of face to face patient care time was speHoliday representative this visit. All questions were encouraged and answered.  F/U in 1 month

## 2015-05-09 ENCOUNTER — Telehealth: Payer: Self-pay | Admitting: Registered Nurse

## 2015-05-09 NOTE — Telephone Encounter (Signed)
Doctor Riley KillSwartz can you read my note on [name] let me know if you have any suggestions.

## 2015-05-10 MED ORDER — METHADONE HCL 10 MG PO TABS: ORAL_TABLET | ORAL | Status: DC

## 2015-05-10 NOTE — Telephone Encounter (Signed)
Spoke with [name],  Dr. Riley KillSwartz responded to questions we will continue Methadone 15 mg daily, he verbalizes understanding. He will call office in two weeks and we will discuss. Methadone wean of 10mg . He verbalizes understanding.

## 2015-05-10 NOTE — Telephone Encounter (Signed)
We can continue the methadone 15mg  daily with the adjusted percocet dose as we slowly transition/wean.  thanks

## 2015-05-10 NOTE — Telephone Encounter (Signed)
Billy Kelley. I am forwarding to you.

## 2015-05-31 ENCOUNTER — Telehealth: Payer: Self-pay | Admitting: Physical Medicine & Rehabilitation

## 2015-05-31 NOTE — Telephone Encounter (Signed)
Morrie SheldonAshley with [facility] [employer] sent over a request for a letter of medical necessity to be sent over.  Please call her at [phone] ext. [phone].

## 2015-06-04 NOTE — Telephone Encounter (Signed)
Form completed and on Swartz's desk

## 2015-06-10 ENCOUNTER — Other Ambulatory Visit: Payer: Self-pay | Admitting: Physical Medicine & Rehabilitation

## 2015-06-11 ENCOUNTER — Encounter: Payer: Worker's Compensation | Attending: Registered Nurse | Admitting: Physical Medicine & Rehabilitation

## 2015-06-11 ENCOUNTER — Encounter: Payer: Self-pay | Admitting: Physical Medicine & Rehabilitation

## 2015-06-11 ENCOUNTER — Other Ambulatory Visit: Payer: Self-pay | Admitting: Physical Medicine & Rehabilitation

## 2015-06-11 VITALS — BP 109/56 | HR 80 | Resp 14

## 2015-06-11 DIAGNOSIS — S14145S Brown-Sequard syndrome at C5 level of cervical spinal cord, sequela: Secondary | ICD-10-CM | POA: Diagnosis not present

## 2015-06-11 DIAGNOSIS — F1290 Cannabis use, unspecified, uncomplicated: Secondary | ICD-10-CM | POA: Diagnosis not present

## 2015-06-11 DIAGNOSIS — G894 Chronic pain syndrome: Secondary | ICD-10-CM

## 2015-06-11 DIAGNOSIS — Z5181 Encounter for therapeutic drug level monitoring: Secondary | ICD-10-CM | POA: Diagnosis not present

## 2015-06-11 DIAGNOSIS — F329 Major depressive disorder, single episode, unspecified: Secondary | ICD-10-CM | POA: Diagnosis not present

## 2015-06-11 DIAGNOSIS — F419 Anxiety disorder, unspecified: Secondary | ICD-10-CM | POA: Insufficient documentation

## 2015-06-11 DIAGNOSIS — Z79899 Other long term (current) drug therapy: Secondary | ICD-10-CM | POA: Diagnosis not present

## 2015-06-11 DIAGNOSIS — G8111 Spastic hemiplegia affecting right dominant side: Secondary | ICD-10-CM | POA: Diagnosis not present

## 2015-06-11 MED ORDER — METHADONE HCL 10 MG PO TABS: ORAL_TABLET | ORAL | Status: DC

## 2015-06-11 MED ORDER — OXYCODONE-ACETAMINOPHEN 10-325 MG PO TABS: 1.0000 | ORAL_TABLET | Freq: Three times a day (TID) | ORAL | Status: DC | PRN

## 2015-06-11 NOTE — Patient Instructions (Signed)
PLEASE CALL ME WITH ANY PROBLEMS OR QUESTIONS (#[phone]). HAVE A HAPPY HOLIDAY SEASON!!!    

## 2015-06-11 NOTE — Progress Notes (Signed)
Subjective:    Patient ID: Billy Kelley, male    DOB: September 23, 1975, 39 y.o.   MRN: [id number]  HPI   Edie is here in follow up of his right spastic hemiparesis and chronic pain. He had good results with the prior botox injections but not to the extent he did with the first set. He is doing stretching at home and uses towels to preserve his hand rom. Still his fingers will curl and  Pinch into his skin when he has a spastic flare.   His bowels are more regular with the movantik, but he still with have an occasional radiation back pain when he is about to have a bm.   His AFO is working well. His gait has improved. There are no pressure points with the brace .  We tried to wean his methadone down to  but he could not tolerate. He is down to  daily at present. We initiated percocet which seems to have helped. He is currently at the 7.5/325mg  dose q8 prn.     Pain Inventory Average Pain 5 Pain Right Now 5 My pain is constant, sharp and stabbing  In the last 24 hours, has pain interfered with the following? General activity 4 Relation with others 4 Enjoyment of life 4 What TIME of day is your pain at its worst? morning Sleep (in general) Fair  Pain is worse with: walking and bending Pain improves with: medication and injections Relief from Meds: 6  Mobility walk with assistance use a cane how many minutes can you walk? 10 ability to climb steps?  yes do you drive?  yes  Function disabled: date disabled . I need assistance with the following:  meal prep and household duties  Neuro/Psych bowel control problems numbness spasms anxiety  Prior Studies Any changes since last visit?  no  Physicians involved in your care Any changes since last visit?  no   Family History  Problem Relation Age of Onset  . Hypertension Mother   . Hyperlipidemia Mother   . Cancer Mother 9    endometrial   . Heart disease Father     CHF   . Diabetes Neg Hx    Social  History   Social History  . Marital Status: Single    Spouse Name: N/A  . Number of Children: 1   . Years of Education: N/A   Occupational History  . Disability      [employer]    Social History Main Topics  . Smoking status: Current Every Day Smoker -- 0.50 packs/day for 20 years    Types: Cigarettes  . Smokeless tobacco: Never Used  . Alcohol Use: No  . Drug Use: Yes    Special: Marijuana     Comment: 2-3 times week   . Sexual Activity: Yes    Birth Control/ Protection: Condom   Other Topics Concern  . None   Social History Narrative   Lives with mom.   Moved from [place] most recently. Also live in [place] in [place].          Past Surgical History  Procedure Laterality Date  . Spinal cord fusion  2005     C4-C6   . Hernia repair Bilateral 1976  . Brain surgery     Past Medical History  Diagnosis Date  . 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  Kidney stone 2012    passed with fluids    BP 109/56 mmHg  Pulse 80  Resp 14  SpO2 97%  Opioid Risk Score:   Fall Risk Score:  `1  Depression screen PHQ 2/9  [facility] 2/9 03/19/2015 02/04/2015 10/01/2014 04/12/2014 04/12/2014 03/22/2014  Decreased Interest 1 1 1 3  - 2  Down, Depressed, Hopeless 1 1 1 2  0 2  PHQ - 2 Score 2 2 2 5  0 4  Altered sleeping - - 1 2 - 1  Tired, decreased energy - - 2 3 - 2  Change in appetite - - 2 2 - 3  Feeling bad or failure about yourself  - - 2 2 - 1  Trouble concentrating - - 1 2 - 2  Moving slowly or fidgety/restless - - 0 1 - 0  Suicidal thoughts - - 1 2 - 2  PHQ-9 Score - - 11 19 - 15     Review of Systems  Gastrointestinal: Positive for constipation.  Musculoskeletal: Positive for gait problem.       Spasms   Neurological: Positive for numbness.  Psychiatric/Behavioral: The patient is nervous/anxious.   All other systems reviewed and are negative.      Objective:   Physical  Exam  General: Alert and oriented x 3, No apparent distress  HEENT: Head is normocephalic, atraumatic, PERRLA, EOMI, sclera anicteric, oral mucosa pink and moist, dentition intact, ext ear canals clear,  Neck: Supple without JVD or lymphadenopathy  Heart: Reg rate and rhythm. No murmurs rubs or gallops  Chest: CTA bilaterally without wheezes, rales, or rhonchi; no distress  Abdomen: Soft, non-tender, non-distended, bowel sounds positive.  Extremities: No clubbing, cyanosis, or edema. Pulses are 2+  Skin: Clean and intact without signs of breakdown  Neuro: Alert and oriented x3. CN exam intact. 3+ to 4/5 deltoid, bicep. Trace wrist and elbow extension, HI, 1/5 grip. Finger fexor tone RUE, ?lumbrical involvement-  Decreased LT,PP 1+/2 RUE. LUE nearly 5/5 prox to distal. Normal sensory LUE. RLE: 2 HF, HE, 2- KE and KF, trace ADF/eversion, absent PF. Tone at ankle is 1-2/4. Fluctuating tone knee at 2-3/4. Pt's LLE: trace to 0/2 sensation. Strength grossly 4 to 4+/5. Gait improved Truncal sensation 1/2 left side. Right trunk sensation nearly normal. May have had some muscle spasm in the abdominal musculature.  Musculoskeletal: lmpoved pelvic symmetry while standing but uses substitution patterns and engages right pevlis and trunk to assist in swinging right leg. Still with diminished right knee bend during gait. W  Right lumbar paraspinals with spasms and tenderness with palpation. Psych: Pt's affect was generally appropriate. A little anxious but improved.  Assessment & Plan:   1. C5 fx/SCI with [name] Presentation  2. Spastic right hemiparesis  3. Neurogenic bowel  4. Reactive depression with anxiety, ?PTSD    Plan:  1. Movantik 25mg  daily to continue. He has minimal cramping with these also 2.  Increase to Percocet 10/325 for breakthrough pain, q8 prn #90.  3. Maintain elavil to 50mg  qhs for sleep and nerve pain  4. Methadone: will wean this down to 10mg  daily with  goal of dc'ing to off over the next few months.  5. Continue with neuro-rehab to work on ROM, spasticity, mobility, orthotic evaluation.  -AFO per [facility] has been beneficial - Botox to right wrist and finger/thumb flexors, consider lumbricals---300units -consider resting WHO RUE..   6. Continue baclofen for now. klonopin to 0.5mg  q8.  7.30 minutes of face to  face patient care time were spent during this visit. All questions were encouraged and answered. I'll see him back in about a month for botox.

## 2015-06-12 LAB — PMP ALCOHOL METABOLITE (ETG): Ethyl Glucuronide (EtG): NEGATIVE ng/mL

## 2015-06-15 LAB — METHADONE (GC/LC/MS), URINE
EDDPUC: 366 ng/mL (ref <100)
Methadone (GC/LC/MS), ur confirm: 238 ng/mL (ref <100)

## 2015-06-15 LAB — OXYCODONE, URINE (LC/MS-MS)
Noroxycodone, Ur: 452 ng/mL (ref <50)
OXYCODONE, UR: 59 ng/mL (ref <50)
Oxymorphone: 263 ng/mL (ref <50)

## 2015-06-18 LAB — PRESCRIPTION MONITORING PROFILE (SOLSTAS)
Amphetamine/Meth: NEGATIVE ng/mL
Barbiturate Screen, Urine: NEGATIVE ng/mL
Benzodiazepine Screen, Urine: NEGATIVE ng/mL
Buprenorphine, Urine: NEGATIVE ng/mL
CARISOPRODOL, URINE: NEGATIVE ng/mL
COCAINE METABOLITES: NEGATIVE ng/mL
Cannabinoid Scrn, Ur: NEGATIVE ng/mL
Creatinine, Urine: 15.21 mg/dL — ABNORMAL LOW (ref >20.0)
ECSTASY: NEGATIVE ng/mL
Fentanyl, Ur: NEGATIVE ng/mL
Meperidine, Ur: NEGATIVE ng/mL
NITRITES URINE, INITIAL: NEGATIVE ug/mL
Opiate Screen, Urine: NEGATIVE ng/mL
PROPOXYPHENE: NEGATIVE ng/mL
TRAMADOL UR: NEGATIVE ng/mL
Tapentadol, urine: NEGATIVE ng/mL
Zolpidem, Urine: NEGATIVE ng/mL
pH, Initial: 6.6 pH (ref 4.5–8.9)

## 2015-06-26 NOTE — Progress Notes (Signed)
Urine drug screen for this encounter is consistent for prescribed medication 

## 2015-07-01 ENCOUNTER — Encounter: Payer: Worker's Compensation | Attending: Registered Nurse | Admitting: Physical Medicine & Rehabilitation

## 2015-07-01 ENCOUNTER — Encounter: Payer: Self-pay | Admitting: Physical Medicine & Rehabilitation

## 2015-07-01 VITALS — BP 130/60 | HR 97 | Resp 16

## 2015-07-01 DIAGNOSIS — G894 Chronic pain syndrome: Secondary | ICD-10-CM | POA: Diagnosis not present

## 2015-07-01 DIAGNOSIS — Z5181 Encounter for therapeutic drug level monitoring: Secondary | ICD-10-CM | POA: Diagnosis not present

## 2015-07-01 DIAGNOSIS — Z79899 Other long term (current) drug therapy: Secondary | ICD-10-CM | POA: Diagnosis not present

## 2015-07-01 DIAGNOSIS — S14145S Brown-Sequard syndrome at C5 level of cervical spinal cord, sequela: Secondary | ICD-10-CM | POA: Insufficient documentation

## 2015-07-01 DIAGNOSIS — F329 Major depressive disorder, single episode, unspecified: Secondary | ICD-10-CM | POA: Insufficient documentation

## 2015-07-01 DIAGNOSIS — F419 Anxiety disorder, unspecified: Secondary | ICD-10-CM | POA: Insufficient documentation

## 2015-07-01 DIAGNOSIS — F1290 Cannabis use, unspecified, uncomplicated: Secondary | ICD-10-CM | POA: Diagnosis not present

## 2015-07-01 DIAGNOSIS — G8111 Spastic hemiplegia affecting right dominant side: Secondary | ICD-10-CM | POA: Diagnosis not present

## 2015-07-01 MED ORDER — METHADONE HCL 10 MG PO TABS: ORAL_TABLET | ORAL | Status: DC

## 2015-07-01 MED ORDER — OXYCODONE-ACETAMINOPHEN 10-325 MG PO TABS: 1.0000 | ORAL_TABLET | Freq: Three times a day (TID) | ORAL | Status: DC | PRN

## 2015-07-01 NOTE — Progress Notes (Signed)
Botox Injection for spasticity using needle EMG guidance Indication: g81.11. Spastic right hemiparesis  Dilution: 100 Units/ml        Total Units Injected: 300 Indication: Severe spasticity which interferes with ADL,mobility and/or  hygiene and is unresponsive to medication management and other conservative care Informed consent was obtained after describing risks and benefits of the procedure with the patient. This includes bleeding, bruising, infection, excessive weakness, or medication side effects. A REMS form is on file and signed.  Needle: 50mm injectable monopolar needle electrode  Number of units per muscle Pectoralis Major 0 units Pectoralis Minor 0 units Biceps 0 units Brachioradialis 0 units FCR 25 units FCU 25 units FDS 75 units FDP 75 units FPL 25 units Lumbricals (3)--75 units Pronator Teres 0 units Pronator Quadratus 0 units Quadriceps 0 units Gastroc/soleus 0 units Hamstrings 0 units Tibialis Posterior 0 units EHL 0 units All injections were done after obtaining appropriate EMG activity and after negative drawback for blood. The patient tolerated the procedure well. Post procedure instructions were given. A followup appointment was made.

## 2015-07-01 NOTE — Patient Instructions (Signed)
IF YOU FEEL YOU CAN DECREASE METHADONE TO 5MG  DAILY BEFORE YOU COME BACK TO CLINIC, YOU HAVE MY PERMISSION

## 2015-07-03 ENCOUNTER — Telehealth: Payer: Self-pay | Admitting: *Deleted

## 2015-07-03 NOTE — Telephone Encounter (Signed)
Prior auth for Movantik was approved

## 2015-07-07 ENCOUNTER — Other Ambulatory Visit: Payer: Self-pay | Admitting: Physical Medicine & Rehabilitation

## 2015-07-30 ENCOUNTER — Encounter: Payer: Self-pay | Admitting: Registered Nurse

## 2015-07-30 ENCOUNTER — Encounter: Payer: Worker's Compensation | Attending: Registered Nurse | Admitting: Registered Nurse

## 2015-07-30 VITALS — BP 144/73 | HR 92

## 2015-07-30 DIAGNOSIS — G8111 Spastic hemiplegia affecting right dominant side: Secondary | ICD-10-CM | POA: Insufficient documentation

## 2015-07-30 DIAGNOSIS — F419 Anxiety disorder, unspecified: Secondary | ICD-10-CM | POA: Diagnosis not present

## 2015-07-30 DIAGNOSIS — S14145S Brown-Sequard syndrome at C5 level of cervical spinal cord, sequela: Secondary | ICD-10-CM

## 2015-07-30 DIAGNOSIS — Z79899 Other long term (current) drug therapy: Secondary | ICD-10-CM | POA: Diagnosis not present

## 2015-07-30 DIAGNOSIS — F329 Major depressive disorder, single episode, unspecified: Secondary | ICD-10-CM | POA: Diagnosis not present

## 2015-07-30 DIAGNOSIS — F1290 Cannabis use, unspecified, uncomplicated: Secondary | ICD-10-CM | POA: Insufficient documentation

## 2015-07-30 DIAGNOSIS — Z5181 Encounter for therapeutic drug level monitoring: Secondary | ICD-10-CM | POA: Diagnosis not present

## 2015-07-30 DIAGNOSIS — G894 Chronic pain syndrome: Secondary | ICD-10-CM | POA: Diagnosis not present

## 2015-07-30 MED ORDER — METHADONE HCL 10 MG PO TABS: ORAL_TABLET | ORAL | Status: DC

## 2015-07-30 MED ORDER — OXYCODONE-ACETAMINOPHEN 10-325 MG PO TABS: 1.0000 | ORAL_TABLET | Freq: Three times a day (TID) | ORAL | Status: DC | PRN

## 2015-07-30 NOTE — Progress Notes (Signed)
Subjective:    Patient ID: Billy Kelley, male    DOB: 06-11-1976, 40 y.o.   MRN: [id number]  HPI: Billy Kelley is a 40 year old male who returns for follow up for chronic pain and medication refill. He says his pain is located in his right wrist and right hand. He rates his pain 3.His current exercise regime is walking, performing stretching exercises and fishing. S/P Botox with good relief noted. Billy Kelley brought his pill containers with his oxycodone, he didn't have his bottle. Instructed to bring his bottle next time he verbalizes understanding.   Pain Inventory Average Pain 4 Pain Right Now 3 My pain is intermittent and aching  In the last 24 hours, has pain interfered with the following? General activity 7 Relation with others 7 Enjoyment of life 7 What TIME of day is your pain at its worst? morning Sleep (in general) Fair  Pain is worse with: walking and standing Pain improves with: rest and medication Relief from Meds: 5  Mobility walk without assistance walk with assistance use a cane how many minutes can you walk? 10 ability to climb steps?  yes do you drive?  yes  Function Do you have any goals in this area?  no  Neuro/Psych bowel control problems numbness tremor trouble walking spasms depression anxiety  Prior Studies Any changes since last visit?  no  Physicians involved in your care Any changes since last visit?  no   Family History  Problem Relation Age of Onset  . Hypertension Mother   . Hyperlipidemia Mother   . Cancer Mother 80    endometrial   . Heart disease Father     CHF   . Diabetes Neg Hx    Social History   Social History  . Marital Status: Single    Spouse Name: N/A  . Number of Children: 1   . Years of Education: N/A   Occupational History  . Disability      [employer]    Social History Main Topics  . Smoking status: Current Every Day Smoker -- 0.50 packs/day for 20 years    Types: Cigarettes  . Smokeless  tobacco: Never Used  . Alcohol Use: No  . Drug Use: Yes    Special: Marijuana     Comment: 2-3 times week   . Sexual Activity: Yes    Birth Control/ Protection: Condom   Other Topics Concern  . None   Social History Narrative   Lives with mom.   Moved from [place] most recently. Also live in [place] in [place].          Past Surgical History  Procedure Laterality Date  . Spinal cord fusion  2005     C4-C6   . Hernia repair Bilateral 1976  . Brain surgery     Past Medical History  Diagnosis Date  . 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 Kidney stone 2012    passed with fluids    BP 144/73 mmHg  Pulse 92  SpO2 97%  Opioid Risk Score:   Fall Risk Score:  `1  Depression screen PHQ 2/9  Depression screen [facility] 2/9 03/19/2015 02/04/2015 10/01/2014 04/12/2014 04/12/2014 03/22/2014  Decreased Interest - 2  Down, Depressed, Hopeless 0 2  PHQ - 2 Score 0 4  Altered sleeping - - 1 2 - 1  Tired, decreased energy - - 2 3 - 2  Change in appetite - - 2 2 - 3  Feeling bad or failure about yourself  - - 2 2 - 1  Trouble concentrating - - 1 2 - 2  Moving slowly or fidgety/restless - - 0 1 - 0  Suicidal thoughts - - 1 2 - 2  PHQ-9 Score - - 11 19 - 15     Review of Systems  Gastrointestinal: Positive for abdominal pain and constipation.  All other systems reviewed and are negative.      Objective:   Physical Exam  Constitutional: He is oriented to person, place, and time. He appears well-developed and well-nourished.  Neck: Normal range of motion. Neck supple.  Cardiovascular: Normal rate and regular rhythm.   Pulmonary/Chest: Effort normal and breath sounds normal.  Musculoskeletal:  Normal Muscle Bulk and Muscle Testing Reveals: Upper Extremities: Right: Decreased ROM 90 Degrees and Muscle Strength 1/5, Passive Motion with hand release Left: Full ROM and Muscle  strength 5/5 Lumbar Paraspinal Tenderness: L-3-L-4 Lower Extremities: Right: Decreased ROM and Muscle Strength 4/5 Left: Full ROM and Muscle Strength 5/5 Arises from chair slowly using straight cane for support Antalgic gait  Neurological: He is alert and oriented to person, place, and time.  Skin: Skin is warm and dry.  Psychiatric: He has a normal mood and affect.  Nursing note and vitals reviewed.         Assessment & Plan:  1. C5 fx/SCI with [name] Presentation: Refilled: Methadone 10 mg daily #30 and Oxycodone 7.5/325 mg one tablet every 8 hours as needed #90. Continue Gabapentin for nerve Pain 2. Spastic right hemiparesis: Continue Baclofen and Klonopin as prescribed 3. Neurogenic bowel: Continue with Bowel Regimen. Continue Movantik. 4. Reactive depression with anxiety, ?PTSD. Continue Cymbalta Psychiatry Following 5. Insomnia: Continue Elavil  30 minutes of face to face patient care time was spent during this visit. All questions were encouraged and answered.  F/U in 1 month

## 2015-07-31 ENCOUNTER — Encounter: Payer: Worker's Compensation | Admitting: Registered Nurse

## 2015-08-05 ENCOUNTER — Other Ambulatory Visit: Payer: Self-pay | Admitting: Physical Medicine & Rehabilitation

## 2015-08-30 ENCOUNTER — Encounter: Payer: Worker's Compensation | Admitting: Registered Nurse

## 2015-09-01 ENCOUNTER — Other Ambulatory Visit: Payer: Self-pay | Admitting: Physical Medicine & Rehabilitation

## 2015-09-03 ENCOUNTER — Encounter: Payer: Self-pay | Admitting: Registered Nurse

## 2015-09-03 ENCOUNTER — Encounter: Payer: Worker's Compensation | Attending: Registered Nurse | Admitting: Registered Nurse

## 2015-09-03 ENCOUNTER — Other Ambulatory Visit: Payer: Self-pay | Admitting: Registered Nurse

## 2015-09-03 VITALS — BP 120/69 | HR 90

## 2015-09-03 DIAGNOSIS — G894 Chronic pain syndrome: Secondary | ICD-10-CM | POA: Diagnosis not present

## 2015-09-03 DIAGNOSIS — G8111 Spastic hemiplegia affecting right dominant side: Secondary | ICD-10-CM | POA: Diagnosis not present

## 2015-09-03 DIAGNOSIS — Z79899 Other long term (current) drug therapy: Secondary | ICD-10-CM | POA: Diagnosis not present

## 2015-09-03 DIAGNOSIS — S14145S Brown-Sequard syndrome at C5 level of cervical spinal cord, sequela: Secondary | ICD-10-CM | POA: Diagnosis not present

## 2015-09-03 DIAGNOSIS — Z5181 Encounter for therapeutic drug level monitoring: Secondary | ICD-10-CM

## 2015-09-03 DIAGNOSIS — F419 Anxiety disorder, unspecified: Secondary | ICD-10-CM | POA: Diagnosis not present

## 2015-09-03 DIAGNOSIS — N319 Neuromuscular dysfunction of bladder, unspecified: Secondary | ICD-10-CM

## 2015-09-03 DIAGNOSIS — F329 Major depressive disorder, single episode, unspecified: Secondary | ICD-10-CM | POA: Diagnosis not present

## 2015-09-03 DIAGNOSIS — K592 Neurogenic bowel, not elsewhere classified: Secondary | ICD-10-CM

## 2015-09-03 DIAGNOSIS — F1290 Cannabis use, unspecified, uncomplicated: Secondary | ICD-10-CM | POA: Insufficient documentation

## 2015-09-03 DIAGNOSIS — Z0289 Encounter for other administrative examinations: Secondary | ICD-10-CM

## 2015-09-03 DIAGNOSIS — F32A Depression, unspecified: Secondary | ICD-10-CM

## 2015-09-03 MED ORDER — FLUOXETINE HCL 40 MG PO CAPS: 40.0000 mg | ORAL_CAPSULE | Freq: Every day | ORAL | Status: DC

## 2015-09-03 MED ORDER — METHADONE HCL 10 MG PO TABS: ORAL_TABLET | ORAL | Status: DC

## 2015-09-03 MED ORDER — OXYCODONE-ACETAMINOPHEN 10-325 MG PO TABS: 1.0000 | ORAL_TABLET | Freq: Three times a day (TID) | ORAL | Status: DC | PRN

## 2015-09-03 NOTE — Progress Notes (Signed)
Subjective:    Patient ID: Billy Kelley, male    DOB: 1976/01/25, 40 y.o.   MRN: [id number]  HPI: [name] is a 40 year old male who returns for follow up for chronic pain and medication refill. He says his pain is located in his right arm and lower back. He rates his pain 5.His current exercise regime is walking, performing stretching exercises. [name] admits to being depressed, very tearful. He brought in  Prozac 40 mg daily from a year ago, he asked if he could be prescribed Prozac again.  He states when he was living in [place] he was prescribed Prozac and his depression was under control. States when he went to [place] they wouldn't prescribe Prozac and he was prescribed Cymbalta and states he has been compliant with medication regime. Also states his son has been experiencing being bullied at school and his son had a break down and was admitted for a psychiatric evaluation. He admits this has impacted his depression. He has tried on numerous occassions to obtain a referral for counseling and was denied due to his insurance. He has [employer], he was given a pamphlet for [facility] and a referral has been placed.  Mother in the room and concurs with the above.  [name] denies suicidal ideation or a plan. Will discuss with Dr. Riley Kelley and will return the call. He verbalizes understanding.  Spoke with Billy Kelley Outpatient pharmacist at 15:00 regarding any adverse effects with  changing Cymbalta to Prozac, the above was discussed with Dr. Riley Kelley.  He agrees with the plan of changing Cymbalta to Prozac. Placed a call to [name] he is aware to discontinue the Cymbalta and Prozac was prescribed. He verbalizes understanding. Also states he has an appointment at [facility] on Thursday 09/05/2015.  Pain Inventory Average Pain 5 Pain Right Now 5 My pain is constant, sharp and aching  In the last 24 hours, has pain interfered with the following? General  activity 7 Relation with others 7 Enjoyment of life 7 What TIME of day is your pain at its worst? night and morning Sleep (in general) Fair  Pain is worse with: walking Pain improves with: medication and injections Relief from Meds: 5  Mobility walk without assistance use a cane how many minutes can you walk? 15 ability to climb steps?  yes do you drive?  yes Do you have any goals in this area?  yes  Function disabled: date disabled na  Neuro/Psych bowel control problems numbness spasms depression anxiety suicidal thoughts  Prior Studies Any changes since last visit?  no  Physicians involved in your care Any changes since last visit?  no   Family History  Problem Relation Age of Onset  . Hypertension Mother   . Hyperlipidemia Mother   . Cancer Mother 64    endometrial   . Heart disease Father     CHF   . Diabetes Neg Hx    Social History   Social History  . Marital Status: Single    Spouse Name: N/A  . Number of Children: 1   . Years of Education: N/A   Occupational History  . Disability      [employer]    Social History Main Topics  . Smoking status: Current Every Day Smoker -- 0.50 packs/day for 20 years    Types: Cigarettes  . Smokeless tobacco: Never Used  . Alcohol Use: No  . Drug Use: Yes    Special:  Marijuana     Comment: 2-3 times week   . Sexual Activity: Yes    Birth Control/ Protection: Condom   Other Topics Concern  . None   Social History Narrative   Lives with mom.   Moved from [place] most recently. Also live in [place] in [place].          Past Surgical History  Procedure Laterality Date  . Spinal cord fusion  2005     C4-C6   . Hernia repair Bilateral 1976  . Brain surgery     Past Medical History  Diagnosis Date  . 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 Kidney stone 2012    passed with fluids    BP 120/69 mmHg  Pulse 90  SpO2  100%  Opioid Risk Score:   Fall Risk Score:  `1  Depression screen PHQ 2/9  Depression screen [facility] 2/9 03/19/2015 02/04/2015 10/01/2014 04/12/2014 04/12/2014 03/22/2014  Decreased Interest - 2  Down, Depressed, Hopeless 0 2  PHQ - 2 Score 0 4  Altered sleeping - - 1 2 - 1  Tired, decreased energy - - 2 3 - 2  Change in appetite - - 2 2 - 3  Feeling bad or failure about yourself  - - 2 2 - 1  Trouble concentrating - - 1 2 - 2  Moving slowly or fidgety/restless - - 0 1 - 0  Suicidal thoughts - - 1 2 - 2  PHQ-9 Score - - 11 19 - 15     Review of Systems     Objective:   Physical Exam  Constitutional: He is oriented to person, place, and time. He appears well-developed and well-nourished.  HENT:  Head: Normocephalic and atraumatic.  Neck: Normal range of motion. Neck supple.  Cardiovascular: Normal rate and regular rhythm.   Pulmonary/Chest: Effort normal and breath sounds normal.  Musculoskeletal:  Normal Muscle Bulk and Muscle Testing Reveals: Upper extremities: Right: Decreased ROM and Muscle Strength 2/5 Left: Full ROM and Muscle Strength 5/5 Lumbar Paraspinal Tenderness: L-3- L-5 Lower Extremities: Left: Full ROM and Muscle Strength 5/5 Right Lower Extremities: Decreased ROM and Muscle Strength 4/5 Arises from chair slowly Wide Based gait  Neurological: He is alert and oriented to person, place, and time.  Skin: Skin is warm and dry.  Psychiatric: He has a normal mood and affect.  Nursing note and vitals reviewed.         Assessment & Plan:  1. C5 fx/SCI with Billy Kelley Presentation: Refilled: Methadone 10 mg daily #30 and Oxycodone 7.5/325 mg one tablet every 8 hours as needed #90. Continue Gabapentin for nerve Pain 2. Spastic right hemiparesis: Continue Baclofen and Klonopin as prescribed 3. Neurogenic bowel: Continue with Bowel Regimen. Continue Movantik. 4. Reactive depression with anxiety, ?PTSD. Cymbalta Discontinued. RX: Prozac. Has  a  Psychiatry appointment 09/05/15 5. Insomnia: Continue Elavil  30 minutes of face to face patient care time was spent during this visit. All questions were encouraged and answered.  F/U in 1 month

## 2015-09-05 DIAGNOSIS — F333 Major depressive disorder, recurrent, severe with psychotic symptoms: Secondary | ICD-10-CM | POA: Diagnosis not present

## 2015-09-05 DIAGNOSIS — F332 Major depressive disorder, recurrent severe without psychotic features: Secondary | ICD-10-CM | POA: Diagnosis not present

## 2015-09-07 LAB — TOXASSURE SELECT,+ANTIDEPR,UR: PDF: 0

## 2015-09-09 DIAGNOSIS — F333 Major depressive disorder, recurrent, severe with psychotic symptoms: Secondary | ICD-10-CM | POA: Diagnosis not present

## 2015-09-09 DIAGNOSIS — F332 Major depressive disorder, recurrent severe without psychotic features: Secondary | ICD-10-CM | POA: Diagnosis not present

## 2015-09-10 ENCOUNTER — Other Ambulatory Visit: Payer: Self-pay | Admitting: Physical Medicine & Rehabilitation

## 2015-09-11 ENCOUNTER — Telehealth: Payer: Self-pay | Admitting: *Deleted

## 2015-09-11 NOTE — Telephone Encounter (Signed)
Kol called back. See addendum note to 09/03/15 visit.

## 2015-09-11 NOTE — Progress Notes (Signed)
Urine drug screen is inconsistent. Prescribed medications are present but there is evidence of unprescribed amphetamine in his urine.  No record of ever being prescribed by [facility].  I called and spoke with Billy Kelley and he admits to taking a friend's medication.  He states that he has talked with Dr Zachery Conch (?) Psych at [facility] about his difficulty staying awake but she wanted to try him on a different anti-depressant first"  This information is  from [place], not from [facility].  I cautioned him about taking someone else's medication--that is a contract violation. He understands and will talk to his psychiatrist more about the medication.

## 2015-09-11 NOTE — Telephone Encounter (Signed)
Left voicemail for Billy Kelley to call us back so that I may ask him about amphetamine in his urine drug screen

## 2015-09-12 ENCOUNTER — Other Ambulatory Visit: Payer: Self-pay | Admitting: Physical Medicine & Rehabilitation

## 2015-09-16 DIAGNOSIS — F333 Major depressive disorder, recurrent, severe with psychotic symptoms: Secondary | ICD-10-CM | POA: Diagnosis not present

## 2015-09-16 DIAGNOSIS — F332 Major depressive disorder, recurrent severe without psychotic features: Secondary | ICD-10-CM | POA: Diagnosis not present

## 2015-09-23 ENCOUNTER — Encounter: Payer: Self-pay | Admitting: Physical Medicine & Rehabilitation

## 2015-09-24 ENCOUNTER — Other Ambulatory Visit: Payer: Self-pay | Admitting: Physical Medicine & Rehabilitation

## 2015-09-30 ENCOUNTER — Encounter: Payer: Self-pay | Admitting: Physical Medicine & Rehabilitation

## 2015-09-30 ENCOUNTER — Encounter: Payer: Worker's Compensation | Attending: Registered Nurse | Admitting: Physical Medicine & Rehabilitation

## 2015-09-30 DIAGNOSIS — F1290 Cannabis use, unspecified, uncomplicated: Secondary | ICD-10-CM | POA: Diagnosis not present

## 2015-09-30 DIAGNOSIS — G894 Chronic pain syndrome: Secondary | ICD-10-CM

## 2015-09-30 DIAGNOSIS — Z79899 Other long term (current) drug therapy: Secondary | ICD-10-CM

## 2015-09-30 DIAGNOSIS — S14145S Brown-Sequard syndrome at C5 level of cervical spinal cord, sequela: Secondary | ICD-10-CM | POA: Diagnosis not present

## 2015-09-30 DIAGNOSIS — F329 Major depressive disorder, single episode, unspecified: Secondary | ICD-10-CM | POA: Insufficient documentation

## 2015-09-30 DIAGNOSIS — F419 Anxiety disorder, unspecified: Secondary | ICD-10-CM | POA: Diagnosis not present

## 2015-09-30 DIAGNOSIS — Z5181 Encounter for therapeutic drug level monitoring: Secondary | ICD-10-CM

## 2015-09-30 DIAGNOSIS — G8111 Spastic hemiplegia affecting right dominant side: Secondary | ICD-10-CM | POA: Diagnosis not present

## 2015-09-30 DIAGNOSIS — F32A Depression, unspecified: Secondary | ICD-10-CM

## 2015-09-30 MED ORDER — METHADONE HCL 10 MG PO TABS: ORAL_TABLET | ORAL | Status: DC

## 2015-09-30 MED ORDER — OXYCODONE-ACETAMINOPHEN 10-325 MG PO TABS
1.0000 | ORAL_TABLET | Freq: Three times a day (TID) | ORAL | Status: DC | PRN
Start: 2015-09-30 — End: 2015-10-23

## 2015-09-30 MED ORDER — FLUOXETINE HCL 20 MG PO CAPS
60.0000 mg | ORAL_CAPSULE | Freq: Every day | ORAL | Status: DC
Start: 2015-09-30 — End: 2016-04-01

## 2015-09-30 NOTE — Progress Notes (Signed)
Botox Injection for spasticity using needle EMG guidance Indication: G81.11 spastic hemiparesis right upper ext  Dilution: 100 Units/ml        Total Units Injected: 300 Indication: Severe spasticity which interferes with ADL,mobility and/or  hygiene and is unresponsive to medication management and other conservative care Informed consent was obtained after describing risks and benefits of the procedure with the patient. This includes bleeding, bruising, infection, excessive weakness, or medication side effects. A REMS form is on file and signed.  Needle: 50mm injectable monopolar needle electrode  Number of units per muscle Pectoralis Major 0 units Pectoralis Minor 0 units Biceps 0 units Brachioradialis 0 units FCR 25 units FCU 25 units FDS 100 units FDP 100 units FPL 0 units Pronator Teres 50 units Pronator Quadratus 0 units Quadriceps 0 units Gastroc/soleus 0 units Hamstrings 0 units Tibialis Posterior 0 units EHL 0 units All injections were done after obtaining appropriate EMG activity and after negative drawback for blood. The patient tolerated the procedure well. Post procedure instructions were given. A followup appointment was made for one month..   Also increased prozac to 60mg  daily to help with depression. Recommended ongoing follow up with [facility].

## 2015-10-08 ENCOUNTER — Other Ambulatory Visit: Payer: Self-pay | Admitting: Physical Medicine & Rehabilitation

## 2015-10-14 ENCOUNTER — Other Ambulatory Visit: Payer: Self-pay | Admitting: Physical Medicine & Rehabilitation

## 2015-10-23 ENCOUNTER — Encounter: Payer: Self-pay | Admitting: Registered Nurse

## 2015-10-23 ENCOUNTER — Encounter: Payer: Worker's Compensation | Attending: Registered Nurse | Admitting: Registered Nurse

## 2015-10-23 VITALS — BP 108/67 | HR 75 | Resp 14

## 2015-10-23 DIAGNOSIS — S14145S Brown-Sequard syndrome at C5 level of cervical spinal cord, sequela: Secondary | ICD-10-CM | POA: Diagnosis not present

## 2015-10-23 DIAGNOSIS — G8111 Spastic hemiplegia affecting right dominant side: Secondary | ICD-10-CM | POA: Diagnosis not present

## 2015-10-23 DIAGNOSIS — F329 Major depressive disorder, single episode, unspecified: Secondary | ICD-10-CM | POA: Diagnosis not present

## 2015-10-23 DIAGNOSIS — F1290 Cannabis use, unspecified, uncomplicated: Secondary | ICD-10-CM | POA: Diagnosis not present

## 2015-10-23 DIAGNOSIS — G894 Chronic pain syndrome: Secondary | ICD-10-CM

## 2015-10-23 DIAGNOSIS — Z5181 Encounter for therapeutic drug level monitoring: Secondary | ICD-10-CM

## 2015-10-23 DIAGNOSIS — F419 Anxiety disorder, unspecified: Secondary | ICD-10-CM | POA: Diagnosis not present

## 2015-10-23 DIAGNOSIS — F32A Depression, unspecified: Secondary | ICD-10-CM

## 2015-10-23 DIAGNOSIS — Z79899 Other long term (current) drug therapy: Secondary | ICD-10-CM

## 2015-10-23 MED ORDER — METHADONE HCL 10 MG PO TABS: ORAL_TABLET | ORAL | Status: DC

## 2015-10-23 MED ORDER — OXYCODONE-ACETAMINOPHEN 10-325 MG PO TABS: 1.0000 | ORAL_TABLET | Freq: Three times a day (TID) | ORAL | Status: DC | PRN

## 2015-10-23 NOTE — Progress Notes (Signed)
Subjective:    Patient ID: Billy Kelley, male    DOB: Jun 27, 1976, 40 y.o.   MRN: [id number]  HPI: Billy Kelley is a 40 year old male who returns for follow up for chronic pain and medication refill. He states his pain is located in his right arm and he is experiencing increase muscles spasticity in his right lower extremity and decreased ROM. Spoke with Dr. Riley KillSwartz he agrees with Botox, [name]  has been receiving Botox in his right upper extremity, he would also benefit from Botox Injections for his right lower extremity. This is to verify the medical necessity for Botox injection into the right lower extremity. Mr. Billy ChurnScott Michalskiis a 40 y.o. male with spastic right hemiparesis, C5 FX/ SCI with Billy Kelley Sequard presentation resulting in muscle spasticity and decreased range of motion. The treatment plan is to decrease his muscle spasticity and increase his range of motion. This is a combination of Botox injections and Klonopin.The goal is to increase his range of motion and prevent muscle contractures. Injections will be repeated at 3-4 month intervals with assessments following at 1 month intervals to evaluate effectiveness.   He rates his pain 4.His current exercise regime is walking, performing stretching exercises. [name] states his son has been re-admitted for a psychiatric evaluation yesterday 10/22/2015. Emotional support given. He received a text during our visit, his son was being discharged. He states he is very happy. [facility] and he has an appointment tomorrow at 11:00 am.  Pain Inventory Average Pain 4 Pain Right Now 4 My pain is constant, sharp and dull  In the last 24 hours, has pain interfered with the following? General activity 5 Relation with others 5 Enjoyment of life 5 What TIME of day is your pain at its worst? morning Sleep (in general) Fair  Pain is worse with: walking Pain improves with: rest, medication and  injections Relief from Meds: 5  Mobility walk without assistance walk with assistance use a cane how many minutes can you walk? 10 ability to climb steps?  yes do you drive?  yes Do you have any goals in this area?  no  Function disabled: date disabled . I need assistance with the following:  meal prep Do you have any goals in this area?  yes  Neuro/Psych bowel control problems numbness tremor trouble walking spasms depression anxiety  Prior Studies Any changes since last visit?  no  Physicians involved in your care Any changes since last visit?  no   Family History  Problem Relation Age of Onset  . Hypertension Mother   . Hyperlipidemia Mother   . Cancer Mother 7050    endometrial   . Heart disease Father     CHF   . Diabetes Neg Hx    Social History   Social History  . Marital Status: Single    Spouse Name: N/A  . Number of Children: 1   . Years of Education: N/A   Occupational History  . Disability      [employer]    Social History Main Topics  . Smoking status: Current Every Day Smoker -- 0.50 packs/day for 20 years    Types: Cigarettes  . Smokeless tobacco: Never Used  . Alcohol Use: No  . Drug Use: Yes    Special: Marijuana     Comment: 2-3 times week   . Sexual Activity: Yes    Birth Control/ Protection: Condom   Other Topics  Concern  . None   Social History Narrative   Lives with mom.   Moved from [place] most recently. Also live in [place] in [place].          Past Surgical History  Procedure Laterality Date  . Spinal cord fusion  2005     C4-C6   . Hernia repair Bilateral 1976  . Brain surgery     Past Medical History  Diagnosis Date  . 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 Kidney stone 2012    passed with fluids    BP 108/67 mmHg  Pulse 75  Resp 14  SpO2 96%  Opioid Risk Score:   Fall Risk Score:  `1  Depression screen PHQ  2/9  Depression screen [facility] 2/9 10/23/2015 09/30/2015 03/19/2015 02/04/2015 10/01/2014 04/12/2014 04/12/2014  Decreased Interest -  Down, Depressed, Hopeless 0  PHQ - 2 Score 0  Altered sleeping 1 - - - 1 2 -  Tired, decreased energy 3 - - - 2 3 -  Change in appetite 0 - - - 2 2 -  Feeling bad or failure about yourself  1 - - - 2 2 -  Trouble concentrating 3 - - - 1 2 -  Moving slowly or fidgety/restless 0 - - - 0 1 -  Suicidal thoughts 0 - - - 1 2 -  PHQ-9 Score 12 - - - 11 19 -  Difficult doing work/chores Very difficult - - - - - -      Review of Systems  All other systems reviewed and are negative.      Objective:   Physical Exam  Constitutional: He is oriented to person, place, and time. He appears well-developed and well-nourished.  HENT:  Head: Normocephalic and atraumatic.  Neck: Normal range of motion. Neck supple.  Cardiovascular: Normal rate and regular rhythm.   Pulmonary/Chest: Effort normal and breath sounds normal.  Musculoskeletal:  Normal Muscle Bulk and Muscle Testing Reveals: Upper Extremities:Left: Full ROM and Muscle Strength 5/5 Right: Decreased ROM 90 Degrees and Muscle Strength 2/5 Lower Extremities: Right: Decreased ROM and Muscle Strength 4/5 Wearing Right AFO Left: Full ROM and Muscle Strength 5/5 Arises from chair slowly Narrow Based Gait   Neurological: He is alert and oriented to person, place, and time.  Skin: Skin is warm and dry.  Psychiatric: He has a normal mood and affect.  Nursing note and vitals reviewed.         Assessment & Plan:  1. C5 fx/SCI with Billy Joe Presentation: Refilled: Methadone 10 mg daily #30 and Oxycodone 7.5/325 mg one tablet every 8 hours as needed #90. We will continue the opioid monitoring program, this consists of regular clinic visits, examinations, urine drug screen, pill counts as well as use of [place] Controlled Substance Reporting System.  Continue Gabapentin  for nerve Pain 2. Spastic right hemiparesis: Continue Baclofen and Klonopin as prescribed 3. Neurogenic bowel: Continue with Bowel Regimen. Continue Movantik. 4. Reactive depression with anxiety, ?PTSD. Continue: Prozac. Psychiatry Following: [facility]  5. Insomnia: Continue Elavil  30 minutes of face to face patient care time was spent during this visit. All questions were encouraged and answered.  F/U in 1 month

## 2015-10-24 DIAGNOSIS — F332 Major depressive disorder, recurrent severe without psychotic features: Secondary | ICD-10-CM | POA: Diagnosis not present

## 2015-11-07 ENCOUNTER — Other Ambulatory Visit: Payer: Self-pay | Admitting: Physical Medicine & Rehabilitation

## 2015-11-18 DIAGNOSIS — F332 Major depressive disorder, recurrent severe without psychotic features: Secondary | ICD-10-CM | POA: Diagnosis not present

## 2015-11-26 ENCOUNTER — Encounter: Payer: Self-pay | Admitting: Registered Nurse

## 2015-11-26 ENCOUNTER — Encounter: Payer: Worker's Compensation | Attending: Registered Nurse | Admitting: Registered Nurse

## 2015-11-26 VITALS — BP 116/65 | HR 93

## 2015-11-26 DIAGNOSIS — F419 Anxiety disorder, unspecified: Secondary | ICD-10-CM | POA: Diagnosis not present

## 2015-11-26 DIAGNOSIS — F329 Major depressive disorder, single episode, unspecified: Secondary | ICD-10-CM | POA: Diagnosis not present

## 2015-11-26 DIAGNOSIS — F32A Depression, unspecified: Secondary | ICD-10-CM

## 2015-11-26 DIAGNOSIS — Z79899 Other long term (current) drug therapy: Secondary | ICD-10-CM

## 2015-11-26 DIAGNOSIS — S14145S Brown-Sequard syndrome at C5 level of cervical spinal cord, sequela: Secondary | ICD-10-CM | POA: Insufficient documentation

## 2015-11-26 DIAGNOSIS — G894 Chronic pain syndrome: Secondary | ICD-10-CM

## 2015-11-26 DIAGNOSIS — G8111 Spastic hemiplegia affecting right dominant side: Secondary | ICD-10-CM | POA: Diagnosis not present

## 2015-11-26 DIAGNOSIS — F1290 Cannabis use, unspecified, uncomplicated: Secondary | ICD-10-CM | POA: Diagnosis not present

## 2015-11-26 DIAGNOSIS — Z5181 Encounter for therapeutic drug level monitoring: Secondary | ICD-10-CM

## 2015-11-26 MED ORDER — METHADONE HCL 10 MG PO TABS: ORAL_TABLET | ORAL | Status: DC

## 2015-11-26 MED ORDER — OXYCODONE-ACETAMINOPHEN 10-325 MG PO TABS: 1.0000 | ORAL_TABLET | Freq: Three times a day (TID) | ORAL | Status: DC | PRN

## 2015-11-26 NOTE — Progress Notes (Signed)
Subjective:    Patient ID: Billy Kelley, male    DOB: [birth year], 40 y.o.   MRN: [id number]  HPI: [name] is a 40 year old male who returns for follow up for chronic pain and medication refill. He states his pain is located in his right arm and he is experiencing increase muscles spasticity in his right lower extremity and decreased ROM. He rates his pain 5. His current exercise regime is walking and performing stretching exercises.  [name] forgot his medications bottle, educated on the narcotic policy. He is aware he must have his medications at every appointment or he will have to go home and bring the medication bottles in, he verbalizes understanding.  [facility] reviewed Oxycodone picked up 11/01/15 and Methadone picked up on 11/10/2015. [name] has a psychiatrist [name] he has prescribed Adderall.  Pain Inventory Average Pain 5 Pain Right Now 5 My pain is constant, sharp, stabbing and aching  In the last 24 hours, has pain interfered with the following? General activity 5 Relation with others 5 Enjoyment of life 5 What TIME of day is your pain at its worst? morning Sleep (in general) Fair  Pain is worse with: walking and some activites Pain improves with: medication and injections Relief from Meds: 5  Mobility walk with assistance use a cane how many minutes can you walk? 5 ability to climb steps?  yes do you drive?  yes Do you have any goals in this area?  yes  Function disabled: date disabled NA I need assistance with the following:  meal prep Do you have any goals in this area?  no  Neuro/Psych bowel control problems weakness numbness depression anxiety  Prior Studies Any changes since last visit?  no  Physicians involved in your care Any changes since last visit?  no   Family History  Problem Relation Age of Onset  . Hypertension Mother   . Hyperlipidemia Mother   . Cancer Mother 56    endometrial   . Heart disease Father      CHF   . Diabetes Neg Hx    Social History   Social History  . Marital Status: Single    Spouse Name: N/A  . Number of Children: 1   . Years of Education: N/A   Occupational History  . Disability      [employer]    Social History Main Topics  . Smoking status: Current Every Day Smoker -- 0.50 packs/day for 20 years    Types: Cigarettes  . Smokeless tobacco: Never Used  . Alcohol Use: No  . Drug Use: Yes    Special: Marijuana     Comment: 2-3 times week   . Sexual Activity: Yes    Birth Control/ Protection: Condom   Other Topics Concern  . None   Social History Narrative   Lives with mom.   Moved from [place] most recently. Also live in [place] in [place].          Past Surgical History  Procedure Laterality Date  . Spinal cord fusion  2005     C4-C6   . Hernia repair Bilateral 1976  . Brain surgery     Past Medical History  Diagnosis Date  . 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 Kidney stone 2012    passed with fluids    BP 116/65  mmHg  Pulse 93  SpO2 95%  Opioid Risk Score:   Fall Risk Score:  `1  Depression screen PHQ 2/9  [facility] 10/23/2015 09/30/2015 03/19/2015 02/04/2015 10/01/2014 04/12/2014 04/12/2014  Decreased Interest 2 1 1 1 1 3  -  Down, Depressed, Hopeless 2 1 1 1 1 2  0  PHQ - 2 Score 4 2 2 2 2 5  0  Altered sleeping 1 - - - 1 2 -  Tired, decreased energy 3 - - - 2 3 -  Change in appetite 0 - - - 2 2 -  Feeling bad or failure about yourself  1 - - - 2 2 -  Trouble concentrating 3 - - - 1 2 -  Moving slowly or fidgety/restless 0 - - - 0 1 -  Suicidal thoughts 0 - - - 1 2 -  PHQ-9 Score 12 - - - 11 19 -  Difficult doing work/chores Very difficult - - - - - -      Review of Systems  Gastrointestinal: Positive for constipation.       Bowel Control Problems  Neurological: Positive for weakness and numbness.  Psychiatric/Behavioral:        Depression Anxiety       Objective:   Physical Exam  Constitutional: He is oriented to person, place, and time. He appears well-developed and well-nourished.  HENT:  Head: Normocephalic and atraumatic.  Neck: Normal range of motion. Neck supple.  Cardiovascular: Normal rate and regular rhythm.   Pulmonary/Chest: Effort normal and breath sounds normal.  Musculoskeletal:  Normal Muscle Bulk and Muscle Testing Reveals: Upper Extremities: Left: Full ROM and Muscle Strength 5/5 Right: Decrease ROM and Muscle Strength 2/5 Lower Extremities: Right: Decreased ROM and Muscle Strength 4/5 Right AFO Left: Full ROM and Muscle Strength 5/5 Arises from chair slowly Narrow Based Gait   Neurological: He is alert and oriented to person, place, and time.  Skin: Skin is warm and dry.  Psychiatric: He has a normal mood and affect.  Nursing note and vitals reviewed.         Assessment & Plan:  1. C5 fx/SCI with [name] Presentation: Refilled: Methadone 10 mg daily #30 and Oxycodone 7.5/325 mg one tablet every 8 hours as needed #90. We will continue the opioid monitoring program, this consists of regular clinic visits, examinations, urine drug screen, pill counts as well as use of [place] Cont[place] Substance reporting System  Continue Gabapentin for nerve Pain 2. Spastic right hemiparesis: Continue Baclofen and Klonopin as prescribed 3. Neurogenic bowel: Continue with Bowel Regimen. Continue Movantik. 4. Reactive depression with anxiety, ?PTSD. Cymbalta Discontinue Prozac.  Psychiatry Following 5. Insomnia: Continue Elavil  30 minutes of face to face patient care time was spent during this visit. All questions were encouraged and answered.  F/U in 1 month

## 2015-12-03 ENCOUNTER — Other Ambulatory Visit: Payer: Self-pay | Admitting: Physical Medicine & Rehabilitation

## 2015-12-10 ENCOUNTER — Other Ambulatory Visit: Payer: Self-pay | Admitting: Family Medicine

## 2015-12-10 MED ORDER — GABAPENTIN 300 MG PO CAPS: 600.0000 mg | ORAL_CAPSULE | Freq: Three times a day (TID) | ORAL | Status: DC

## 2015-12-30 ENCOUNTER — Encounter: Payer: Worker's Compensation | Attending: Registered Nurse | Admitting: Physical Medicine & Rehabilitation

## 2015-12-30 ENCOUNTER — Encounter: Payer: Self-pay | Admitting: Physical Medicine & Rehabilitation

## 2015-12-30 VITALS — BP 108/74 | HR 84 | Resp 14

## 2015-12-30 DIAGNOSIS — S14145S Brown-Sequard syndrome at C5 level of cervical spinal cord, sequela: Secondary | ICD-10-CM | POA: Insufficient documentation

## 2015-12-30 DIAGNOSIS — G8111 Spastic hemiplegia affecting right dominant side: Secondary | ICD-10-CM | POA: Diagnosis not present

## 2015-12-30 DIAGNOSIS — G894 Chronic pain syndrome: Secondary | ICD-10-CM

## 2015-12-30 DIAGNOSIS — F329 Major depressive disorder, single episode, unspecified: Secondary | ICD-10-CM | POA: Diagnosis not present

## 2015-12-30 DIAGNOSIS — Z5181 Encounter for therapeutic drug level monitoring: Secondary | ICD-10-CM

## 2015-12-30 DIAGNOSIS — Z79899 Other long term (current) drug therapy: Secondary | ICD-10-CM

## 2015-12-30 DIAGNOSIS — F1290 Cannabis use, unspecified, uncomplicated: Secondary | ICD-10-CM | POA: Diagnosis not present

## 2015-12-30 DIAGNOSIS — F419 Anxiety disorder, unspecified: Secondary | ICD-10-CM | POA: Diagnosis not present

## 2015-12-30 MED ORDER — OXYCODONE-ACETAMINOPHEN 10-325 MG PO TABS: 1.0000 | ORAL_TABLET | Freq: Three times a day (TID) | ORAL | Status: DC | PRN

## 2015-12-30 MED ORDER — METHADONE HCL 10 MG PO TABS: ORAL_TABLET | ORAL | Status: DC

## 2015-12-30 NOTE — Patient Instructions (Signed)
PLEASE CALL ME WITH ANY PROBLEMS OR QUESTIONS ([phone])  

## 2015-12-30 NOTE — Addendum Note (Signed)
Addended by: Honor LohGARRISON, Khian Remo M on: 12/30/2015 03:02 PM   Modules accepted: Orders

## 2015-12-30 NOTE — Progress Notes (Signed)
Botox Injection for spasticity using needle EMG guidance Indication: No diagnosis found.   Dilution: 100 Units/ml        Total Units Injected: 300 Indication: Severe spasticity which interferes with ADL,mobility and/or  hygiene and is unresponsive to medication management and other conservative care Informed consent was obtained after describing risks and benefits of the procedure with the patient. This includes bleeding, bruising, infection, excessive weakness, or medication side effects. A REMS form is on file and signed.  Needle: 50mm injectable monopolar needle electrode  Number of units per muscle Pectoralis Major 0 units Pectoralis Minor 0 units Biceps 0 units Brachioradialis 0 units FCR 25 units FCU 25 units FDS 100 units FDP 100 units FPL 0 units Pronator Teres 50 units Pronator Quadratus 0 units  All injections were done after obtaining appropriate EMG activity and after negative drawback for blood. The patient tolerated the procedure well. Post procedure instructions were given. No Follow-up on file.   Will reduce methadone to 5mg  daily

## 2015-12-30 NOTE — Progress Notes (Signed)
   Subjective:    Patient ID: Billy Kelley, male    DOB: May 10, 1976, 40 y.o.   MRN: [id number]  HPI    Review of Systems     Objective:   Physical Exam        Assessment & Plan:

## 2016-01-03 ENCOUNTER — Other Ambulatory Visit: Payer: Self-pay | Admitting: Physical Medicine & Rehabilitation

## 2016-01-05 LAB — TOXASSURE SELECT,+ANTIDEPR,UR: PDF: 0

## 2016-01-10 NOTE — Progress Notes (Signed)
Urine drug screen for this encounter is consistent for prescribed medications.   

## 2016-01-17 ENCOUNTER — Other Ambulatory Visit: Payer: Self-pay | Admitting: Physical Medicine & Rehabilitation

## 2016-01-17 NOTE — Telephone Encounter (Signed)
Klonopin called in

## 2016-01-27 ENCOUNTER — Encounter: Payer: Self-pay | Admitting: Registered Nurse

## 2016-01-27 ENCOUNTER — Encounter: Payer: Worker's Compensation | Attending: Registered Nurse | Admitting: Registered Nurse

## 2016-01-27 VITALS — BP 120/79 | HR 82

## 2016-01-27 DIAGNOSIS — G4700 Insomnia, unspecified: Secondary | ICD-10-CM

## 2016-01-27 DIAGNOSIS — F419 Anxiety disorder, unspecified: Secondary | ICD-10-CM | POA: Diagnosis not present

## 2016-01-27 DIAGNOSIS — F32A Depression, unspecified: Secondary | ICD-10-CM

## 2016-01-27 DIAGNOSIS — S14145S Brown-Sequard syndrome at C5 level of cervical spinal cord, sequela: Secondary | ICD-10-CM

## 2016-01-27 DIAGNOSIS — G8111 Spastic hemiplegia affecting right dominant side: Secondary | ICD-10-CM

## 2016-01-27 DIAGNOSIS — F1290 Cannabis use, unspecified, uncomplicated: Secondary | ICD-10-CM | POA: Diagnosis not present

## 2016-01-27 DIAGNOSIS — Z5181 Encounter for therapeutic drug level monitoring: Secondary | ICD-10-CM

## 2016-01-27 DIAGNOSIS — G894 Chronic pain syndrome: Secondary | ICD-10-CM

## 2016-01-27 DIAGNOSIS — F329 Major depressive disorder, single episode, unspecified: Secondary | ICD-10-CM

## 2016-01-27 DIAGNOSIS — Z79899 Other long term (current) drug therapy: Secondary | ICD-10-CM

## 2016-01-27 MED ORDER — GABAPENTIN 300 MG PO CAPS: ORAL_CAPSULE | ORAL | Status: DC

## 2016-01-27 MED ORDER — TRAZODONE HCL 50 MG PO TABS: ORAL_TABLET | ORAL | Status: DC

## 2016-01-27 MED ORDER — OXYCODONE-ACETAMINOPHEN 10-325 MG PO TABS: 1.0000 | ORAL_TABLET | Freq: Three times a day (TID) | ORAL | Status: DC | PRN

## 2016-01-27 MED ORDER — METHADONE HCL 10 MG PO TABS
ORAL_TABLET | ORAL | Status: DC
Start: 2016-01-27 — End: 2016-02-27

## 2016-01-27 NOTE — Patient Instructions (Signed)
Start Trazodone 1/2 tablet at bedtime for four  Nights  If no relief. Can increase to one tablet at bedtime

## 2016-01-27 NOTE — Progress Notes (Signed)
Subjective:    Patient ID: Billy Kelley, male    DOB: 09/26/1975, 40 y.o.   MRN: [id number]  HPI: [name] is a 40 year old male who returns for follow up for chronic pain and medication refill. He states his pain is located in his neck and right arm. He rates his pain 3. His current exercise regime is walking and performing stretching exercises.  Also states about 2 months ago [place], S/P Botox with good results noted.  Pain Inventory Average Pain 4 Pain Right Now 3 My pain is constant  In the last 24 hours, has pain interfered with the following? General activity 3 Relation with others 3 Enjoyment of life 5 What TIME of day is your pain at its worst? morning and night Sleep (in general) morning and night  Pain is worse with: walking Pain improves with: medication and injections Relief from Meds: 5  Mobility walk without assistance walk with assistance how many minutes can you walk? 10 ability to climb steps?  yes do you drive?  yes  Function disabled: date disabled . I need assistance with the following:  meal prep  Neuro/Psych bowel control problems weakness numbness trouble walking spasms depression anxiety  Prior Studies Any changes since last visit?  no  Physicians involved in your care Any changes since last visit?  no   Family History  Problem Relation Age of Onset  . Hypertension Mother   . Hyperlipidemia Mother   . Cancer Mother [id number]    endometrial   . Heart disease Father     CHF   . Diabetes Neg Hx    Social History   Social History  . Marital Status: Single    Spouse Name: N/A  . Number of Children: 1   . Years of Education: N/A   Occupational History  . Disability      [employer]    Social History Main Topics  . Smoking status: Current Every Day Smoker -- 0.50 packs/day for 20 years    Types: Cigarettes  . Smokeless tobacco: Never Used  . Alcohol Use: No  . Drug Use: Yes    Special: Marijuana   Comment: 2-3 times week   . Sexual Activity: Yes    Birth Control/ Protection: Condom   Other Topics Concern  . None   Social History Narrative   Lives with mom.   [place]oved from [place] recently. Also live in [place]   Son in [place].          Past Surgical History  Procedure Laterality Date  . Spinal cord fusion  2005     C4-C6   . Hernia repair Bilateral 1976  . Brain surgery     Past Medical History  Diagnosis Date  . 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 Kidney stone 2012    passed with fluids    BP 120/79 mmHg  Pulse 82  SpO2 96%  Opioid Risk Score:   Fall Risk Score:  `1  Depression screen PHQ 2/9  Dep[facility] 10/23/2015 09/30/2015 03/19/2015 02/04/2015 10/01/2014 04/12/2014 04/12/2014  Decreased Interest 2 1 1 1 1 3  -  Down, Depressed, Hopeless 2 1 1 1 1 2  0  PHQ - 2 Score 4 2 2 2 2 5  0  Altered sleeping 1 - - - 1 2 -  Tired, decreased energy 3 - - - 2 3 -  Change in appetite 0 - - - 2 2 -  Feeling bad or failure about yourself  1 - - - 2 2 -  Trouble concentrating 3 - - - 1 2 -  Moving slowly or fidgety/restless 0 - - - 0 1 -  Suicidal thoughts 0 - - - 1 2 -  PHQ-9 Score 12 - - - 11 19 -  Difficult doing work/chores Very difficult - - - - - -     Review of Systems  All other systems reviewed and are negative.      Objective:   Physical Exam  Constitutional: He is oriented to person, place, and time. He appears well-developed and well-nourished.  HENT:  Head: Normocephalic and atraumatic.  Neck: Normal range of motion. Neck supple.  Cardiovascular: Normal rate and regular rhythm.   Pulmonary/Chest: Effort normal and breath sounds normal.  Musculoskeletal:  Normal Muscle Bulk and Muscle Testing Reveals: Upper Extremities: Left: Full ROM and Muscle Strength 5/5 Right: Decreased ROM 90 Degrees and Muscle Strength 2/5 Lower Extremities: Right: Decreased ROM  wearing AFO Left: Full ROM and Muscle Strength 5/5 Arises from Table slowly Wide Based Gait  Neurological: He is alert and oriented to person, place, and time.  Skin: Skin is warm and dry.  Psychiatric: He has a normal mood and affect.  Nursing note and vitals reviewed.         Assessment & Plan:  1. C5 fx/SCI with [name] Presentation: Refilled: Methadone 10 mg daily #30 and Oxycodone 710/325 mg one tablet every 8 hours as needed #90. We will continue the opioid monitoring program, this consists of regular clinic visits, examinations, urine drug screen, pill counts as well as use of [place] Controlled Substance reporting System  Continue Gabapentin for nerve Pain 2. Spastic right hemiparesis: Continue Baclofen and Klonopin as prescribed 3. Neurogenic bowel: Continue with Bowel Regimen. Continue Movantik. 4. Reactive depression with anxiety, ?PTSD. Continue Adderall and  Prozac.  Psychiatry Following: [name] 5. Insomnia: Continue Trazodone  30 minutes of face to face patient care time was spent during this visit. All questions were encouraged and answered.  F/U in 1 month

## 2016-02-06 DIAGNOSIS — G4733 Obstructive sleep apnea (adult) (pediatric): Secondary | ICD-10-CM | POA: Diagnosis not present

## 2016-02-09 ENCOUNTER — Other Ambulatory Visit: Payer: Self-pay | Admitting: Registered Nurse

## 2016-02-10 ENCOUNTER — Other Ambulatory Visit: Payer: Self-pay | Admitting: Physical Medicine & Rehabilitation

## 2016-02-12 DIAGNOSIS — F333 Major depressive disorder, recurrent, severe with psychotic symptoms: Secondary | ICD-10-CM | POA: Diagnosis not present

## 2016-02-12 DIAGNOSIS — F332 Major depressive disorder, recurrent severe without psychotic features: Secondary | ICD-10-CM | POA: Diagnosis not present

## 2016-02-26 DIAGNOSIS — F333 Major depressive disorder, recurrent, severe with psychotic symptoms: Secondary | ICD-10-CM | POA: Diagnosis not present

## 2016-02-27 ENCOUNTER — Encounter: Payer: Worker's Compensation | Attending: Registered Nurse | Admitting: Registered Nurse

## 2016-02-27 ENCOUNTER — Encounter: Payer: Self-pay | Admitting: Registered Nurse

## 2016-02-27 VITALS — BP 112/72 | HR 88 | Resp 14

## 2016-02-27 DIAGNOSIS — F419 Anxiety disorder, unspecified: Secondary | ICD-10-CM | POA: Diagnosis not present

## 2016-02-27 DIAGNOSIS — Z79899 Other long term (current) drug therapy: Secondary | ICD-10-CM

## 2016-02-27 DIAGNOSIS — G8111 Spastic hemiplegia affecting right dominant side: Secondary | ICD-10-CM | POA: Diagnosis not present

## 2016-02-27 DIAGNOSIS — G894 Chronic pain syndrome: Secondary | ICD-10-CM

## 2016-02-27 DIAGNOSIS — F329 Major depressive disorder, single episode, unspecified: Secondary | ICD-10-CM | POA: Diagnosis not present

## 2016-02-27 DIAGNOSIS — F1290 Cannabis use, unspecified, uncomplicated: Secondary | ICD-10-CM | POA: Insufficient documentation

## 2016-02-27 DIAGNOSIS — G4700 Insomnia, unspecified: Secondary | ICD-10-CM

## 2016-02-27 DIAGNOSIS — Z5181 Encounter for therapeutic drug level monitoring: Secondary | ICD-10-CM

## 2016-02-27 DIAGNOSIS — S14145S Brown-Sequard syndrome at C5 level of cervical spinal cord, sequela: Secondary | ICD-10-CM

## 2016-02-27 DIAGNOSIS — F32A Depression, unspecified: Secondary | ICD-10-CM

## 2016-02-27 MED ORDER — METHADONE HCL 10 MG PO TABS: ORAL_TABLET | ORAL | 0 refills | Status: DC

## 2016-02-27 MED ORDER — OXYCODONE-ACETAMINOPHEN 10-325 MG PO TABS: 1.0000 | ORAL_TABLET | Freq: Three times a day (TID) | ORAL | 0 refills | Status: DC | PRN

## 2016-02-27 NOTE — Progress Notes (Signed)
Subjective:    Patient ID: Billy Kelley, male    DOB: 1976/04/10, 40 y.o.   MRN: [id number]  HPI: Mr. Billy BuggyScott Kelley is a 40 year old male who returns for follow up for chronic pain and medication refill. He states his pain is located in his neck and right arm. He rates his pain 3. His current exercise regime is walking, fishing and performing stretching exercises.  Scheduled Botox next month with Dr. Riley KillSwartz.   Pain Inventory Average Pain 3 Pain Right Now 3 My pain is constant, sharp, dull and aching  In the last 24 hours, has pain interfered with the following? General activity 4 Relation with others 4 Enjoyment of life 6 What TIME of day is your pain at its worst? morning Sleep (in general) Fair  Pain is worse with: walking, inactivity, standing and some activites Pain improves with: medication and injections Relief from Meds: 5  Mobility walk with assistance use a cane how many minutes can you walk? 10 ability to climb steps?  yes do you drive?  yes Do you have any goals in this area?  yes  Function disabled: date disabled .  Neuro/Psych bowel control problems weakness numbness tremor trouble walking spasms depression anxiety  Prior Studies Any changes since last visit?  no  Physicians involved in your care Any changes since last visit?  no   Family History  Problem Relation Age of Onset  . Hypertension Mother   . Hyperlipidemia Mother   . Cancer Mother 1850    endometrial   . Heart disease Father     CHF   . Diabetes Neg Hx    Social History   Social History  . Marital status: Single    Spouse name: N/A  . Number of children: 1   . Years of education: N/A   Occupational History  . Disability      [employer]    Social History Main Topics  . Smoking status: Current Every Day Smoker    Packs/day: 0.50    Years: 20.00    Types: Cigarettes  . Smokeless tobacco: Never Used  . Alcohol use No  . Drug use:     Types: Marijuana     Comment:  2-3 times week   . Sexual activity: Yes    Birth control/ protection: Condom   Other Topics Concern  . None   Social History Narrative   Lives with mom.   Moved from [place] most recently. Also live in [place]   [place].          Past Surgical History:  Procedure Laterality Date  . BRAIN SURGERY    . HERNIA REPAIR Bilateral 1976  . spinal cord fusion  2005    C4-C6    Past Medical History:  Diagnosis Date  . Kidney stone 2012   passed with fluids   . 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, [employer] representativeconstruction, Statisticianbrick layer, shattered C5 and fused C4-C6    BP 112/72 (BP Location: Left Arm, Patient Position: Sitting, Cuff Size: Large)   Pulse 88   Resp 14   SpO2 94%   Opioid Risk Score:   Fall Risk Score:  `1  Depression screen PHQ 2/9  [facility] 10/23/2015 09/30/2015 03/19/2015 02/04/2015 10/01/2014 04/12/2014 04/12/2014  Decreased Interest 2 1 1 1 1 3  -  Down, Depressed, Hopeless 2 1 1 1 1 2  0  PHQ - 2 Score 4  2 2 2 2 5  0  Altered sleeping 1 - - - 1 2 -  Tired, decreased energy 3 - - - 2 3 -  Change in appetite 0 - - - 2 2 -  Feeling bad or failure about yourself  1 - - - 2 2 -  Trouble concentrating 3 - - - 1 2 -  Moving slowly or fidgety/restless 0 - - - 0 1 -  Suicidal thoughts 0 - - - 1 2 -  PHQ-9 Score 12 - - - 11 19 -  Difficult doing work/chores Very difficult - - - - - -    Review of Systems  Constitutional: Negative.   HENT: Negative.   Eyes: Negative.   Respiratory: Negative.   Cardiovascular: Negative.   Gastrointestinal: Positive for abdominal pain, constipation and diarrhea.  Endocrine: Negative.   Genitourinary: Negative.   Musculoskeletal: Positive for arthralgias and gait problem.       Spasms   Allergic/Immunologic: Negative.   Neurological: Positive for tremors and numbness.  Psychiatric/Behavioral: Positive for dysphoric mood. The patient is nervous/anxious.   All other systems reviewed and are  negative.      Objective:   Physical Exam  Constitutional: He is oriented to person, place, and time. He appears well-developed and well-nourished.  HENT:  Head: Normocephalic and atraumatic.  Neck: Normal range of motion. Neck supple.  Cardiovascular: Normal rate and regular rhythm.   Pulmonary/Chest: Effort normal and breath sounds normal.  Musculoskeletal:  Normal Muscle Bulk and Muscle Testing Reveals: Upper Extremities: Right: Decreased ROM 90 Degrees and Muscle Strength 2/5 Left: Full ROM and Muscle Strength 5/5 Lower Extremities: Right: Decreased ROM and  Muscle Strength 5/5 Left: Full ROM and Muscle Strength 5/5 Arises from Table slowly using walking stick  Wide Based Gait  Neurological: He is alert and oriented to person, place, and time.  Skin: Skin is warm and dry.  Psychiatric: He has a normal mood and affect.  Nursing note and vitals reviewed.         Assessment & Plan:  1. C5 fx/SCI with [name] Presentation: Refilled: Methadone 10 mg daily #30, (continue weaning) and Oxycodone 10/325 mg one tablet every 8 hours as needed #90. We will continue the opioid monitoring program, this consists of regular clinic visits, examinations, urine drug screen, pill counts as well as use of [place] Cont[place] Substance reporting System Continue Gabapentin for nerve Pain 2. Spastic right hemiparesis: Continue Baclofen and Klonopin as prescribed 3. Neurogenic bowel: Continue with Bowel Regimen. Continue Movantik. 4. Reactive depression with anxiety, ?PTSD. Continue Adderall and  Prozac. Psychiatry Following: Dr. Laveda Abberouse 5. Insomnia: Continue Trazodone  30 minutes of face to face patient care time was spent during this visit. All questions were encouraged and answered.  F/U in 1 month

## 2016-04-01 ENCOUNTER — Other Ambulatory Visit: Payer: Self-pay | Admitting: Physical Medicine & Rehabilitation

## 2016-04-01 ENCOUNTER — Encounter: Payer: Worker's Compensation | Attending: Registered Nurse | Admitting: Physical Medicine & Rehabilitation

## 2016-04-01 ENCOUNTER — Encounter: Payer: Self-pay | Admitting: Physical Medicine & Rehabilitation

## 2016-04-01 VITALS — BP 95/64 | HR 79 | Resp 14

## 2016-04-01 DIAGNOSIS — F1290 Cannabis use, unspecified, uncomplicated: Secondary | ICD-10-CM | POA: Insufficient documentation

## 2016-04-01 DIAGNOSIS — G8111 Spastic hemiplegia affecting right dominant side: Secondary | ICD-10-CM | POA: Diagnosis not present

## 2016-04-01 DIAGNOSIS — S14145S Brown-Sequard syndrome at C5 level of cervical spinal cord, sequela: Secondary | ICD-10-CM | POA: Diagnosis not present

## 2016-04-01 DIAGNOSIS — Z79899 Other long term (current) drug therapy: Secondary | ICD-10-CM

## 2016-04-01 DIAGNOSIS — F329 Major depressive disorder, single episode, unspecified: Secondary | ICD-10-CM

## 2016-04-01 DIAGNOSIS — Z5181 Encounter for therapeutic drug level monitoring: Secondary | ICD-10-CM

## 2016-04-01 DIAGNOSIS — F32A Depression, unspecified: Secondary | ICD-10-CM

## 2016-04-01 DIAGNOSIS — F419 Anxiety disorder, unspecified: Secondary | ICD-10-CM | POA: Diagnosis not present

## 2016-04-01 DIAGNOSIS — G894 Chronic pain syndrome: Secondary | ICD-10-CM

## 2016-04-01 MED ORDER — METHADONE HCL 10 MG PO TABS: ORAL_TABLET | ORAL | 0 refills | Status: DC

## 2016-04-01 MED ORDER — OXYCODONE-ACETAMINOPHEN 10-325 MG PO TABS: 1.0000 | ORAL_TABLET | Freq: Three times a day (TID) | ORAL | 0 refills | Status: DC | PRN

## 2016-04-01 NOTE — Patient Instructions (Signed)
PLEASE CALL ME WITH ANY PROBLEMS OR QUESTIONS ([phone])  

## 2016-04-01 NOTE — Progress Notes (Signed)
Botox Injection for spasticity using needle EMG guidance Indication: spastic hemiplegia G81.11  Dilution: 100 Units/ml        Total Units Injected: 300 units Indication: Severe spasticity which interferes with ADL,mobility and/or  hygiene and is unresponsive to medication management and other conservative care Informed consent was obtained after describing risks and benefits of the procedure with the patient. This includes bleeding, bruising, infection, excessive weakness, or medication side effects. A REMS form is on file and signed.  Needle: 50mm injectable monopolar needle electrode  Number of units per muscle  Quadriceps 0 units Gastroc/soleus 0 units Hamstrings 300 units---6 access points right leg Tibialis Posterior 0 units EHL 0 units All injections were done after obtaining appropriate EMG activity and after negative drawback for blood. The patient tolerated the procedure well. Post procedure instructions were given. Return in about 2 months (around 06/01/2016).  Refilled methadone---will try breaking in half and using 5mg  bid.  From there we can go to 5mg  daily  Second rx provided.

## 2016-04-06 ENCOUNTER — Other Ambulatory Visit: Payer: Self-pay | Admitting: Physical Medicine & Rehabilitation

## 2016-04-13 DIAGNOSIS — F333 Major depressive disorder, recurrent, severe with psychotic symptoms: Secondary | ICD-10-CM | POA: Diagnosis not present

## 2016-05-19 DIAGNOSIS — F332 Major depressive disorder, recurrent severe without psychotic features: Secondary | ICD-10-CM | POA: Diagnosis not present

## 2016-05-19 DIAGNOSIS — F333 Major depressive disorder, recurrent, severe with psychotic symptoms: Secondary | ICD-10-CM | POA: Diagnosis not present

## 2016-05-31 ENCOUNTER — Other Ambulatory Visit: Payer: Self-pay | Admitting: Registered Nurse

## 2016-06-01 ENCOUNTER — Encounter: Payer: Worker's Compensation | Attending: Registered Nurse | Admitting: Physical Medicine & Rehabilitation

## 2016-06-01 ENCOUNTER — Encounter: Payer: Self-pay | Admitting: Physical Medicine & Rehabilitation

## 2016-06-01 VITALS — BP 122/79 | HR 81 | Resp 16

## 2016-06-01 DIAGNOSIS — F1290 Cannabis use, unspecified, uncomplicated: Secondary | ICD-10-CM | POA: Insufficient documentation

## 2016-06-01 DIAGNOSIS — F419 Anxiety disorder, unspecified: Secondary | ICD-10-CM | POA: Diagnosis not present

## 2016-06-01 DIAGNOSIS — F329 Major depressive disorder, single episode, unspecified: Secondary | ICD-10-CM | POA: Diagnosis not present

## 2016-06-01 DIAGNOSIS — Z5181 Encounter for therapeutic drug level monitoring: Secondary | ICD-10-CM | POA: Diagnosis not present

## 2016-06-01 DIAGNOSIS — G8111 Spastic hemiplegia affecting right dominant side: Secondary | ICD-10-CM | POA: Insufficient documentation

## 2016-06-01 DIAGNOSIS — S14145S Brown-Sequard syndrome at C5 level of cervical spinal cord, sequela: Secondary | ICD-10-CM | POA: Diagnosis not present

## 2016-06-01 DIAGNOSIS — Z79899 Other long term (current) drug therapy: Secondary | ICD-10-CM | POA: Diagnosis not present

## 2016-06-01 DIAGNOSIS — G894 Chronic pain syndrome: Secondary | ICD-10-CM

## 2016-06-01 MED ORDER — CLONAZEPAM 0.5 MG PO TABS: 0.5000 mg | ORAL_TABLET | Freq: Three times a day (TID) | ORAL | 3 refills | Status: DC

## 2016-06-01 MED ORDER — OXYCODONE-ACETAMINOPHEN 10-325 MG PO TABS
1.0000 | ORAL_TABLET | Freq: Three times a day (TID) | ORAL | 0 refills | Status: DC | PRN
Start: 2016-06-01 — End: 2016-07-08

## 2016-06-01 MED ORDER — METHADONE HCL 10 MG PO TABS: ORAL_TABLET | ORAL | 0 refills | Status: DC

## 2016-06-01 MED ORDER — GABAPENTIN 600 MG PO TABS: 600.0000 mg | ORAL_TABLET | ORAL | 3 refills | Status: DC

## 2016-06-01 NOTE — Progress Notes (Signed)
Subjective:    Patient ID: Billy Kelley, male    DOB: 1975-07-14, 40 y.o.   MRN: [id number]  HPI   Billy Kelley is here in follow up of his chronic spastic hemiplegia. He adjusted his night time gabapentin to [phone] schedule which has helped his pain. He tends to have less pain during the day.   He is down to 5mg  daily of methadone. He has stopped cigarettes although he is vaping. He has noticed a difference in his breathing already.   The botox helped the tightness in his hamstrings, he has a lot less jerking in the legs. He has had increasing tightness in his right fingers and wrist flexors.    [place] [facility] [place] for [place]giving.    Pain Inventory Average Pain 5 Pain Right Now 3 My pain is constant, sharp, dull, stabbing and aching  In the last 24 hours, has pain interfered with the following? General activity 7 Relation with others 7 Enjoyment of life 7 What TIME of day is your pain at its worst? morning and night Sleep (in general) Poor  Pain is worse with: walking and standing Pain improves with: heat/ice, medication and injections Relief from Meds: 4  Mobility walk with assistance use a cane use a walker ability to climb steps?  yes do you drive?  yes  Function disabled: date disabled . I need assistance with the following:  meal prep  Neuro/Psych bowel control problems weakness numbness trouble walking spasms depression anxiety  Prior Studies Any changes since last visit?  no  Physicians involved in your care Any changes since last visit?  no   Family History  Problem Relation Age of Onset  . Hypertension Mother   . Hyperlipidemia Mother   . Cancer Mother [id number]    endometrial   . Heart disease Father     CHF   . Diabetes Neg Hx    Social History   Social History  . Marital status: Single    Spouse name: N/A  . Number of children: 1   . Years of education: N/A   Occupational History  . Disability      [employer]    Social  History Main Topics  . Smoking status: Current Every Day Smoker    Packs/day: 0.50    Years: 20.00    Types: Cigarettes  . Smokeless tobacco: Never Used  . Alcohol use No  . Drug use:     Types: Marijuana     Comment: 2-3 times week   . Sexual activity: Yes    Birth control/ protection: Condom   Other Topics Concern  . None   Social History Narrative   Lives with mom.   [place]oved from [place]. [place] live in [place]   Son in [place].          Past Surgical History:  Procedure Laterality Date  . BRAIN SURGERY    . HERNIA REPAIR Bilateral 1976  . spinal cord fusion  2005    C4-C6    Past Medical History:  Diagnosis Date  . Kidney stone 2012   passed with fluids   . 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, [employer] representativeconstruction, Statisticianbrick layer, shattered C5 and fused C4-C6    BP 122/79   Pulse 81   Resp 16   SpO2 95%   Opioid Risk Score:   Fall Risk Score:  `1  Depression screen PHQ 2/9  Depression [facility]  [facility] 06/01/2016 04/01/2016 10/23/2015 09/30/2015 03/19/2015 02/04/2015 10/01/2014  Decreased Interest 2 2 2 1 1 1 1   Down, Depressed, Hopeless 2 2 2 1 1 1 1   PHQ - 2 Score 4 4 4 2 2 2 2   Altered sleeping - - 1 - - - 1  Tired, decreased energy - - 3 - - - 2  Change in appetite - - 0 - - - 2  Feeling bad or failure about yourself  - - 1 - - - 2  Trouble concentrating - - 3 - - - 1  Moving slowly or fidgety/restless - - 0 - - - 0  Suicidal thoughts - - 0 - - - 1  PHQ-9 Score - - 12 - - - 11  Difficult doing work/chores - - Very difficult - - - -    Review of Systems  Constitutional: Negative.   HENT: Negative.   Eyes: Negative.   Respiratory: Negative.   Cardiovascular: Negative.   Gastrointestinal: Negative.   Endocrine: Negative.   Genitourinary: Negative.   Musculoskeletal: Negative.   Skin: Negative.   Neurological: Negative.   Hematological: Negative.   Psychiatric/Behavioral: Negative.   All other systems reviewed and are  negative.      Objective:   Physical Exam  General: Alert and oriented x 3, No apparent distress  HEENT: Head is normocephalic, atraumatic, PERRLA, EOMI, sclera anicteric, oral mucosa pink and moist, dentition intact, ext ear canals clear,  Neck: Supple  Heart: RRR Chest: cta Abdomen: NT, ND.  Extremities: skin warm  Skin: Clean and intact.  Neuro: Alert and oriented x3. CN exam intact. 3+ to 4/5 deltoid, bicep. Trace wrist and elbow extension, HI, 1/5 grip. Finger fexor tone RUE, ?lumbrical involvement-2/4. LT,PP 1+/2 RUE. LUE nearly 5/5 prox to distal. Normal sensory LUE. RLE: 2 HF, HE, 2- KE and KF, trace ADF/eversion, absent PF. Tone at ankle is 1-2/4. Fluctuating tone knee at tr to 1/4. Pt's LLE: trace to 0/2 sensation. Strength grossly 4 to 4+/5. Gait improved Truncal sensation 1/2 left side. R  Musculoskeletal: lmpoved pelvic symmetry while standing but uses substitution patterns and engages right pevlis and trunk to assist in swinging right leg. Still with diminished right knee bend during gait. W  Right lumbar paraspinals with spasms and tenderness with palpation. Psych: Pt's affect was generally appropriate. A little anxious but improved.  Assessment & Plan:   1. C5 fx/SCI with Billy Kelley Sequard Presentation  2. Spastic right hemiparesis  3. Neurogenic bowel  4. Reactive depression with anxiety, ?PTSD 5. Neuropathic pain due to #1    Plan:  1. Movantik 25mg  daily to continue  2. I Percocet 10/325 for breakthrough pain, q8 prn #90.  3. Maintain elavil to 50mg  qhs for sleep and nerve pain  4. Methadone: 5mg  daily with goal of dc'ing to off over the next two months.  5. Continue with neuro-rehab to work on ROM, spasticity, mobility, orthotic evaluation.  -AFO per [facility] has been beneficial - Botox to right wrist and finger/thumb flexors, consider lumbricals---300units -splinting/towel to help support right HI.   6. Continue baclofen for now. klonopin  to 0.5mg  q8.  7.continue with smoking cessation 8. For neuropathic pain increase gabapentin to [phone] daily 30 minutes of face to face patient care time were spent during this visit. All questions were encouraged and answered. I'll see him back in about a month for botox.

## 2016-06-01 NOTE — Patient Instructions (Signed)
PLEASE CALL ME WITH ANY PROBLEMS OR QUESTIONS ([phone])   HAPPY THANKSGIVING!!!!    

## 2016-06-08 LAB — TOXASSURE SELECT,+ANTIDEPR,UR

## 2016-06-16 ENCOUNTER — Telehealth: Payer: Self-pay | Admitting: *Deleted

## 2016-06-16 NOTE — Telephone Encounter (Signed)
Billy Kelley called and he says [facility] is out of [facility] [place]codone 10/325/ [place] over [place].  I called [place] [facility] and they have the medication.  Billy Kelley is to take his RX and Theatre stage managerwkman comp info to [place] to get his rx filled instead of being written for 5 325 oxycodone.

## 2016-06-30 ENCOUNTER — Other Ambulatory Visit: Payer: Self-pay | Admitting: Registered Nurse

## 2016-07-08 ENCOUNTER — Encounter: Payer: Self-pay | Admitting: Physical Medicine & Rehabilitation

## 2016-07-08 ENCOUNTER — Encounter: Payer: Worker's Compensation | Attending: Registered Nurse | Admitting: Physical Medicine & Rehabilitation

## 2016-07-08 DIAGNOSIS — S14145S Brown-Sequard syndrome at C5 level of cervical spinal cord, sequela: Secondary | ICD-10-CM | POA: Diagnosis not present

## 2016-07-08 DIAGNOSIS — F419 Anxiety disorder, unspecified: Secondary | ICD-10-CM | POA: Insufficient documentation

## 2016-07-08 DIAGNOSIS — F329 Major depressive disorder, single episode, unspecified: Secondary | ICD-10-CM | POA: Diagnosis not present

## 2016-07-08 DIAGNOSIS — F1290 Cannabis use, unspecified, uncomplicated: Secondary | ICD-10-CM | POA: Diagnosis not present

## 2016-07-08 DIAGNOSIS — G8111 Spastic hemiplegia affecting right dominant side: Secondary | ICD-10-CM | POA: Insufficient documentation

## 2016-07-08 DIAGNOSIS — R1310 Dysphagia, unspecified: Secondary | ICD-10-CM | POA: Insufficient documentation

## 2016-07-08 DIAGNOSIS — Z79899 Other long term (current) drug therapy: Secondary | ICD-10-CM

## 2016-07-08 DIAGNOSIS — G894 Chronic pain syndrome: Secondary | ICD-10-CM

## 2016-07-08 DIAGNOSIS — Z5181 Encounter for therapeutic drug level monitoring: Secondary | ICD-10-CM

## 2016-07-08 MED ORDER — OXYCODONE-ACETAMINOPHEN 10-325 MG PO TABS: 1.0000 | ORAL_TABLET | Freq: Three times a day (TID) | ORAL | 0 refills | Status: DC | PRN

## 2016-07-08 MED ORDER — METHADONE HCL 10 MG PO TABS: ORAL_TABLET | ORAL | 0 refills | Status: DC

## 2016-07-08 NOTE — Progress Notes (Signed)
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 This includes bleeding, bruising, infection, excessive weakness, or medication side effects. A REMS form is on file and signed.  Needle: 50mm injectable monopolar needle electrode  Number of units per muscle Pectoralis Major 0 units Pectoralis Minor 0 units Biceps 0 units Brachioradialis 0 units FCR 25 units FCU 25 units FDS 100 units FDP 100 units FPL 0 units Pronator Teres 0 units Pronator Quadratus 0 units Lumbricals 75 units  All injections were done after obtaining appropriate EMG activity and after negative drawback for blood. The patient tolerated the procedure well. Post procedure instructions were given. Return in about 1 month (around 08/08/2016).   Pt with worsening dysphagia for solids. Have ordered a barium swallow to assess.  Also refilled percocet and methadone.

## 2016-07-08 NOTE — Patient Instructions (Signed)
PLEASE CALL ME WITH ANY PROBLEMS OR QUESTIONS ([phone])   HAPPY HOLIDAYS!!!!                    *                * *             *   *   *         *  *   *  *  *     *  *  *  *  *  *  * *  *  *  *  *  *  *  *  *  * *               *  *               *  *               *  *  

## 2016-07-15 DIAGNOSIS — F332 Major depressive disorder, recurrent severe without psychotic features: Secondary | ICD-10-CM | POA: Diagnosis not present

## 2016-07-17 ENCOUNTER — Other Ambulatory Visit: Payer: Self-pay | Admitting: Physical Medicine & Rehabilitation

## 2016-07-21 ENCOUNTER — Ambulatory Visit
Admission: RE | Admit: 2016-07-21 | Discharge: 2016-07-21 | Disposition: A | Discharge status: Home / Self Care | Payer: Medicare HMO | Source: Ambulatory Visit | Attending: Physical Medicine & Rehabilitation | Admitting: Physical Medicine & Rehabilitation

## 2016-07-21 DIAGNOSIS — R1310 Dysphagia, unspecified: Secondary | ICD-10-CM | POA: Diagnosis not present

## 2016-07-27 ENCOUNTER — Telehealth: Payer: Self-pay | Admitting: Physical Medicine & Rehabilitation

## 2016-07-27 NOTE — Telephone Encounter (Signed)
Patient has been notified and was very thankful and appreciative for the call. Patient states he does not want further testing.

## 2016-07-27 NOTE — Telephone Encounter (Signed)
Let Billy NovemberMike know that barium swallow is unremarkable. There are no obstructions or obvious structural abnormalities which are impacting passage of food/liquids. For a more dynamic evaluation, he would need a modified barium swallow or FEES.  If he would like to proceed with further testing, he needs to let us know. It would not be likely at this point for his swallowing to "regress". He doesn't have a degenerative disease, he hasn't had radiation, etc.

## 2016-08-03 ENCOUNTER — Ambulatory Visit: Payer: Self-pay | Admitting: Physical Medicine & Rehabilitation

## 2016-08-05 ENCOUNTER — Encounter: Payer: Worker's Compensation | Attending: Registered Nurse | Admitting: Physical Medicine & Rehabilitation

## 2016-08-05 ENCOUNTER — Encounter: Payer: Self-pay | Admitting: Physical Medicine & Rehabilitation

## 2016-08-05 VITALS — BP 107/74 | HR 95 | Resp 14

## 2016-08-05 DIAGNOSIS — F1290 Cannabis use, unspecified, uncomplicated: Secondary | ICD-10-CM | POA: Diagnosis not present

## 2016-08-05 DIAGNOSIS — G8111 Spastic hemiplegia affecting right dominant side: Secondary | ICD-10-CM

## 2016-08-05 DIAGNOSIS — F419 Anxiety disorder, unspecified: Secondary | ICD-10-CM | POA: Insufficient documentation

## 2016-08-05 DIAGNOSIS — Z5181 Encounter for therapeutic drug level monitoring: Secondary | ICD-10-CM

## 2016-08-05 DIAGNOSIS — Z79899 Other long term (current) drug therapy: Secondary | ICD-10-CM

## 2016-08-05 DIAGNOSIS — F329 Major depressive disorder, single episode, unspecified: Secondary | ICD-10-CM | POA: Insufficient documentation

## 2016-08-05 DIAGNOSIS — S14145S Brown-Sequard syndrome at C5 level of cervical spinal cord, sequela: Secondary | ICD-10-CM

## 2016-08-05 DIAGNOSIS — G894 Chronic pain syndrome: Secondary | ICD-10-CM | POA: Diagnosis not present

## 2016-08-05 MED ORDER — OXYCODONE-ACETAMINOPHEN 10-325 MG PO TABS: 1.0000 | ORAL_TABLET | Freq: Three times a day (TID) | ORAL | 0 refills | Status: DC | PRN

## 2016-08-05 NOTE — Patient Instructions (Signed)
Stretch fingers more at night (use a splint/device which provides more extension       PLEASE FEEL FREE TO CALL OUR OFFICE WITH ANY PROBLEMS OR QUESTIONS [phone])

## 2016-08-05 NOTE — Progress Notes (Signed)
Subjective:    Patient ID: Billy Kelley, male    DOB: 07-Oct-1975, 41 y.o.   MRN: [id number]  HPI  Billy Kelley is back regarding his chronic right spastict hemiparesis. He's had improvement with botox again but still has tightness in his wrist. He's stretching the hand daily and wearing a splint of sorts at night. He complains of having fatigue. He's aware that a lot of the medications he's using can cause fatigue. He id down to 5mg  methadone daily and very happy about that. He's using percocet 3x daily for pain otherwise.   Emotionally he states he's doing well. He tries to stay active at home and with his son.   Pain Inventory Average Pain 5 Pain Right Now 6 My pain is constant, sharp, stabbing, tingling and aching  In the last 24 hours, has pain interfered with the following? General activity 4 Relation with others 4 Enjoyment of life 4 What TIME of day is your pain at its worst? morning, night Sleep (in general) Fair  Pain is worse with: walking and standing Pain improves with: medication and injections Relief from Meds: 4  Mobility walk with assistance use a cane how many minutes can you walk? 10 ability to climb steps?  yes do you drive?  yes Do you have any goals in this area?  no  Function disabled: date disabled . I need assistance with the following:  feeding and meal prep Do you have any goals in this area?  no  Neuro/Psych bowel control problems numbness tingling trouble walking spasms depression anxiety  Prior Studies Any changes since last visit?  no  Physicians involved in your care Any changes since last visit?  no   Family History  Problem Relation Age of Onset  . Hypertension Mother   . Hyperlipidemia Mother   . Cancer Mother 55    endometrial   . Heart disease Father     CHF   . Diabetes Neg Hx    Social History   Social History  . Marital status: Single    Spouse name: N/A  . Number of children: 1   . Years of education: N/A    Occupational History  . Disability      [employer]    Social History Main Topics  . Smoking status: Current Every Day Smoker    Packs/day: 0.50    Years: 20.00    Types: Cigarettes  . Smokeless tobacco: Never Used  . Alcohol use No  . Drug use: Yes    Types: Marijuana     Comment: 2-3 times week   . Sexual activity: Yes    Birth control/ protection: Condom   Other Topics Concern  . None   Social History Narrative   Lives with mom.   Moved from [place] most recently. Also live in [place] in [place].          Past Surgical History:  Procedure Laterality Date  . BRAIN SURGERY    . HERNIA REPAIR Bilateral 1976  . spinal cord fusion  2005    C4-C6    Past Medical History:  Diagnosis Date  . Kidney stone 2012   passed with fluids   . 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, [employer] representative, Statistician, shattered C5 and fused C4-C6    BP 107/74   Pulse 95   Resp 14   SpO2 94%   Opioid Risk Score:   Fall Risk Score:  `  1  Depression screen PHQ 2/9  Depression screen [facility] 06/01/2016 04/01/2016 10/23/2015 09/30/2015 03/19/2015 02/04/2015 10/01/2014  Decreased Interest 2 2 2 1 1 1 1   Down, Depressed, Hopeless 2 2 2 1 1 1 1   PHQ - 2 Score 4 4 4 2 2 2 2   Altered sleeping - - 1 - - - 1  Tired, decreased energy - - 3 - - - 2  Change in appetite - - 0 - - - 2  Feeling bad or failure about yourself  - - 1 - - - 2  Trouble concentrating - - 3 - - - 1  Moving slowly or fidgety/restless - - 0 - - - 0  Suicidal thoughts - - 0 - - - 1  PHQ-9 Score - - 12 - - - 11  Difficult doing work/chores - - Very difficult - - - -     Review of Systems  Constitutional: Negative.   HENT: Negative.   Eyes: Negative.   Respiratory: Negative.   Cardiovascular: Negative.   Gastrointestinal:       Bowel control problems  Endocrine: Negative.   Genitourinary: Negative.   Musculoskeletal: Positive for gait problem.       Spasms  Skin: Negative.   Allergic/Immunologic:  Negative.   Neurological: Positive for numbness.       Tingling  Psychiatric/Behavioral: Positive for dysphoric mood. The patient is nervous/anxious.   All other systems reviewed and are negative.      Objective:   Physical Exam  General: Alert and oriented x 3, No apparent distress  HEENT:Head is normocephalic, atraumatic, PERRLA, EOMI, sclera anicteric, oral mucosa pink and moist, dentition intact, ext ear canals clear,  Neck:Supple  Heart:RRR Chest:CTA B Abdomen:NT, ND.  Extremities:skin warm  Skin:Clean and intact.  Neuro:Alert and oriented x3. CN exam intact. 3+ to 4/5 deltoid, bicep. 2-3/5  wrist and elbow extension, HI, 1-2/5 grip.Finger extensors remain very weak.  Finger fexor tone RUE 1/4, ?lumbrical les involved tr/4. LT,PP 1+/2 RUE. LUE nearly 5/5 prox to distal. Normal sensory LUE. RLE: 2 HF, HE, 2- KE and KF, trace ADF/eversion, absent PF. Tone at ankle is 1-2/4. Fluctuating tone knee at tr to 1/4. Pt's LLE: trace to 0/2 sensation. Strength grossly 4 to 4+/5. Gait improved Truncal sensation 1/2 left side. R  Musculoskeletal:lmpoved pelvic symmetry while standing but uses substitution patterns and engages right pevlis and trunk to assist in swinging right leg. Still with diminished right knee bend during gait. W  Right lumbar paraspinals with spasms and tenderness with palpation. Psych:Pt's affect is appropriate   Assessment & Plan:  1. C5 fx/SCI with Billy Kelley Sequard Presentation  2. Spastic right hemiparesis  3. Neurogenic bowel  4. Reactive depression with anxiety, ?PTSD 5. Neuropathic pain due to #1    Plan:  1. Movantik 25mg  daily to continue  2. I refilled Percocet 10/325 for breakthrough pain, q8 prn #90. A second rx was given for next month 3. Dysport 1000 u RUE 4. Methadone: 5mg  daily ---close to being off now.  will not refill 5. Continue with HEP -AFO per [facility] -splinting/towel to help support right HI.  Needs more stretch   6. Continue baclofen for now. klonopin to 0.5mg  q8.  7.continue with smoking cessation 8. For neuropathic pain maintain gabapentin at [phone] daily 15 minutes of face to face patient care time were spent during this visit. All questions were encouraged and answered. .Marland Kitchen

## 2016-08-19 ENCOUNTER — Telehealth: Payer: Self-pay | Admitting: Family Medicine

## 2016-08-19 NOTE — Telephone Encounter (Signed)
Patient called the office to speak with PCP. Pt would like PCP to prescribe him Viagra. Pt didn't want to give more details. Stated that it was personal and that PCP knows who he is and his situation. [address] [facility] [address].  Thank you.

## 2016-08-21 DIAGNOSIS — F332 Major depressive disorder, recurrent severe without psychotic features: Secondary | ICD-10-CM | POA: Diagnosis not present

## 2016-08-25 ENCOUNTER — Ambulatory Visit: Payer: Medicare HMO | Attending: Family Medicine | Admitting: Family Medicine

## 2016-08-25 ENCOUNTER — Encounter: Payer: Self-pay | Admitting: Family Medicine

## 2016-08-25 VITALS — BP 109/74 | HR 94 | Temp 98.2°F | Ht 72.0 in | Wt 166.0 lb

## 2016-08-25 DIAGNOSIS — Z79899 Other long term (current) drug therapy: Secondary | ICD-10-CM | POA: Insufficient documentation

## 2016-08-25 DIAGNOSIS — Z87891 Personal history of nicotine dependence: Secondary | ICD-10-CM | POA: Diagnosis not present

## 2016-08-25 DIAGNOSIS — Z23 Encounter for immunization: Secondary | ICD-10-CM | POA: Diagnosis not present

## 2016-08-25 DIAGNOSIS — N529 Male erectile dysfunction, unspecified: Secondary | ICD-10-CM | POA: Insufficient documentation

## 2016-08-25 MED ORDER — SILDENAFIL CITRATE 100 MG PO TABS: 50.0000 mg | ORAL_TABLET | Freq: Every day | ORAL | 3 refills | Status: DC | PRN

## 2016-08-25 MED ORDER — SILDENAFIL CITRATE 100 MG PO TABS: 50.0000 mg | ORAL_TABLET | Freq: Every day | ORAL | 0 refills | Status: DC | PRN

## 2016-08-25 NOTE — Patient Instructions (Addendum)
Billy Kelley was seen today for erectile dysfunction.  Diagnoses and all orders for this visit:  Erectile dysfunction, unspecified erectile dysfunction type -     Discontinue: sildenafil (VIAGRA) 100 MG tablet; Take 0.5-1 tablets (50-100 mg total) by mouth daily as needed for erectile dysfunction. -     sildenafil (VIAGRA) 100 MG tablet; Take 0.5-1 tablets (50-100 mg total) by mouth daily as needed for erectile dysfunction. For PASS   Take the 5 pill Rx with you and shop around, check [facility], check [facility]. Your pharmacist can help you find the best price.  Drop off the 30 pill Rx at the onsite pharmacy to see if can qualify for medication assitance  F/u as needed  Dr. Armen PickupFunches

## 2016-08-25 NOTE — Progress Notes (Signed)
Subjective:  Patient ID: Billy Kelley, male    DOB: 09-01-1975  Age: [age] y.o. MRN: [id number]  CC: Erectile Dysfunction   HPI Billy Kelley presents for    1. Erectile dysfunction: having trouble keeping and getting a strong erection. This started in 2005 when he had head trauma accident. Denies change in testicle size or hair loss. He has used Viagra in the past with good results. He would like to use it again. He denies HA and dizziness with Viagra.   Past Surgical History:  Procedure Laterality Date  . BRAIN SURGERY    . HERNIA REPAIR Bilateral 1976  . spinal cord fusion  2005    C4-C6     Social History  Substance Use Topics  . Smoking status: Former Smoker    Packs/day: 0.50    Years: 20.00    Types: Cigarettes    Quit date: 06/14/2016  . Smokeless tobacco: Never Used  . Alcohol use No    Outpatient Medications Prior to Visit  Medication Sig Dispense Refill  . amphetamine-dextroamphetamine (ADDERALL) 20 MG tablet   0  . baclofen (LIORESAL) 20 MG tablet TAKE 1 TABLET BY MOUTH FOUR TIMES DAILY 360 tablet 0  . clonazePAM (KLONOPIN) 0.5 MG tablet Take 1 tablet (0.5 mg total) by mouth 3 (three) times daily. 90 tablet 3  . FLUoxetine (PROZAC) 20 MG capsule TAKE 3 CAPSULES(60 MG) BY MOUTH DAILY 270 capsule 1  . gabapentin (NEURONTIN) 600 MG tablet Take 1 tablet (600 mg total) by mouth as directed. 1 tablet twice daily and two tablets at bedtime. 120 tablet 3  . MOVANTIK 25 MG TABS tablet TAKE 1 TABLET BY MOUTH EVERY DAY 30 tablet 2  . oxyCODONE-acetaminophen (PERCOCET) 10-325 MG tablet Take 1 tablet by mouth every 8 (eight) hours as needed for pain. 90 tablet 0  . traZODone (DESYREL) 50 MG tablet Take 1/2- 1  tablet at Bedtime 30 tablet 2  . cetirizine (ZYRTEC) 10 MG tablet Take 1 tablet (10 mg total) by mouth daily. (Patient not taking: Reported on 08/25/2016) 30 tablet 11  . Cholecalciferol (VITAMIN D3) 2000 UNITS TABS Take 2,000 Units by mouth daily. (Patient not  taking: Reported on 08/25/2016) 30 tablet 11  . methadone (DOLOPHINE) 10 MG tablet Take half to one tablet daily. (Patient not taking: Reported on 08/25/2016) 30 tablet 0   No facility-administered medications prior to visit.     ROS Review of Systems  Constitutional: Negative for chills, fatigue, fever and unexpected weight change.  Eyes: Negative for visual disturbance.  Respiratory: Negative for cough and shortness of breath.   Cardiovascular: Negative for chest pain, palpitations and leg swelling.  Gastrointestinal: Negative for abdominal pain, blood in stool, constipation, diarrhea, nausea and vomiting.  Endocrine: Negative for polydipsia, polyphagia and polyuria.  Musculoskeletal: Negative for arthralgias, back pain, gait problem, myalgias and neck pain.  Skin: Negative for rash.  Allergic/Immunologic: Negative for immunocompromised state.  Hematological: Negative for adenopathy. Does not bruise/bleed easily.  Psychiatric/Behavioral: Negative for dysphoric mood, sleep disturbance and suicidal ideas. The patient is not nervous/anxious.     Objective:  BP 109/74 (BP Location: Left Arm, Patient Position: Sitting, Cuff Size: Small)   Pulse 94   Temp 98.2 F (36.8 C) (Oral)   Ht 6' (1.829 m)   Wt 166 lb (75.3 kg)   SpO2 97%   BMI 22.51 kg/m   BP/Weight 08/25/2016 08/05/2016 06/01/2016  Systolic BP 109 107 122  Diastolic BP 74 74 79  Wt. (Lbs)  166 - -  BMI 22.51 - -    Physical Exam  Constitutional: He appears well-developed and well-nourished. No distress.  HENT:  Head: Normocephalic and atraumatic.  Neck: Normal range of motion. Neck supple.  Cardiovascular: Normal rate, regular rhythm, normal heart sounds and intact distal pulses.   Pulses:      Femoral pulses are 2+ on the right side, and 2+ on the left side. Pulmonary/Chest: Effort normal and breath sounds normal.  Abdominal: Hernia confirmed negative in the right inguinal area and confirmed negative in the left  inguinal area.  Genitourinary: Testes normal. Circumcised.  Musculoskeletal: He exhibits no edema.  Lymphadenopathy:       Right: No inguinal adenopathy present.       Left: No inguinal adenopathy present.  Neurological: He is alert.  Skin: Skin is warm and dry. No rash noted. No erythema.  Psychiatric: He has a normal mood and affect.     Assessment & Plan:  [name] was seen today for erectile dysfunction.  Diagnoses and all orders for this visit:  Erectile dysfunction, unspecified erectile dysfunction type -     Discontinue: sildenafil (VIAGRA) 100 MG tablet; Take 0.5-1 tablets (50-100 mg total) by mouth daily as needed for erectile dysfunction. -     sildenafil (VIAGRA) 100 MG tablet; Take 0.5-1 tablets (50-100 mg total) by mouth daily as needed for erectile dysfunction. For PASS   There are no diagnoses linked to this encounter.  No orders of the defined types were placed in this encounter.   Follow-up: No Follow-up on file.   Dessa Phi MD

## 2016-08-25 NOTE — Assessment & Plan Note (Signed)
ED for 13 year following spinal cord injury, no evidence of vascular disease or testosterone deficiency  on exam  Plan: viagra

## 2016-09-09 ENCOUNTER — Other Ambulatory Visit: Payer: Self-pay

## 2016-09-09 DIAGNOSIS — N529 Male erectile dysfunction, unspecified: Secondary | ICD-10-CM

## 2016-09-09 MED ORDER — SILDENAFIL CITRATE 100 MG PO TABS: 50.0000 mg | ORAL_TABLET | Freq: Every day | ORAL | 3 refills | Status: DC | PRN

## 2016-09-09 MED ORDER — FLUOXETINE HCL 20 MG PO CAPS: ORAL_CAPSULE | ORAL | 3 refills | Status: DC

## 2016-09-09 MED ORDER — FLUOXETINE HCL 20 MG PO CAPS: ORAL_CAPSULE | ORAL | 1 refills | Status: DC

## 2016-09-10 ENCOUNTER — Other Ambulatory Visit: Payer: Self-pay | Admitting: Family Medicine

## 2016-09-10 DIAGNOSIS — M25571 Pain in right ankle and joints of right foot: Secondary | ICD-10-CM | POA: Diagnosis not present

## 2016-09-10 MED ORDER — FLUOXETINE HCL 20 MG PO CAPS: ORAL_CAPSULE | ORAL | 3 refills | Status: DC

## 2016-09-11 DIAGNOSIS — S93401A Sprain of unspecified ligament of right ankle, initial encounter: Secondary | ICD-10-CM | POA: Diagnosis not present

## 2016-10-07 ENCOUNTER — Encounter: Payer: Worker's Compensation | Admitting: Physical Medicine & Rehabilitation

## 2016-10-13 ENCOUNTER — Ambulatory Visit: Payer: Self-pay | Admitting: Registered Nurse

## 2016-10-13 ENCOUNTER — Other Ambulatory Visit: Payer: Self-pay | Admitting: Physical Medicine & Rehabilitation

## 2016-10-14 ENCOUNTER — Encounter: Payer: Worker's Compensation | Attending: Registered Nurse | Admitting: Registered Nurse

## 2016-10-14 ENCOUNTER — Telehealth: Payer: Self-pay | Admitting: Registered Nurse

## 2016-10-14 ENCOUNTER — Encounter: Payer: Self-pay | Admitting: Registered Nurse

## 2016-10-14 VITALS — BP 128/79 | HR 83

## 2016-10-14 DIAGNOSIS — F329 Major depressive disorder, single episode, unspecified: Secondary | ICD-10-CM

## 2016-10-14 DIAGNOSIS — K592 Neurogenic bowel, not elsewhere classified: Secondary | ICD-10-CM | POA: Diagnosis not present

## 2016-10-14 DIAGNOSIS — M542 Cervicalgia: Secondary | ICD-10-CM

## 2016-10-14 DIAGNOSIS — M79601 Pain in right arm: Secondary | ICD-10-CM | POA: Diagnosis not present

## 2016-10-14 DIAGNOSIS — Z5181 Encounter for therapeutic drug level monitoring: Secondary | ICD-10-CM | POA: Diagnosis not present

## 2016-10-14 DIAGNOSIS — R531 Weakness: Secondary | ICD-10-CM | POA: Insufficient documentation

## 2016-10-14 DIAGNOSIS — M79641 Pain in right hand: Secondary | ICD-10-CM | POA: Diagnosis not present

## 2016-10-14 DIAGNOSIS — Z87442 Personal history of urinary calculi: Secondary | ICD-10-CM | POA: Insufficient documentation

## 2016-10-14 DIAGNOSIS — R200 Anesthesia of skin: Secondary | ICD-10-CM | POA: Diagnosis not present

## 2016-10-14 DIAGNOSIS — F418 Other specified anxiety disorders: Secondary | ICD-10-CM | POA: Insufficient documentation

## 2016-10-14 DIAGNOSIS — G8111 Spastic hemiplegia affecting right dominant side: Secondary | ICD-10-CM | POA: Diagnosis not present

## 2016-10-14 DIAGNOSIS — F419 Anxiety disorder, unspecified: Secondary | ICD-10-CM

## 2016-10-14 DIAGNOSIS — G8929 Other chronic pain: Secondary | ICD-10-CM | POA: Diagnosis present

## 2016-10-14 DIAGNOSIS — Z79899 Other long term (current) drug therapy: Secondary | ICD-10-CM

## 2016-10-14 DIAGNOSIS — F4310 Post-traumatic stress disorder, unspecified: Secondary | ICD-10-CM | POA: Insufficient documentation

## 2016-10-14 DIAGNOSIS — G4700 Insomnia, unspecified: Secondary | ICD-10-CM | POA: Diagnosis not present

## 2016-10-14 DIAGNOSIS — Z760 Encounter for issue of repeat prescription: Secondary | ICD-10-CM | POA: Insufficient documentation

## 2016-10-14 DIAGNOSIS — S14145S Brown-Sequard syndrome at C5 level of cervical spinal cord, sequela: Secondary | ICD-10-CM | POA: Diagnosis not present

## 2016-10-14 DIAGNOSIS — G894 Chronic pain syndrome: Secondary | ICD-10-CM

## 2016-10-14 DIAGNOSIS — Z87891 Personal history of nicotine dependence: Secondary | ICD-10-CM | POA: Insufficient documentation

## 2016-10-14 MED ORDER — OXYCODONE-ACETAMINOPHEN 10-325 MG PO TABS: 1.0000 | ORAL_TABLET | Freq: Three times a day (TID) | ORAL | 0 refills | Status: DC | PRN

## 2016-10-14 NOTE — Telephone Encounter (Signed)
On 10/14/2016 the  NCCSR was reviewed no conflict was seen on the [place] Controlled Substance Reporting System with multiple prescribers.  If there were any discrepancies this would have been reported to his physician.   

## 2016-10-14 NOTE — Progress Notes (Signed)
Subjective:    Patient ID: Billy Kelley, male    DOB: 05-Dec-1975, 41 y.o.   MRN: [id number]  HPI: [name] is a 41 year old male who returns for follow up for chronic pain and medication refill. He states his pain is located in his neck, right hand ,right arm and lower back. He rates his pain 7. His current exercise regime is walking, fishing and performing stretching exercises.  Scheduled Botox next month with Dr. Riley Kill.   Pain Inventory Average Pain 7 Pain Right Now 7 My pain is constant, sharp, burning, dull, stabbing and aching  In the last 24 hours, has pain interfered with the following? General activity 3 Relation with others 3 Enjoyment of life 4 What TIME of day is your pain at its worst? morning and night Sleep (in general) Poor  Pain is worse with: walking and standing Pain improves with: medication Relief from Meds: 5  Mobility use a cane ability to climb steps?  yes do you drive?  yes  Function disabled: date disabled .  Neuro/Psych bowel control problems weakness numbness tremor tingling trouble walking spasms depression anxiety  Prior Studies Any changes since last visit?  no  Physicians involved in your care Any changes since last visit?  no   Family History  Problem Relation Age of Onset  . Hypertension Mother   . Hyperlipidemia Mother   . Cancer Mother 91    endometrial   . Heart disease Father     CHF   . Diabetes Neg Hx    Social History   Social History  . Marital status: Single    Spouse name: N/A  . Number of children: 1   . Years of education: N/A   Occupational History  . Disability      [employer]    Social History Main Topics  . Smoking status: Former Smoker    Packs/day: 0.50    Years: 20.00    Types: Cigarettes    Quit date: 06/14/2016  . Smokeless tobacco: Never Used  . Alcohol use No  . Drug use: Yes    Types: Marijuana     Comment: 2-3 times week   . Sexual activity: Yes    Birth control/  protection: Condom   Other Topics Concern  . None   Social History Narrative   Lives with mom.   Moved from [place] most recently. Also live in [place] in [place].          Past Surgical History:  Procedure Laterality Date  . BRAIN SURGERY    . HERNIA REPAIR Bilateral 1976  . spinal cord fusion  2005    C4-C6    Past Medical History:  Diagnosis Date  . Kidney stone 2012   passed with fluids   . 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, [employer] representative, Statistician, shattered C5 and fused C4-C6    BP 128/79   Pulse 83   SpO2 97%   Opioid Risk Score:   Fall Risk Score:  `1  Depression screen PHQ 2/9  Depression screen [facility] 2/9 08/26/2016 06/01/2016 04/01/2016 10/23/2015 09/30/2015 03/19/2015 02/04/2015  Decreased Interest Down, Depressed, Hopeless 0 PHQ - 2 Score Altered sleeping 0 - - 1 - - -  Tired, decreased energy 0 - - 3 - - -  Change in appetite 0 - - 0 - - -  Feeling bad or failure about yourself  0 - - 1 - - -  Trouble concentrating 0 - - 3 - - -  Moving slowly or fidgety/restless 0 - - 0 - - -  Suicidal thoughts 0 - - 0 - - -  PHQ-9 Score 2 - - 12 - - -  Difficult doing work/chores - - - Very difficult - - -    Review of Systems  Constitutional: Negative.   HENT: Negative.   Eyes: Negative.   Respiratory: Negative.   Cardiovascular: Negative.   Gastrointestinal: Negative.   Endocrine: Negative.   Genitourinary: Negative.   Musculoskeletal: Negative.   Skin: Negative.   Allergic/Immunologic: Negative.   Neurological: Negative.   Hematological: Negative.   Psychiatric/Behavioral: Negative.   All other systems reviewed and are negative.      Objective:   Physical Exam  Constitutional: He is oriented to person, place, and time. He appears well-developed and well-nourished.  HENT:  Head: Normocephalic and atraumatic.  Neck: Normal range of motion. Neck supple.  Cervical Paraspinal  Tenderness: C-5-C-6 Decreased ROM with Cervical Flexion/ Extension. And Right and Left Lateral Flexion 30 Degrees  Cardiovascular: Normal rate and regular rhythm.   Pulmonary/Chest: Effort normal and breath sounds normal.  Musculoskeletal:  Normal Muscle Bulk and Muscle Testing Reveals: Upper Extremities: Full ROM and Muscle Strength Right 2/5 and Left5/5 Lumbar Paraspinal Tenderness: L-3-L-5 Lower Extremities: Right: Decreased ROM and Muscle Strength 4/5 Left: Full ROM and Muscle Strength 5/5 Arises from Table slowly  Antalgic Gait   Neurological: He is alert and oriented to person, place, and time.  Skin: Skin is warm and dry.  Psychiatric: He has a normal mood and affect.  Nursing note and vitals reviewed.         Assessment & Plan:  1. C5 fx/SCI with [name] Presentation: 10/14/2016 Refilled:  Oxycodone 10/325 mg one tablet every 8 hours as needed #90. Second script given to accommodate scheduled appointment. We will continue the opioid monitoring program, this consists of regular clinic visits, examinations, urine drug screen, pill counts as well as use of [place] Controlled Substance reporting System Continue Gabapentin for nerve Pain 2. Spastic right hemiparesis: Continue Baclofen and Klonopin as prescribed. 10/14/2016 3. Neurogenic bowel: Continue with Bowel Regimen. Continue Movantik. 10/14/2016 4. Reactive depression with anxiety, ?PTSD. Continue Adderall and Prozac. Psychiatry Following: [name] at the [facility]. 10/14/2016 5. Insomnia: Continue Trazodone. 10/14/2016 6. Cervicalgia: Refuses X-ray at this time. Alternate Heat/ Ice Therapy Continue to monitor.  30 minutes of face to face patient care time was spent during this visit. All questions were encouraged and answered.    F/U in 1 month

## 2016-10-19 ENCOUNTER — Telehealth: Payer: Self-pay | Admitting: *Deleted

## 2016-10-19 LAB — TOXASSURE SELECT,+ANTIDEPR,UR

## 2016-10-19 NOTE — Telephone Encounter (Signed)
I spoke with Billy Kelley about his 2 positive UDS for marijuana. (4/18 and 11/17) We are not going to prescribe narcotics any longer. At first he explained his trip to [place] and his sister having something like his vape system and he took a hit off it and did not know it was marijuana. He is not marijuana naive so I did not take this as an accident.  Then I questioned about the November test and he started telling about his nephews.  I suggested he avoid his family if this is causing an issue. He agreed but is in agreement to wean down over the next two months off his percocet. He was given two months prescriptions and will start cutting back. He said he knew it was time for UDS and was expecting to be called. He said he had figured out when to expect UDS. He says he is actually glad this has happened since he will be free use marijuana.  He will see Dr Riley Kill in May for BOTOX treatment.

## 2016-11-03 ENCOUNTER — Other Ambulatory Visit: Payer: Self-pay | Admitting: Physical Medicine & Rehabilitation

## 2016-11-03 DIAGNOSIS — Z5181 Encounter for therapeutic drug level monitoring: Secondary | ICD-10-CM

## 2016-11-03 DIAGNOSIS — Z79899 Other long term (current) drug therapy: Secondary | ICD-10-CM

## 2016-11-03 DIAGNOSIS — S14145S Brown-Sequard syndrome at C5 level of cervical spinal cord, sequela: Secondary | ICD-10-CM

## 2016-11-03 DIAGNOSIS — G8111 Spastic hemiplegia affecting right dominant side: Secondary | ICD-10-CM

## 2016-11-03 DIAGNOSIS — G894 Chronic pain syndrome: Secondary | ICD-10-CM

## 2016-11-11 DIAGNOSIS — F332 Major depressive disorder, recurrent severe without psychotic features: Secondary | ICD-10-CM | POA: Diagnosis not present

## 2016-11-25 ENCOUNTER — Encounter: Payer: Worker's Compensation | Attending: Registered Nurse | Admitting: Physical Medicine & Rehabilitation

## 2016-11-25 ENCOUNTER — Encounter: Payer: Self-pay | Admitting: Physical Medicine & Rehabilitation

## 2016-11-25 ENCOUNTER — Encounter: Payer: Self-pay | Admitting: Family Medicine

## 2016-11-25 ENCOUNTER — Encounter: Payer: Worker's Compensation | Admitting: Physical Medicine & Rehabilitation

## 2016-11-25 DIAGNOSIS — R531 Weakness: Secondary | ICD-10-CM | POA: Diagnosis not present

## 2016-11-25 DIAGNOSIS — Z760 Encounter for issue of repeat prescription: Secondary | ICD-10-CM | POA: Diagnosis present

## 2016-11-25 DIAGNOSIS — F4310 Post-traumatic stress disorder, unspecified: Secondary | ICD-10-CM | POA: Insufficient documentation

## 2016-11-25 DIAGNOSIS — M542 Cervicalgia: Secondary | ICD-10-CM | POA: Insufficient documentation

## 2016-11-25 DIAGNOSIS — Z87442 Personal history of urinary calculi: Secondary | ICD-10-CM | POA: Diagnosis not present

## 2016-11-25 DIAGNOSIS — K592 Neurogenic bowel, not elsewhere classified: Secondary | ICD-10-CM | POA: Diagnosis not present

## 2016-11-25 DIAGNOSIS — Z87891 Personal history of nicotine dependence: Secondary | ICD-10-CM | POA: Insufficient documentation

## 2016-11-25 DIAGNOSIS — G8111 Spastic hemiplegia affecting right dominant side: Secondary | ICD-10-CM

## 2016-11-25 DIAGNOSIS — R200 Anesthesia of skin: Secondary | ICD-10-CM | POA: Insufficient documentation

## 2016-11-25 DIAGNOSIS — F418 Other specified anxiety disorders: Secondary | ICD-10-CM | POA: Diagnosis not present

## 2016-11-25 DIAGNOSIS — G8929 Other chronic pain: Secondary | ICD-10-CM | POA: Diagnosis not present

## 2016-11-25 DIAGNOSIS — Z5181 Encounter for therapeutic drug level monitoring: Secondary | ICD-10-CM

## 2016-11-25 DIAGNOSIS — M79601 Pain in right arm: Secondary | ICD-10-CM | POA: Diagnosis not present

## 2016-11-25 DIAGNOSIS — Z79899 Other long term (current) drug therapy: Secondary | ICD-10-CM

## 2016-11-25 DIAGNOSIS — X58XXXS Exposure to other specified factors, sequela: Secondary | ICD-10-CM | POA: Insufficient documentation

## 2016-11-25 DIAGNOSIS — G894 Chronic pain syndrome: Secondary | ICD-10-CM | POA: Diagnosis not present

## 2016-11-25 DIAGNOSIS — M79641 Pain in right hand: Secondary | ICD-10-CM | POA: Diagnosis not present

## 2016-11-25 DIAGNOSIS — S14145S Brown-Sequard syndrome at C5 level of cervical spinal cord, sequela: Secondary | ICD-10-CM | POA: Diagnosis not present

## 2016-11-25 DIAGNOSIS — G4700 Insomnia, unspecified: Secondary | ICD-10-CM | POA: Insufficient documentation

## 2016-11-25 MED ORDER — GABAPENTIN 600 MG PO TABS: 600.0000 mg | ORAL_TABLET | Freq: Every day | ORAL | 2 refills | Status: DC

## 2016-11-25 NOTE — Progress Notes (Signed)
Subjective:    Patient ID: Billy Kelley, male    DOB: 01/30/1976, 41 y.o.   MRN: [id number]  HPI  Billy Kelley is here in follow up of his spastic right hemiparesis. He tested + for THC last month and he's non-narcotic. He's weaning off the oxycodone now and really feels better for it. He is getting increased tone in the right hand. He was due for botulinum toxin injections but WC did not approve so it's currently on hold. He is trying stretching on his own  He uses his gabapentin during the day and before bedtime. The night doses seem to help him sleep and reduce his morning pain.     Pain Inventory Average Pain 3 Pain Right Now 3 My pain is constant, sharp, dull and aching  In the last 24 hours, has pain interfered with the following? General activity 3 Relation with others 3 Enjoyment of life 3 What TIME of day is your pain at its worst? night Sleep (in general) Fair  Pain is worse with: walking, bending and standing Pain improves with: medication Relief from Meds: 2  Mobility Do you have any goals in this area?  no  Function disabled: date disabled .  Neuro/Psych numbness tremor trouble walking spasms depression anxiety  Prior Studies Any changes since last visit?  no  Physicians involved in your care Any changes since last visit?  no   Family History  Problem Relation Age of Onset  . Hypertension Mother   . Hyperlipidemia Mother   . Cancer Mother 64       endometrial   . Heart disease Father        CHF   . Diabetes Neg Hx    Social History   Social History  . Marital status: Single    Spouse name: N/A  . Number of children: 1   . Years of education: N/A   Occupational History  . Disability      [employer]    Social History Main Topics  . Smoking status: Former Smoker    Packs/day: 0.50    Years: 20.00    Types: Cigarettes    Quit date: 06/14/2016  . Smokeless tobacco: Never Used  . Alcohol use No  . Drug use: Yes    Types: Marijuana   Comment: 2-3 times week   . Sexual activity: Yes    Birth control/ protection: Condom   Other Topics Concern  . Not on file   Social History Narrative   Lives with mom.   Moved from [place] most recently. Also live in [place] in [place].          Past Surgical History:  Procedure Laterality Date  . BRAIN SURGERY    . HERNIA REPAIR Bilateral 1976  . spinal cord fusion  2005    C4-C6    Past Medical History:  Diagnosis Date  . Kidney stone 2012   passed with fluids   . 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  Opioid Risk Score:   Fall Risk Score:  `1  Depression screen PHQ 2/9  Depression screen [facility] 2/9 08/26/2016 06/01/2016 04/01/2016 10/23/2015 09/30/2015 03/19/2015 02/04/2015  Decreased Interest 2 2 2 2 1 1 1   Down, Depressed, Hopeless 0 2 2 2 1 1 1   PHQ - 2 Score 2 4 4  4  2 2 2   Altered sleeping 0 - - 1 - - -  Tired, decreased energy 0 - - 3 - - -  Change in appetite 0 - - 0 - - -  Feeling bad or failure about yourself  0 - - 1 - - -  Trouble concentrating 0 - - 3 - - -  Moving slowly or fidgety/restless 0 - - 0 - - -  Suicidal thoughts 0 - - 0 - - -  PHQ-9 Score 2 - - 12 - - -  Difficult doing work/chores - - - Very difficult - - -    Review of Systems  Constitutional: Negative.   HENT: Negative.   Eyes: Negative.   Respiratory: Negative.   Cardiovascular: Negative.   Gastrointestinal: Negative.   Endocrine: Negative.   Genitourinary: Negative.   Musculoskeletal: Negative.   Skin: Negative.   Allergic/Immunologic: Negative.   Neurological: Negative.   Hematological: Negative.   Psychiatric/Behavioral: Negative.   All other systems reviewed and are negative.      Objective:   Physical Exam  General: Alert and oriented x 3, No apparent distress  HEENT:Head is normocephalic, atraumatic, PERRLA, EOMI, sclera anicteric, oral  mucosa pink and moist, dentition intact, ext ear canals clear,  Neck:Supple Heart:RRR Chest:CTA B Abdomen:NT, ND.  Extremities:skin warm Skin:Clean and intact. Neuro:Alert and oriented x3. CN exam intact. 3+ to 4/5 deltoid, bicep. 2-3/5  wrist and elbow extension, HI, 1-2/5 grip.Finger extensors remain very weak.  Finger fexor tone RUE 2/4,   Tr-1/4. LT,PP 1+/2 RUE. LUE nearly 5/5 prox to distal. Normal sensory LUE. RLE: 2 HF, HE, 2- KE and KF, trace ADF/eversion, absent PF. Tone at ankle is 1-2/4. Fluctuating tone knee at tr to 1/4. Pt's LLE: trace to 0/2 sensation. Strength grossly 4 to 4+/5.   Musculoskeletal:still circumducts right leg during gait. Has some difficult with heel strike  Right lumbar paraspinals with spasms and tenderness with palpation. Psych:Pt's affect is appropriate   Assessment & Plan:  1. C5 fx/SCI with Billy Kelley Sequard Presentation  2. Spastic right hemiparesis  3. Neurogenic bowel  4. Reactive depression with anxiety, ?PTSD 5. Neuropathic pain due to #1    Plan:  1. Home remedies for bowels 2. He is non-narcotic. He is weaning off the oxycodone. I am not writing 3. Dysport 1000 u RUE. WC pending  4. Can increase gabapentin to 600mg  BID and 1800mg  qhs 5. Continue with HEP -[facility] -splinting as possible  6. Continue baclofen for now 20mg  QID. klonopin to 0.5mg  q8.  7.continue with smoking cessation.    15 minutes of face to face patient care time were spent during this visit. All questions were encouraged and answered. .Marland Kitchen

## 2016-11-25 NOTE — Patient Instructions (Signed)
PLEASE FEEL FREE TO CALL OUR OFFICE WITH ANY PROBLEMS OR QUESTIONS ([phone])      

## 2016-12-03 ENCOUNTER — Other Ambulatory Visit: Payer: Self-pay | Admitting: Family Medicine

## 2016-12-03 DIAGNOSIS — N529 Male erectile dysfunction, unspecified: Secondary | ICD-10-CM

## 2016-12-10 ENCOUNTER — Other Ambulatory Visit: Payer: Self-pay

## 2016-12-10 DIAGNOSIS — N529 Male erectile dysfunction, unspecified: Secondary | ICD-10-CM

## 2016-12-10 MED ORDER — SILDENAFIL CITRATE 100 MG PO TABS: ORAL_TABLET | ORAL | 3 refills | Status: DC

## 2016-12-22 ENCOUNTER — Other Ambulatory Visit: Payer: Self-pay | Admitting: Family Medicine

## 2016-12-22 DIAGNOSIS — N529 Male erectile dysfunction, unspecified: Secondary | ICD-10-CM

## 2016-12-23 ENCOUNTER — Telehealth: Payer: Self-pay | Admitting: Physical Medicine & Rehabilitation

## 2016-12-23 NOTE — Telephone Encounter (Signed)
Phoned home left message that work comp did not approve botox last month need insurance or will be self pay for procedure

## 2016-12-30 ENCOUNTER — Encounter
Payer: Worker's Compensation | Attending: Physical Medicine & Rehabilitation | Admitting: Physical Medicine & Rehabilitation

## 2017-01-05 ENCOUNTER — Telehealth: Payer: Self-pay | Admitting: Physical Medicine & Rehabilitation

## 2017-01-05 NOTE — Telephone Encounter (Signed)
Talked to Billy Kelley and confirmed when I called for authroization on may 15 work comp case worker denied botox injection.  He can have them contact us and approve but he is self pay or ins until it is auth'ed.

## 2017-01-07 ENCOUNTER — Telehealth: Payer: Self-pay | Admitting: Physical Medicine & Rehabilitation

## 2017-01-07 NOTE — Telephone Encounter (Signed)
Phoned number on file [phone] Ms. Billy Kelley refuses to prior auth - per Billy BuggyScott Hergert Ms. Billy Kelley [phone] fax [phone] states it is approved - left 2 message for ms. Kelley to return call.   Ms. Billy Kelley will not Billy Kelley because not original body part - she states Ms. Billy Kelley (clinical will have to approve even thought I have original letter from 2016 showing letter of acceptance for botox to wrist - waiting on return call to auth

## 2017-01-14 DIAGNOSIS — F332 Major depressive disorder, recurrent severe without psychotic features: Secondary | ICD-10-CM | POA: Diagnosis not present

## 2017-01-20 ENCOUNTER — Telehealth: Payer: Self-pay | Admitting: Physical Medicine & Rehabilitation

## 2017-01-20 NOTE — Telephone Encounter (Signed)
Faxed form type [employer] case worker eileen mcmahon requested and a copy of letter from 2016 approving botox - called Billy Kelley and advised the fax was sent

## 2017-02-19 DIAGNOSIS — S299XXA Unspecified injury of thorax, initial encounter: Secondary | ICD-10-CM | POA: Diagnosis not present

## 2017-02-19 DIAGNOSIS — S279XXA Injury of unspecified intrathoracic organ, initial encounter: Secondary | ICD-10-CM | POA: Diagnosis not present

## 2017-02-20 ENCOUNTER — Other Ambulatory Visit: Payer: Self-pay | Admitting: Physical Medicine & Rehabilitation

## 2017-02-23 ENCOUNTER — Telehealth: Payer: Self-pay | Admitting: Physical Medicine & Rehabilitation

## 2017-02-23 NOTE — Telephone Encounter (Signed)
[employer] APPROVED FOR EXCEPTION TO RECEIVE BOTOX [place] - FORM ATTACHED AND IN FOLDER

## 2017-03-10 ENCOUNTER — Encounter
Payer: Worker's Compensation | Attending: Physical Medicine & Rehabilitation | Admitting: Physical Medicine & Rehabilitation

## 2017-03-22 ENCOUNTER — Other Ambulatory Visit: Payer: Self-pay | Admitting: Physical Medicine & Rehabilitation

## 2017-03-23 NOTE — Telephone Encounter (Signed)
Are we still filling patient's anxiety medication (klonopin), in lieu of his THC use. Please advise

## 2017-03-29 ENCOUNTER — Telehealth: Payer: Self-pay | Admitting: *Deleted

## 2017-03-29 MED ORDER — CLONAZEPAM 0.5 MG PO TABS: 0.5000 mg | ORAL_TABLET | Freq: Three times a day (TID) | ORAL | 0 refills | Status: DC | PRN

## 2017-03-29 NOTE — Telephone Encounter (Signed)
Ok'd by Dr Riley Kill.  One month clled to [facility] in [place] where he is staying this week.

## 2017-03-29 NOTE — Telephone Encounter (Signed)
Are we still filling patient's anxiety medication (klonopin), in lieu of his THC use. Please advise 

## 2017-04-01 ENCOUNTER — Other Ambulatory Visit: Payer: Self-pay

## 2017-04-14 ENCOUNTER — Encounter
Payer: Worker's Compensation | Attending: Physical Medicine & Rehabilitation | Admitting: Physical Medicine & Rehabilitation

## 2017-04-14 ENCOUNTER — Encounter: Payer: Self-pay | Admitting: Physical Medicine & Rehabilitation

## 2017-04-14 VITALS — BP 109/70 | HR 95 | Resp 14

## 2017-04-14 DIAGNOSIS — S14145S Brown-Sequard syndrome at C5 level of cervical spinal cord, sequela: Secondary | ICD-10-CM | POA: Diagnosis not present

## 2017-04-14 DIAGNOSIS — G8381 Brown-Sequard syndrome: Secondary | ICD-10-CM | POA: Insufficient documentation

## 2017-04-14 DIAGNOSIS — G8111 Spastic hemiplegia affecting right dominant side: Secondary | ICD-10-CM

## 2017-04-14 MED ORDER — CLONAZEPAM 0.5 MG PO TABS: 0.5000 mg | ORAL_TABLET | Freq: Three times a day (TID) | ORAL | 3 refills | Status: DC | PRN

## 2017-04-14 MED ORDER — CLONAZEPAM 0.5 MG PO TABS: 0.5000 mg | ORAL_TABLET | Freq: Three times a day (TID) | ORAL | 0 refills | Status: DC | PRN

## 2017-04-14 NOTE — Patient Instructions (Signed)
PLEASE FEEL FREE TO CALL OUR OFFICE WITH ANY PROBLEMS OR QUESTIONS ([phone])      

## 2017-04-14 NOTE — Progress Notes (Signed)
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 This includes bleeding, bruising, infection, excessive weakness, or medication side effects. A REMS form is on file and signed.  Needle: 50mm injectable monopolar needle electrode  Number of units per muscle Pectoralis Major 0 units Pectoralis Minor 0 units Biceps 0 units Brachioradialis 0 units FCR 25 units FCU 25 units FDS 350 units FDP 350 units FPL 50 units Pronator Teres 100 units Pronator Quadratus 0 units Lumbricals x4 25u (100u total)  All injections were done after obtaining appropriate EMG activity and after negative drawback for blood. The patient tolerated the procedure well. Post procedure instructions were given. Return in about 3 months (around 07/15/2017).   Additionally wrote refill rx for klonopin and for a resting RUE WHO.

## 2017-04-15 ENCOUNTER — Other Ambulatory Visit: Payer: Self-pay | Admitting: Physical Medicine & Rehabilitation

## 2017-04-15 DIAGNOSIS — F332 Major depressive disorder, recurrent severe without psychotic features: Secondary | ICD-10-CM | POA: Diagnosis not present

## 2017-04-15 DIAGNOSIS — Z5181 Encounter for therapeutic drug level monitoring: Secondary | ICD-10-CM

## 2017-04-15 DIAGNOSIS — G894 Chronic pain syndrome: Secondary | ICD-10-CM

## 2017-04-15 DIAGNOSIS — S14145S Brown-Sequard syndrome at C5 level of cervical spinal cord, sequela: Secondary | ICD-10-CM

## 2017-04-15 DIAGNOSIS — G8111 Spastic hemiplegia affecting right dominant side: Secondary | ICD-10-CM

## 2017-04-15 DIAGNOSIS — Z79899 Other long term (current) drug therapy: Secondary | ICD-10-CM

## 2017-04-15 NOTE — Telephone Encounter (Signed)
Recieved medication refill request for gabapentin , 1 in AM and 2 QHS, is this correct because in note in July states to increase dosage to 1800qhs?  Please review and advise

## 2017-04-19 ENCOUNTER — Other Ambulatory Visit: Payer: Self-pay

## 2017-04-19 DIAGNOSIS — G894 Chronic pain syndrome: Secondary | ICD-10-CM

## 2017-04-19 DIAGNOSIS — G8111 Spastic hemiplegia affecting right dominant side: Secondary | ICD-10-CM

## 2017-04-19 DIAGNOSIS — Z79899 Other long term (current) drug therapy: Secondary | ICD-10-CM

## 2017-04-19 DIAGNOSIS — Z5181 Encounter for therapeutic drug level monitoring: Secondary | ICD-10-CM

## 2017-04-19 DIAGNOSIS — S14145S Brown-Sequard syndrome at C5 level of cervical spinal cord, sequela: Secondary | ICD-10-CM

## 2017-04-19 MED ORDER — GABAPENTIN 600 MG PO TABS: 600.0000 mg | ORAL_TABLET | Freq: Every day | ORAL | 2 refills | Status: DC

## 2017-05-27 ENCOUNTER — Other Ambulatory Visit: Payer: Self-pay

## 2017-05-27 MED ORDER — CLONAZEPAM 0.5 MG PO TABS: 0.5000 mg | ORAL_TABLET | Freq: Three times a day (TID) | ORAL | 1 refills | Status: DC | PRN

## 2017-05-27 NOTE — Telephone Encounter (Signed)
Patient called requesting refill on his clonazepam.

## 2017-05-27 NOTE — Telephone Encounter (Signed)
Reordered and BrandonScott notified.

## 2017-06-09 ENCOUNTER — Other Ambulatory Visit: Payer: Self-pay | Admitting: Physical Medicine & Rehabilitation

## 2017-06-09 DIAGNOSIS — G894 Chronic pain syndrome: Secondary | ICD-10-CM

## 2017-06-09 DIAGNOSIS — S14145S Brown-Sequard syndrome at C5 level of cervical spinal cord, sequela: Secondary | ICD-10-CM

## 2017-06-09 DIAGNOSIS — Z5181 Encounter for therapeutic drug level monitoring: Secondary | ICD-10-CM

## 2017-06-09 DIAGNOSIS — Z79899 Other long term (current) drug therapy: Secondary | ICD-10-CM

## 2017-06-09 DIAGNOSIS — G8111 Spastic hemiplegia affecting right dominant side: Secondary | ICD-10-CM

## 2017-06-10 DIAGNOSIS — J22 Unspecified acute lower respiratory infection: Secondary | ICD-10-CM | POA: Diagnosis not present

## 2017-06-10 DIAGNOSIS — T162XXA Foreign body in left ear, initial encounter: Secondary | ICD-10-CM | POA: Diagnosis not present

## 2017-06-10 DIAGNOSIS — R0602 Shortness of breath: Secondary | ICD-10-CM | POA: Diagnosis not present

## 2017-06-23 DIAGNOSIS — Z13220 Encounter for screening for lipoid disorders: Secondary | ICD-10-CM | POA: Diagnosis not present

## 2017-06-23 DIAGNOSIS — Z0000 Encounter for general adult medical examination without abnormal findings: Secondary | ICD-10-CM | POA: Diagnosis not present

## 2017-06-23 DIAGNOSIS — N529 Male erectile dysfunction, unspecified: Secondary | ICD-10-CM | POA: Diagnosis not present

## 2017-06-23 DIAGNOSIS — Z79899 Other long term (current) drug therapy: Secondary | ICD-10-CM | POA: Diagnosis not present

## 2017-06-28 ENCOUNTER — Other Ambulatory Visit: Payer: Self-pay | Admitting: Physical Medicine & Rehabilitation

## 2017-07-08 DIAGNOSIS — F332 Major depressive disorder, recurrent severe without psychotic features: Secondary | ICD-10-CM | POA: Diagnosis not present

## 2017-07-09 DIAGNOSIS — M549 Dorsalgia, unspecified: Secondary | ICD-10-CM | POA: Diagnosis not present

## 2017-07-09 DIAGNOSIS — N529 Male erectile dysfunction, unspecified: Secondary | ICD-10-CM | POA: Diagnosis not present

## 2017-07-19 ENCOUNTER — Ambulatory Visit: Payer: Self-pay | Admitting: Physical Medicine & Rehabilitation

## 2017-07-20 DIAGNOSIS — F332 Major depressive disorder, recurrent severe without psychotic features: Secondary | ICD-10-CM | POA: Diagnosis not present

## 2017-07-22 ENCOUNTER — Other Ambulatory Visit: Payer: Self-pay | Admitting: Physical Medicine & Rehabilitation

## 2017-07-23 ENCOUNTER — Other Ambulatory Visit: Payer: Self-pay | Admitting: Physical Medicine & Rehabilitation

## 2017-08-02 ENCOUNTER — Other Ambulatory Visit: Payer: Self-pay

## 2017-08-02 ENCOUNTER — Encounter
Payer: Worker's Compensation | Attending: Physical Medicine & Rehabilitation | Admitting: Physical Medicine & Rehabilitation

## 2017-08-02 ENCOUNTER — Encounter: Payer: Self-pay | Admitting: Physical Medicine & Rehabilitation

## 2017-08-02 VITALS — BP 92/64 | HR 97

## 2017-08-02 DIAGNOSIS — G8111 Spastic hemiplegia affecting right dominant side: Secondary | ICD-10-CM | POA: Diagnosis not present

## 2017-08-02 NOTE — Patient Instructions (Signed)
PLEASE FEEL FREE TO CALL OUR OFFICE WITH ANY PROBLEMS OR QUESTIONS ([phone])      

## 2017-08-02 NOTE — Progress Notes (Addendum)
Dysport Injection for spasticity using needle EMG guidance Indication:  Spastic right hemiparesis   Dilution: 500 Units/2.205ml        Total Units Injected:  1000 Indication: Severe spasticity which interferes with ADL,mobility and/or  hygiene and is unresponsive to medication management and other conservative care Informed consent was obtained after describing risks and benefits of the procedure with the patient. This includes bleeding, bruising, infection, excessive weakness, or medication side effects. A REMS form is on file and signed.  Needle: 50mm injectable monopolar needle electrode  Number of units per muscle Pectoralis Major 0 units Pectoralis Minor 0 units Biceps 0 units Brachioradialis 0 units FCR 100 units FCU 100 units FDS 250 units FDP 250 units FPL 0 units Pronator Teres 150 units  Lumbricals (4) 150 units  All injections were done after obtaining appropriate EMG activity and after negative drawback for blood. The patient tolerated the procedure well. Post procedure instructions were given.

## 2017-09-29 ENCOUNTER — Other Ambulatory Visit: Payer: Self-pay | Admitting: Physical Medicine & Rehabilitation

## 2017-10-01 ENCOUNTER — Other Ambulatory Visit: Payer: Self-pay | Admitting: Physical Medicine & Rehabilitation

## 2017-10-01 ENCOUNTER — Telehealth: Payer: Self-pay

## 2017-10-01 MED ORDER — CLONAZEPAM 0.5 MG PO TABS: 0.5000 mg | ORAL_TABLET | Freq: Three times a day (TID) | ORAL | 2 refills | Status: DC | PRN

## 2017-10-01 NOTE — Telephone Encounter (Signed)
I refilled klonopin

## 2017-10-01 NOTE — Telephone Encounter (Signed)
Recieved electronic medication refill request for clonazepam.  No mention in any notes to use or continue to use this medication.  Please advise.

## 2017-10-01 NOTE — Telephone Encounter (Signed)
Sent Dr. Riley KillSwartz message on wither we should continue this medication as there is no mention of it in any noes.

## 2017-10-18 DIAGNOSIS — F332 Major depressive disorder, recurrent severe without psychotic features: Secondary | ICD-10-CM | POA: Diagnosis not present

## 2017-10-22 DIAGNOSIS — F332 Major depressive disorder, recurrent severe without psychotic features: Secondary | ICD-10-CM | POA: Diagnosis not present

## 2017-10-27 ENCOUNTER — Encounter
Payer: Worker's Compensation | Attending: Physical Medicine & Rehabilitation | Admitting: Physical Medicine & Rehabilitation

## 2017-10-27 DIAGNOSIS — G8111 Spastic hemiplegia affecting right dominant side: Secondary | ICD-10-CM | POA: Insufficient documentation

## 2017-11-07 ENCOUNTER — Other Ambulatory Visit: Payer: Self-pay | Admitting: Physical Medicine & Rehabilitation

## 2017-11-07 DIAGNOSIS — Z5181 Encounter for therapeutic drug level monitoring: Secondary | ICD-10-CM

## 2017-11-07 DIAGNOSIS — G894 Chronic pain syndrome: Secondary | ICD-10-CM

## 2017-11-07 DIAGNOSIS — S14145S Brown-Sequard syndrome at C5 level of cervical spinal cord, sequela: Secondary | ICD-10-CM

## 2017-11-07 DIAGNOSIS — Z79899 Other long term (current) drug therapy: Secondary | ICD-10-CM

## 2017-11-07 DIAGNOSIS — G8111 Spastic hemiplegia affecting right dominant side: Secondary | ICD-10-CM

## 2017-11-15 DIAGNOSIS — F411 Generalized anxiety disorder: Secondary | ICD-10-CM | POA: Diagnosis not present

## 2017-11-15 DIAGNOSIS — F329 Major depressive disorder, single episode, unspecified: Secondary | ICD-10-CM | POA: Diagnosis not present

## 2017-11-15 DIAGNOSIS — L03116 Cellulitis of left lower limb: Secondary | ICD-10-CM | POA: Diagnosis not present

## 2017-11-29 DIAGNOSIS — S9031XA Contusion of right foot, initial encounter: Secondary | ICD-10-CM | POA: Diagnosis not present

## 2017-11-29 DIAGNOSIS — M79671 Pain in right foot: Secondary | ICD-10-CM | POA: Diagnosis not present

## 2017-11-29 DIAGNOSIS — F411 Generalized anxiety disorder: Secondary | ICD-10-CM | POA: Diagnosis not present

## 2017-11-29 DIAGNOSIS — K589 Irritable bowel syndrome without diarrhea: Secondary | ICD-10-CM | POA: Diagnosis not present

## 2017-12-01 DIAGNOSIS — M545 Low back pain: Secondary | ICD-10-CM | POA: Diagnosis not present

## 2017-12-01 DIAGNOSIS — R1084 Generalized abdominal pain: Secondary | ICD-10-CM | POA: Diagnosis not present

## 2017-12-01 DIAGNOSIS — K589 Irritable bowel syndrome without diarrhea: Secondary | ICD-10-CM | POA: Diagnosis not present

## 2017-12-03 ENCOUNTER — Other Ambulatory Visit: Payer: Self-pay | Admitting: Physical Medicine & Rehabilitation

## 2017-12-03 DIAGNOSIS — Z79899 Other long term (current) drug therapy: Secondary | ICD-10-CM

## 2017-12-03 DIAGNOSIS — Z5181 Encounter for therapeutic drug level monitoring: Secondary | ICD-10-CM

## 2017-12-03 DIAGNOSIS — S14145S Brown-Sequard syndrome at C5 level of cervical spinal cord, sequela: Secondary | ICD-10-CM

## 2017-12-03 DIAGNOSIS — G8111 Spastic hemiplegia affecting right dominant side: Secondary | ICD-10-CM

## 2017-12-03 DIAGNOSIS — G894 Chronic pain syndrome: Secondary | ICD-10-CM

## 2017-12-08 ENCOUNTER — Encounter: Payer: Self-pay | Admitting: Physical Medicine & Rehabilitation

## 2017-12-08 ENCOUNTER — Encounter
Payer: Worker's Compensation | Attending: Physical Medicine & Rehabilitation | Admitting: Physical Medicine & Rehabilitation

## 2017-12-08 VITALS — BP 112/76 | HR 88 | Resp 14 | Wt 162.0 lb

## 2017-12-08 DIAGNOSIS — Z87891 Personal history of nicotine dependence: Secondary | ICD-10-CM | POA: Insufficient documentation

## 2017-12-08 DIAGNOSIS — G894 Chronic pain syndrome: Secondary | ICD-10-CM | POA: Insufficient documentation

## 2017-12-08 DIAGNOSIS — K592 Neurogenic bowel, not elsewhere classified: Secondary | ICD-10-CM | POA: Diagnosis not present

## 2017-12-08 DIAGNOSIS — Z8781 Personal history of (healed) traumatic fracture: Secondary | ICD-10-CM | POA: Insufficient documentation

## 2017-12-08 DIAGNOSIS — G8381 Brown-Sequard syndrome: Secondary | ICD-10-CM | POA: Insufficient documentation

## 2017-12-08 DIAGNOSIS — Z5181 Encounter for therapeutic drug level monitoring: Secondary | ICD-10-CM

## 2017-12-08 DIAGNOSIS — G8111 Spastic hemiplegia affecting right dominant side: Secondary | ICD-10-CM | POA: Diagnosis not present

## 2017-12-08 DIAGNOSIS — Z87442 Personal history of urinary calculi: Secondary | ICD-10-CM | POA: Insufficient documentation

## 2017-12-08 DIAGNOSIS — F418 Other specified anxiety disorders: Secondary | ICD-10-CM | POA: Insufficient documentation

## 2017-12-08 DIAGNOSIS — G8929 Other chronic pain: Secondary | ICD-10-CM | POA: Diagnosis present

## 2017-12-08 DIAGNOSIS — S14145S Brown-Sequard syndrome at C5 level of cervical spinal cord, sequela: Secondary | ICD-10-CM | POA: Diagnosis not present

## 2017-12-08 DIAGNOSIS — Z79899 Other long term (current) drug therapy: Secondary | ICD-10-CM

## 2017-12-08 MED ORDER — GABAPENTIN 600 MG PO TABS: 600.0000 mg | ORAL_TABLET | Freq: Every day | ORAL | 5 refills | Status: DC

## 2017-12-08 MED ORDER — GABAPENTIN 600 MG PO TABS: 600.0000 mg | ORAL_TABLET | Freq: Four times a day (QID) | ORAL | 5 refills | Status: DC

## 2017-12-08 MED ORDER — CLONAZEPAM 0.5 MG PO TABS: 0.5000 mg | ORAL_TABLET | Freq: Three times a day (TID) | ORAL | 2 refills | Status: DC | PRN

## 2017-12-08 NOTE — Progress Notes (Signed)
Subjective:    Patient ID: Billy Kelley, male    DOB: [birth year], 42 y.o.   MRN: [id number]  HPI   Billy Kelley is here in follow up of his chronic pain and brown sequard syndrome. He was not approved for botox and thus didn't come in last month. He is working hard on stretching and strengthening at home. He notes that his spasms are worst at night and when he first wakes up in the morning. He remains on baclofen, klonopin, gabentin as prescribed. Gastroenterologist rx'ed bentyl which seemed to help stomach spasms. Since coming off narcotics, he has actually had loose stools.   He remains active. He likes to fish. He was up early this morning and went fishing in fact.    Pain Inventory Average Pain 3 Pain Right Now 3 My pain is constant and sharp  In the last 24 hours, has pain interfered with the following? General activity 3 Relation with others 3 Enjoyment of life 3 What TIME of day is your pain at its worst? morning Sleep (in general) Fair  Pain is worse with: walking Pain improves with: medication Relief from Meds: 5  Mobility Do you have any goals in this area?  no  Function disabled: date disabled .  Neuro/Psych trouble walking spasms dizziness confusion depression anxiety  Prior Studies Any changes since last visit?  no  Physicians involved in your care Any changes since last visit?  no   Family History  Problem Relation Age of Onset  . Hypertension Mother   . Hyperlipidemia Mother   . Cancer Mother 20       endometrial   . Heart disease Father        CHF   . Diabetes Neg Hx    Social History   Socioeconomic History  . Marital status: Single    Spouse name: Not on file  . Number of children: 1   . Years of education: Not on file  . Highest education level: Not on file  Occupational History  . Occupation: Disability     Comment: SSI   Social Needs  . Financial resource strain: Not on file  . Food insecurity:    Worry: Not on file   Inability: Not on file  . Transportation needs:    Medical: Not on file    Non-medical: Not on file  Tobacco Use  . Smoking status: Former Smoker    Packs/day: 0.50    Years: 20.00    Pack years: 10.00    Types: Cigarettes    Last attempt to quit: 06/14/2016    Years since quitting: 1.4  . Smokeless tobacco: Never Used  Substance and Sexual Activity  . Alcohol use: No  . Drug use: Yes    Types: Marijuana    Comment: 2-3 times week   . Sexual activity: Yes    Birth control/protection: Condom  Lifestyle  . Physical activity:    Days per week: Not on file    Minutes per session: Not on file  . Stress: Not on file  Relationships  . Social connections:    Talks on phone: Not on file    Gets together: Not on file    Attends religious service: Not on file    Active member of club or organization: Not on file    Attends meetings of clubs or organizations: Not on file    Relationship status: Not on file  Other Topics Concern  . Not on file  Social History  Narrative   Lives with mom.   Moved from [place] most recently. Also live in [place] in [place].          Past Surgical History:  Procedure Laterality Date  . BRAIN SURGERY    . HERNIA REPAIR Bilateral 1976  . spinal cord fusion  2005    C4-C6    Past Medical History:  Diagnosis Date  . Kidney stone 2012   passed with fluids   . 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, Holiday representative, Statistician, shattered C5 and fused C4-C6    BP 112/76 (BP Location: Right Arm, Patient Position: Sitting, Cuff Size: Normal)   Pulse 88   Resp 14   Wt 162 lb (73.5 kg)   SpO2 94%   BMI 21.97 kg/m   Opioid Risk Score:   Fall Risk Score:  `1  Depression screen PHQ 2/9  Depression screen [facility] 2/9 08/02/2017 08/26/2016 06/01/2016 04/01/2016 10/23/2015 09/30/2015 03/19/2015  Decreased Interest 0 Down, Depressed, Hopeless 0 0 PHQ - 2 Score 0 Altered sleeping - 0 - - 1 - -    Tired, decreased energy - 0 - - 3 - -  Change in appetite - 0 - - 0 - -  Feeling bad or failure about yourself  - 0 - - 1 - -  Trouble concentrating - 0 - - 3 - -  Moving slowly or fidgety/restless - 0 - - 0 - -  Suicidal thoughts - 0 - - 0 - -  PHQ-9 Score - 2 - - 12 - -  Difficult doing work/chores - - - - Very difficult - -    Review of Systems  Constitutional: Negative.   HENT: Negative.   Eyes: Negative.   Respiratory: Negative.   Cardiovascular: Negative.   Gastrointestinal: Negative.   Endocrine: Negative.   Genitourinary: Negative.   Musculoskeletal: Positive for arthralgias, gait problem and myalgias.       Spasms   Skin: Negative.   Allergic/Immunologic: Negative.   Neurological: Positive for dizziness.  Hematological: Negative.   Psychiatric/Behavioral: Positive for confusion and dysphoric mood. The patient is nervous/anxious.        Objective:   Physical Exam  General: No acute distress HEENT: EOMI, oral membranes moist Cards: reg rate  Chest: normal effort Abdomen: Soft, NT, ND Skin: dry, intact Extremities: no edema Skin:Clean and intact. Neuro:Alert and oriented x3. CN exam intact. 3+ to 4/5 deltoid, bicep. 2-3/5 wrist and elbow extension, HI, 2/5 grip.Finger extensors remain very weak. Finger fexor tone RUE 1/4,     LT,PP 1+/2 RUE. LUE nearly 5/5 prox to distal. Normal sensory LUE. RLE: 2 HF, HE, 2- KE and KF, trace ADF/eversion, absent PF. Tone at ankle is 1/4. Fluctuating tone knee at tr to 1/4. Pt's LLE: trace to 0/2 sensation. Strength grossly 4 to 4+/5.  walks with steppage gait bilaterally (not using AFO) Musculoskeletal:  Right lumbar paraspinals with spasms and tenderness with palpation. Psych:Pt's affect is appropriate   Assessment & Plan:  1. C5 fx/SCI with [name] Presentation  2. Spastic right hemiparesis  3. Neurogenic bowel  4. Reactive depression with anxiety, ?PTSD 5. Neuropathic pain due to  #1    Plan:  1. Bowel spasms per GI 2. He is non-narcotic from this office. He may use OTC NSAID's for joint pain.  3. Botox 300u RUE. Continue stretching and strengthening as he's doing. His daily work shows.   4. Continue  Gabapentin   BID and  qhs 5. Continue with HEP. Described the importance of gait mechanics, posture -AFO would be most ideal for gait (he is not using currently).  -splinting as possible  6. Continue baclofen for now  QID. klonopin to 0.5mg  q8.  7.continue with smoking cessation.    of face to face patient care time were spent during this visit. All questions were encouraged and answered. .follow up in a month.

## 2017-12-08 NOTE — Patient Instructions (Signed)
PLEASE FEEL FREE TO CALL OUR OFFICE WITH ANY PROBLEMS OR QUESTIONS ([phone])      

## 2017-12-15 ENCOUNTER — Telehealth: Payer: Self-pay | Admitting: Physical Medicine & Rehabilitation

## 2017-12-15 NOTE — Telephone Encounter (Signed)
Patient is requesting a new referral for a brace right leg.  The one that he has is old and cracked.  The [facility] will not let him get a new one without order for it.  Patient needs this faxed to his worker's comp at [phone] attn: Diane.  Please call [name] when this order is done and he will come pick up the original.

## 2017-12-17 NOTE — Telephone Encounter (Signed)
I can complete an order via computer, but it's probably best just to complete by hand on Monday. Just remind me Monday morning. thx

## 2017-12-30 ENCOUNTER — Telehealth: Payer: Self-pay | Admitting: Physical Medicine & Rehabilitation

## 2017-12-30 NOTE — Telephone Encounter (Signed)
Per [place] wrk comp Almon RegisterK Schell denied case is established to neck only scanned response attached to med rec lk [phone]

## 2018-01-03 ENCOUNTER — Encounter: Payer: Self-pay | Admitting: Physical Medicine & Rehabilitation

## 2018-01-03 ENCOUNTER — Encounter
Payer: Worker's Compensation | Attending: Physical Medicine & Rehabilitation | Admitting: Physical Medicine & Rehabilitation

## 2018-01-03 VITALS — BP 120/75 | HR 91 | Ht 73.0 in | Wt 169.6 lb

## 2018-01-03 DIAGNOSIS — G8111 Spastic hemiplegia affecting right dominant side: Secondary | ICD-10-CM

## 2018-01-03 NOTE — Progress Notes (Signed)
Botox Injection for spasticity using needle EMG guidance Indication: Right spastic hemiparesis (HCC)   Dilution: 100 Units/ml        Total Units Injected: 300 Indication: Severe spasticity which interferes with ADL,mobility and/or  hygiene and is unresponsive to medication management and other conservative care Informed consent was obtained after describing risks and benefits of the procedure with the patient. This includes bleeding, bruising, infection, excessive weakness, or medication side effects. A REMS form is on file and signed.  Needle: 50mm injectable monopolar needle electrode  Number of units per muscle Pectoralis Major 0 units Pectoralis Minor 0 units Biceps 0 units Brachioradialis 0 units FCR 20 units FCU 20 units FDS 80 units FDP 80 units FPL 0 units Pronator Teres 0 units LUMBRICALS (4), 25U each100 units  All injections were done after obtaining appropriate EMG activity and after negative drawback for blood. The patient tolerated the procedure well. Post procedure instructions were given. Return in about 3 months (around 04/05/2018).

## 2018-01-03 NOTE — Patient Instructions (Signed)
PLEASE FEEL FREE TO CALL OUR OFFICE WITH ANY PROBLEMS OR QUESTIONS ([phone])      

## 2018-01-13 ENCOUNTER — Other Ambulatory Visit: Payer: Self-pay | Admitting: Physical Medicine & Rehabilitation

## 2018-01-13 DIAGNOSIS — Z5181 Encounter for therapeutic drug level monitoring: Secondary | ICD-10-CM

## 2018-01-13 DIAGNOSIS — G8111 Spastic hemiplegia affecting right dominant side: Secondary | ICD-10-CM

## 2018-01-13 DIAGNOSIS — Z79899 Other long term (current) drug therapy: Secondary | ICD-10-CM

## 2018-01-13 DIAGNOSIS — G894 Chronic pain syndrome: Secondary | ICD-10-CM

## 2018-01-13 DIAGNOSIS — S14145S Brown-Sequard syndrome at C5 level of cervical spinal cord, sequela: Secondary | ICD-10-CM

## 2018-01-17 DIAGNOSIS — G8929 Other chronic pain: Secondary | ICD-10-CM | POA: Diagnosis not present

## 2018-01-17 DIAGNOSIS — F329 Major depressive disorder, single episode, unspecified: Secondary | ICD-10-CM | POA: Diagnosis not present

## 2018-01-17 DIAGNOSIS — R1084 Generalized abdominal pain: Secondary | ICD-10-CM | POA: Diagnosis not present

## 2018-01-17 DIAGNOSIS — F411 Generalized anxiety disorder: Secondary | ICD-10-CM | POA: Diagnosis not present

## 2018-01-18 DIAGNOSIS — R1084 Generalized abdominal pain: Secondary | ICD-10-CM | POA: Diagnosis not present

## 2018-01-26 DIAGNOSIS — K589 Irritable bowel syndrome without diarrhea: Secondary | ICD-10-CM | POA: Diagnosis not present

## 2018-01-26 DIAGNOSIS — M545 Low back pain: Secondary | ICD-10-CM | POA: Diagnosis not present

## 2018-01-26 DIAGNOSIS — G8929 Other chronic pain: Secondary | ICD-10-CM | POA: Diagnosis not present

## 2018-01-26 DIAGNOSIS — R1084 Generalized abdominal pain: Secondary | ICD-10-CM | POA: Diagnosis not present

## 2018-02-02 DIAGNOSIS — R918 Other nonspecific abnormal finding of lung field: Secondary | ICD-10-CM | POA: Diagnosis not present

## 2018-02-13 ENCOUNTER — Other Ambulatory Visit: Payer: Self-pay | Admitting: Physical Medicine & Rehabilitation

## 2018-02-14 ENCOUNTER — Other Ambulatory Visit: Payer: Self-pay | Admitting: Physical Medicine & Rehabilitation

## 2018-02-15 ENCOUNTER — Telehealth: Payer: Self-pay | Admitting: *Deleted

## 2018-02-15 MED ORDER — FLUOXETINE HCL 20 MG PO CAPS: ORAL_CAPSULE | ORAL | 3 refills | Status: DC

## 2018-02-15 NOTE — Telephone Encounter (Addendum)
Billy Kelley called requesting (if) Dr Riley KillSwartz would be willing to write his prozac 20 mg iii daily for him.  The psychiatrist that was prescribing quit. (job?) Please advise.

## 2018-02-15 NOTE — Telephone Encounter (Signed)
done

## 2018-02-17 ENCOUNTER — Other Ambulatory Visit: Payer: Self-pay | Admitting: Physical Medicine & Rehabilitation

## 2018-02-24 DIAGNOSIS — F332 Major depressive disorder, recurrent severe without psychotic features: Secondary | ICD-10-CM | POA: Diagnosis not present

## 2018-02-24 DIAGNOSIS — F902 Attention-deficit hyperactivity disorder, combined type: Secondary | ICD-10-CM | POA: Diagnosis not present

## 2018-02-24 DIAGNOSIS — F411 Generalized anxiety disorder: Secondary | ICD-10-CM | POA: Diagnosis not present

## 2018-02-24 DIAGNOSIS — F4312 Post-traumatic stress disorder, chronic: Secondary | ICD-10-CM | POA: Diagnosis not present

## 2018-02-28 DIAGNOSIS — Z01818 Encounter for other preprocedural examination: Secondary | ICD-10-CM | POA: Diagnosis not present

## 2018-02-28 DIAGNOSIS — K219 Gastro-esophageal reflux disease without esophagitis: Secondary | ICD-10-CM | POA: Diagnosis not present

## 2018-03-04 DIAGNOSIS — K2270 Barrett's esophagus without dysplasia: Secondary | ICD-10-CM | POA: Diagnosis not present

## 2018-03-09 ENCOUNTER — Other Ambulatory Visit: Payer: Self-pay | Admitting: Physical Medicine & Rehabilitation

## 2018-03-09 DIAGNOSIS — F332 Major depressive disorder, recurrent severe without psychotic features: Secondary | ICD-10-CM | POA: Diagnosis not present

## 2018-03-31 DIAGNOSIS — M5136 Other intervertebral disc degeneration, lumbar region: Secondary | ICD-10-CM | POA: Diagnosis not present

## 2018-03-31 DIAGNOSIS — M5440 Lumbago with sciatica, unspecified side: Secondary | ICD-10-CM | POA: Diagnosis not present

## 2018-03-31 DIAGNOSIS — K2970 Gastritis, unspecified, without bleeding: Secondary | ICD-10-CM | POA: Diagnosis not present

## 2018-04-02 DIAGNOSIS — J020 Streptococcal pharyngitis: Secondary | ICD-10-CM | POA: Diagnosis not present

## 2018-04-02 DIAGNOSIS — L03012 Cellulitis of left finger: Secondary | ICD-10-CM | POA: Diagnosis not present

## 2018-04-02 DIAGNOSIS — J029 Acute pharyngitis, unspecified: Secondary | ICD-10-CM | POA: Diagnosis not present

## 2018-04-03 ENCOUNTER — Emergency Department (HOSPITAL_COMMUNITY)
Admission: EM | Admit: 2018-04-03 | Discharge: 2018-04-03 | Disposition: A | Discharge status: Home / Self Care | Payer: Medicare HMO | Attending: Emergency Medicine | Admitting: Emergency Medicine

## 2018-04-03 ENCOUNTER — Other Ambulatory Visit: Payer: Self-pay

## 2018-04-03 ENCOUNTER — Emergency Department (HOSPITAL_COMMUNITY): Payer: Medicare HMO

## 2018-04-03 ENCOUNTER — Encounter (HOSPITAL_COMMUNITY): Payer: Self-pay | Admitting: Emergency Medicine

## 2018-04-03 DIAGNOSIS — F329 Major depressive disorder, single episode, unspecified: Secondary | ICD-10-CM | POA: Diagnosis not present

## 2018-04-03 DIAGNOSIS — M79645 Pain in left finger(s): Secondary | ICD-10-CM | POA: Diagnosis present

## 2018-04-03 DIAGNOSIS — Z79899 Other long term (current) drug therapy: Secondary | ICD-10-CM | POA: Diagnosis not present

## 2018-04-03 DIAGNOSIS — Z23 Encounter for immunization: Secondary | ICD-10-CM | POA: Diagnosis not present

## 2018-04-03 DIAGNOSIS — L03012 Cellulitis of left finger: Secondary | ICD-10-CM | POA: Diagnosis not present

## 2018-04-03 DIAGNOSIS — Z87891 Personal history of nicotine dependence: Secondary | ICD-10-CM | POA: Diagnosis not present

## 2018-04-03 DIAGNOSIS — F419 Anxiety disorder, unspecified: Secondary | ICD-10-CM | POA: Insufficient documentation

## 2018-04-03 DIAGNOSIS — M7989 Other specified soft tissue disorders: Secondary | ICD-10-CM | POA: Diagnosis not present

## 2018-04-03 DIAGNOSIS — R52 Pain, unspecified: Secondary | ICD-10-CM

## 2018-04-03 DIAGNOSIS — S6992XA Unspecified injury of left wrist, hand and finger(s), initial encounter: Secondary | ICD-10-CM | POA: Diagnosis not present

## 2018-04-03 MED ORDER — CLINDAMYCIN HCL 300 MG PO CAPS: 300.0000 mg | ORAL_CAPSULE | Freq: Four times a day (QID) | ORAL | 0 refills | Status: AC

## 2018-04-03 MED ORDER — LIDOCAINE HCL (PF) 1 % IJ SOLN
5.0000 mL | Freq: Once | INTRAMUSCULAR | Status: AC
  Administered 2018-04-03: 5 mL
  Filled 2018-04-03: qty 5

## 2018-04-03 MED ORDER — TETANUS-DIPHTH-ACELL PERTUSSIS 5-2.5-18.5 LF-MCG/0.5 IM SUSP
0.5000 mL | Freq: Once | INTRAMUSCULAR | Status: AC
  Administered 2018-04-03: 0.5 mL via INTRAMUSCULAR
  Filled 2018-04-03: qty 0.5

## 2018-04-03 MED ORDER — LEVOFLOXACIN 750 MG PO TABS: 750.0000 mg | ORAL_TABLET | Freq: Every day | ORAL | 0 refills | Status: AC

## 2018-04-03 NOTE — ED Triage Notes (Signed)
Pt. Stated, I got a fish hook in there , went to Dr. Burgess EstelleYesterday , and put me on Keflex cause I have strep throat also. Dr.'s office called and said I need to see a hand specialist.

## 2018-04-03 NOTE — Discharge Instructions (Addendum)
Warm compresses to hand for 20 minutes at a time. Discontinue Keflex.  Take clindamycin and Levaquin as prescribed.  This should cover your finger infection as well as your strep throat. Return to the emergency room immediately for any worsening or concerning symptoms as discussed. Contact orthopedics tomorrow for appointment in 1 to 2 days, referral given.

## 2018-04-03 NOTE — ED Provider Notes (Signed)
[facility] Provider Note   CSN: [id number] Arrival date & time: 04/03/18  1010     History   Chief Complaint Chief Complaint  Patient presents with  . Finger Injury    HPI Billy Kelley is a 42 y.o. male.  42 year old male presents with complaint of left index finger infection.  Patient is left-hand dominant with limited use of his right hand due to previous C5 spinal cord injury.  Patient states he was fishing on Thursday when he was stuck in the left index finger by the [place]hook [place], states the hook had been in the lake water, states the hook was stuck in his finger up to the barb, barb was not in the finger, hook was easily removed by pulling it out, patient then stuck his finger in his mouth.  Patient states he woke up Friday morning with a sore throat and fever of 102.7, also pain and swelling in his finger, went to his PCP on Saturday where he had a positive strep test and was given Keflex for the concerns for finger infection and strep throat.  Patient states his PCP called him Saturday evening and advised him to see a hand surgeon.  Patient reports ongoing pain and swelling in his left index finger.  No pain with movement of the remaining fingers in the hand.  Denies fevers today but not taking fever reducing medication.  No other injuries, complaints or concerns.  Last tetanus was greater than 5 years ago.     Past Medical History:  Diagnosis Date  . Kidney stone 2012   passed with fluids   . 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 Chronic pain syndrome 12/08/2017  . Erectile dysfunction 08/25/2016  . Dysphagia 07/08/2016  . Low back pain 03/08/2015  . Right low back pain 12/19/2014  . Fatigue 12/19/2014  . Neck muscle spasm 12/19/2014  . Allergic rhinitis  12/19/2014  . Neurogenic bowel 10/01/2014  . Right spastic hemiparesis (HCC) 08/22/2014  . 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 Neurogenic bladder 06/20/2014  . Low BP 03/22/2014  . Depression 03/09/2014  . Anxiety 03/09/2014  . Smoker 03/09/2014  . Closed fracture of C5-C7 level with incomplete spinal cord lesion     Past Surgical History:  Procedure Laterality Date  . BRAIN SURGERY    . HERNIA REPAIR Bilateral 1976  . spinal cord fusion  2005    C4-C6         Home Medications    Prior to Admission medications   Medication Sig Start Date End Date Taking? Authorizing Provider  baclofen (LIORESAL) 20 MG tablet TAKE 1 TABLET BY MOUTH FOUR TIMES DAILY 02/17/18   Ranelle OysterSwartz, Zachary T, MD  Cholecalciferol (VITAMIN D3) 2000 UNITS TABS Take 2,000 Units by mouth daily. 12/20/14   Funches, Gerilyn NestleJosalyn, MD  clindamycin (CLEOCIN) 300 MG capsule Take 1 capsule (300 mg total) by mouth 4 (four) times daily for 7 days. 04/03/18 04/10/18  Jeannie FendMurphy, Arjuna Doeden A, PA-C  clonazePAM (KLONOPIN) 0.5 MG tablet Take 1 tablet (0.5 mg total) by mouth 3 (three) times daily as needed. 12/08/17   Ranelle OysterSwartz, Zachary T, MD  clonazePAM (KLONOPIN) 0.5 MG tablet TAKE 1 TABLET(0.5 MG) BY MOUTH THREE TIMES DAILY  AS NEEDED 03/09/18   Ranelle Oyster, MD  FLUoxetine (PROZAC) 20 MG capsule TAKE 3 CAPSULES(60 MG) BY MOUTH DAILY 02/15/18   Ranelle Oyster, MD  gabapentin (NEURONTIN) 600 MG tablet Take 1 tablet (600 mg total) by mouth 5 (five) times daily. 12/08/17   Ranelle Oyster, MD  gabapentin (NEURONTIN) 600 MG tablet TAKE 1 TABLET(600 MG) BY MOUTH FIVE TIMES DAILY 01/14/18   Ranelle Oyster, MD  levofloxacin (LEVAQUIN) 750 MG tablet Take 1 tablet (750 mg total) by mouth daily for 7 days. 04/03/18 04/10/18  Jeannie Fend, PA-C  sildenafil (VIAGRA) 100 MG tablet TAKE 1/2 TO 1 TABLET BY MOUTH DAILY AS NEEDED FOR ERECTILE DYSFUNCTION 12/22/16   Dessa Phi, MD  traZODone (DESYREL) 50 MG tablet  Take 1/2- 1  tablet at Bedtime 01/27/16   Jones Bales, NP    Family History Family History  Problem Relation Age of Onset  . Hypertension Mother   . Hyperlipidemia Mother   . Cancer Mother 68       endometrial   . Heart disease Father        CHF   . Diabetes Neg Hx     Social History Social History   Tobacco Use  . Smoking status: Former Smoker    Packs/day: 0.50    Years: 20.00    Pack years: 10.00    Types: Cigarettes    Last attempt to quit: 06/14/2016    Years since quitting: 1.8  . Smokeless tobacco: Never Used  Substance Use Topics  . Alcohol use: No  . Drug use: Yes    Types: Marijuana    Comment: 2-3 times week      Allergies   Patient has no known allergies.   Review of Systems Review of Systems  Constitutional: Negative for fever.  HENT: Negative for sore throat.   Musculoskeletal: Positive for arthralgias, joint swelling and myalgias.  Skin: Positive for color change and wound. Negative for rash.  Allergic/Immunologic: Negative for immunocompromised state.  Neurological: Negative for weakness and numbness.  Hematological: Does not bruise/bleed easily.  Psychiatric/Behavioral: Negative for self-injury.  All other systems reviewed and are negative.    Physical Exam Updated Vital Signs BP 112/80 (BP Location: Right Arm)   Pulse 95   Temp 98.8 F (37.1 C) (Oral)   Resp 17   Ht 6\' 1"  (1.854 m)   Wt 77.1 kg   SpO2 97%   BMI 22.43 kg/m   Physical Exam  Constitutional: He is oriented to person, place, and time. He appears well-developed and well-nourished. No distress.  HENT:  Head: Normocephalic and atraumatic.  Cardiovascular: Intact distal pulses.  Pulmonary/Chest: Effort normal.  Musculoskeletal: He exhibits tenderness. He exhibits no deformity.       Hands: Neurological: He is alert and oriented to person, place, and time. No sensory deficit.  Skin: Skin is warm and dry. He is not diaphoretic. There is erythema.  Psychiatric: He  has a normal mood and affect. His behavior is normal.  Nursing note and vitals reviewed.    ED Treatments / Results  Labs (all labs ordered are listed, but only abnormal results are displayed) Labs Reviewed - No data to display  EKG None  Radiology Dg Finger Index Left  Result Date: 04/03/2018 CLINICAL DATA:  Pt reports he got a fish hook in his left index finger on Thurs; pain to distal phalanx; redness and swelling from tip of finger up to DIP joint EXAM: LEFT INDEX  FINGER 2+V COMPARISON:  None. FINDINGS: No residual radiopaque foreign body. No fracture. Joints are normally spaced and aligned. No evidence of osteomyelitis. There is distal soft tissue swelling.  No soft tissue air. IMPRESSION: No fracture or radiopaque foreign body.  No soft tissue air. Electronically Signed   By: Amie Portland M.D.   On: 04/03/2018 11:47    Procedures .Marland KitchenIncision and Drainage Date/Time: 04/03/2018 12:42 PM Performed by: Jeannie Fend, PA-C Authorized by: Jeannie Fend, PA-C   Consent:    Consent obtained:  Verbal   Consent given by:  Patient   Risks discussed:  Bleeding, incomplete drainage, infection and pain   Alternatives discussed:  No treatment Location:    Type:  Abscess   Location:  Upper extremity   Upper extremity location:  Finger   Finger location:  L index finger Pre-procedure details:    Skin preparation:  Betadine Anesthesia (see MAR for exact dosages):    Anesthesia method:  Nerve block   Block location:  Digital block   Block needle gauge:  27 G   Block anesthetic:  Lidocaine 1% w/o epi   Block technique:  Digital block   Block injection procedure:  Anatomic landmarks identified, anatomic landmarks palpated, negative aspiration for blood, introduced needle and incremental injection   Block outcome:  Anesthesia achieved Procedure type:    Complexity:  Simple Procedure details:    Needle aspiration: yes     Needle size:  25 G   Incision types:  Single straight    Incision depth:  Subcutaneous   Scalpel blade:  11   Wound management:  Probed and deloculated and irrigated with saline   Drainage:  Purulent   Drainage amount:  Scant   Wound treatment:  Wound left open   Packing materials:  None Post-procedure details:    Patient tolerance of procedure:  Tolerated well, no immediate complications   (including critical care time)  Medications Ordered in ED Medications  lidocaine (PF) (XYLOCAINE) 1 % injection 5 mL (5 mLs Infiltration Given 04/03/18 1228)  Tdap (BOOSTRIX) injection 0.5 mL (0.5 mLs Intramuscular Given 04/03/18 1227)     Initial Impression / Assessment and Plan / ED Course  I have reviewed the triage vital signs and the nursing notes.  Pertinent labs & imaging results that were available during my care of the patient were reviewed by me and considered in my medical decision making (see chart for details).  Clinical Course as of Apr 03 1348  Sun Apr 03, 2018  1343 41yo male with left index finger infection, seen by PCP UC yesterday and started on Keflex for the finger infection and strep throat. On exam, felon vs more superficial abscess, no pain with ROM at the MCP or PIP, no pain with palpation along the flexor or extensor tendon to suggest tenosynovitis. XR is negative for Fb/gas/osteo. Digital block with relief of pain, midline incision with bloody drainage, small amount of purulent drainage from puncture site an anterior/radial aspect of pad of finger. Patient advised to dc keflex, given Clinda and Levaquin per UTD for infection with lake water exposure, should also cover his strep throat. Advised to follow up with hand ortho in 1-2 days, given strict return precautions. Patient and family verbalize understanding of dc instructions.   [LM]    Clinical Course User Index [LM] Jeannie Fend, PA-C   Final Clinical Impressions(s) / ED Diagnoses   Final diagnoses:  Felon of finger of left hand    ED  Discharge Orders          Ordered    levofloxacin (LEVAQUIN) 750 MG tablet  Daily     04/03/18 1218    clindamycin (CLEOCIN) 300 MG capsule  4 times daily     04/03/18 1218           Jeannie Fend, PA-C 04/03/18 1349    Benjiman Core, MD 04/03/18 [phone]

## 2018-04-05 ENCOUNTER — Encounter (HOSPITAL_BASED_OUTPATIENT_CLINIC_OR_DEPARTMENT_OTHER): Payer: Self-pay

## 2018-04-05 ENCOUNTER — Encounter (HOSPITAL_BASED_OUTPATIENT_CLINIC_OR_DEPARTMENT_OTHER)
Admission: RE | Disposition: A | Discharge status: Home / Self Care | Payer: Self-pay | Source: Ambulatory Visit | Attending: Orthopedic Surgery

## 2018-04-05 ENCOUNTER — Ambulatory Visit (HOSPITAL_BASED_OUTPATIENT_CLINIC_OR_DEPARTMENT_OTHER)
Admission: RE | Admit: 2018-04-05 | Discharge: 2018-04-05 | Disposition: A | Discharge status: Home / Self Care | Payer: Medicare HMO | Source: Ambulatory Visit | Attending: Orthopedic Surgery | Admitting: Orthopedic Surgery

## 2018-04-05 ENCOUNTER — Other Ambulatory Visit: Payer: Self-pay

## 2018-04-05 ENCOUNTER — Other Ambulatory Visit: Payer: Self-pay | Admitting: Orthopedic Surgery

## 2018-04-05 ENCOUNTER — Ambulatory Visit (HOSPITAL_BASED_OUTPATIENT_CLINIC_OR_DEPARTMENT_OTHER): Payer: Medicare HMO | Admitting: Anesthesiology

## 2018-04-05 DIAGNOSIS — Z87891 Personal history of nicotine dependence: Secondary | ICD-10-CM | POA: Diagnosis not present

## 2018-04-05 DIAGNOSIS — Z79899 Other long term (current) drug therapy: Secondary | ICD-10-CM | POA: Diagnosis not present

## 2018-04-05 DIAGNOSIS — F329 Major depressive disorder, single episode, unspecified: Secondary | ICD-10-CM | POA: Insufficient documentation

## 2018-04-05 DIAGNOSIS — F419 Anxiety disorder, unspecified: Secondary | ICD-10-CM | POA: Insufficient documentation

## 2018-04-05 DIAGNOSIS — L03012 Cellulitis of left finger: Secondary | ICD-10-CM | POA: Diagnosis not present

## 2018-04-05 DIAGNOSIS — G8381 Brown-Sequard syndrome: Secondary | ICD-10-CM | POA: Insufficient documentation

## 2018-04-05 DIAGNOSIS — L089 Local infection of the skin and subcutaneous tissue, unspecified: Secondary | ICD-10-CM | POA: Diagnosis not present

## 2018-04-05 HISTORY — PX: INCISION AND DRAINAGE ABSCESS: SHX5864

## 2018-04-05 SURGERY — INCISION AND DRAINAGE, ABSCESS
Anesthesia: General | Site: Hand | Laterality: Left

## 2018-04-05 MED ORDER — BUPIVACAINE HCL (PF) 0.25 % IJ SOLN
INTRAMUSCULAR | Status: DC | PRN
  Administered 2018-04-05: 8 mL

## 2018-04-05 MED ORDER — MIDAZOLAM HCL 2 MG/2ML IJ SOLN: 1.0000 mg | INTRAMUSCULAR | Status: DC | PRN

## 2018-04-05 MED ORDER — HYDROCODONE-ACETAMINOPHEN 10-325 MG PO TABS: 1.0000 | ORAL_TABLET | Freq: Four times a day (QID) | ORAL | 0 refills | Status: DC | PRN

## 2018-04-05 MED ORDER — LIDOCAINE HCL (CARDIAC) PF 100 MG/5ML IV SOSY
PREFILLED_SYRINGE | INTRAVENOUS | Status: DC | PRN
  Administered 2018-04-05: 30 mg via INTRAVENOUS

## 2018-04-05 MED ORDER — MIDAZOLAM HCL 2 MG/2ML IJ SOLN
INTRAMUSCULAR | Status: AC
  Filled 2018-04-05: qty 2

## 2018-04-05 MED ORDER — FENTANYL CITRATE (PF) 100 MCG/2ML IJ SOLN: 25.0000 ug | INTRAMUSCULAR | Status: DC | PRN

## 2018-04-05 MED ORDER — ONDANSETRON HCL 4 MG/2ML IJ SOLN
INTRAMUSCULAR | Status: DC | PRN
  Administered 2018-04-05: 4 mg via INTRAVENOUS

## 2018-04-05 MED ORDER — FENTANYL CITRATE (PF) 100 MCG/2ML IJ SOLN: 50.0000 ug | INTRAMUSCULAR | Status: DC | PRN

## 2018-04-05 MED ORDER — LACTATED RINGERS IV SOLN
INTRAVENOUS | Status: DC
  Administered 2018-04-05 (×3): via INTRAVENOUS

## 2018-04-05 MED ORDER — CHLORHEXIDINE GLUCONATE 4 % EX LIQD: 60.0000 mL | Freq: Once | CUTANEOUS | Status: DC

## 2018-04-05 MED ORDER — FENTANYL CITRATE (PF) 100 MCG/2ML IJ SOLN
INTRAMUSCULAR | Status: DC | PRN
  Administered 2018-04-05: 100 ug via INTRAVENOUS

## 2018-04-05 MED ORDER — FENTANYL CITRATE (PF) 100 MCG/2ML IJ SOLN
INTRAMUSCULAR | Status: AC
  Filled 2018-04-05: qty 2

## 2018-04-05 MED ORDER — CEFAZOLIN SODIUM-DEXTROSE 2-4 GM/100ML-% IV SOLN
INTRAVENOUS | Status: AC
  Filled 2018-04-05: qty 100

## 2018-04-05 MED ORDER — CEFAZOLIN SODIUM-DEXTROSE 2-4 GM/100ML-% IV SOLN
2.0000 g | INTRAVENOUS | Status: AC
  Administered 2018-04-05: 2 g via INTRAVENOUS

## 2018-04-05 MED ORDER — MIDAZOLAM HCL 5 MG/5ML IJ SOLN
INTRAMUSCULAR | Status: DC | PRN
  Administered 2018-04-05: 2 mg via INTRAVENOUS

## 2018-04-05 MED ORDER — DEXAMETHASONE SODIUM PHOSPHATE 4 MG/ML IJ SOLN
INTRAMUSCULAR | Status: DC | PRN
  Administered 2018-04-05: 10 mg via INTRAVENOUS

## 2018-04-05 MED ORDER — PHENYLEPHRINE HCL 10 MG/ML IJ SOLN
INTRAMUSCULAR | Status: DC | PRN
  Administered 2018-04-05 (×3): 80 ug via INTRAVENOUS

## 2018-04-05 SURGICAL SUPPLY — 48 items
BAG DECANTER FOR FLEXI CONT (MISCELLANEOUS) IMPLANT
BLADE MINI RND TIP GREEN BEAV (BLADE) IMPLANT
BLADE SURG 15 STRL LF DISP TIS (BLADE) ×1 IMPLANT
BLADE SURG 15 STRL SS (BLADE) ×2
BNDG COHESIVE 1X5 TAN STRL LF (GAUZE/BANDAGES/DRESSINGS) ×3 IMPLANT
BNDG COHESIVE 3X5 TAN STRL LF (GAUZE/BANDAGES/DRESSINGS) IMPLANT
BNDG ESMARK 4X9 LF (GAUZE/BANDAGES/DRESSINGS) ×3 IMPLANT
BNDG GAUZE ELAST 4 BULKY (GAUZE/BANDAGES/DRESSINGS) IMPLANT
CHLORAPREP W/TINT 26ML (MISCELLANEOUS) ×3 IMPLANT
CORD BIPOLAR FORCEPS 12FT (ELECTRODE) ×3 IMPLANT
COVER BACK TABLE 60X90IN (DRAPES) ×3 IMPLANT
COVER MAYO STAND STRL (DRAPES) ×3 IMPLANT
CUFF TOURNIQUET SINGLE 18IN (TOURNIQUET CUFF) ×3 IMPLANT
DRAPE EXTREMITY T 121X128X90 (DRAPE) ×3 IMPLANT
DRAPE SURG 17X23 STRL (DRAPES) ×3 IMPLANT
GAUZE PACKING IODOFORM 1/4X15 (GAUZE/BANDAGES/DRESSINGS) IMPLANT
GAUZE PACKING IODOFORM 1/4X5 (PACKING) ×3 IMPLANT
GAUZE SPONGE 4X4 12PLY STRL (GAUZE/BANDAGES/DRESSINGS) ×3 IMPLANT
GAUZE XEROFORM 1X8 LF (GAUZE/BANDAGES/DRESSINGS) ×3 IMPLANT
GLOVE BIOGEL PI IND STRL 7.0 (GLOVE) ×3 IMPLANT
GLOVE BIOGEL PI IND STRL 8.5 (GLOVE) ×1 IMPLANT
GLOVE BIOGEL PI INDICATOR 7.0 (GLOVE) ×6
GLOVE BIOGEL PI INDICATOR 8.5 (GLOVE) ×2
GLOVE ECLIPSE 6.5 STRL STRAW (GLOVE) ×6 IMPLANT
GLOVE SURG ORTHO 8.0 STRL STRW (GLOVE) ×3 IMPLANT
GOWN STRL REUS W/ TWL LRG LVL3 (GOWN DISPOSABLE) ×1 IMPLANT
GOWN STRL REUS W/TWL LRG LVL3 (GOWN DISPOSABLE) ×2
GOWN STRL REUS W/TWL XL LVL3 (GOWN DISPOSABLE) ×3 IMPLANT
LOOP VESSEL MAXI BLUE (MISCELLANEOUS) IMPLANT
NEEDLE PRECISIONGLIDE 27X1.5 (NEEDLE) ×3 IMPLANT
NS IRRIG 1000ML POUR BTL (IV SOLUTION) ×3 IMPLANT
PACK BASIN DAY SURGERY FS (CUSTOM PROCEDURE TRAY) ×3 IMPLANT
PAD CAST 3X4 CTTN HI CHSV (CAST SUPPLIES) IMPLANT
PADDING CAST ABS 4INX4YD NS (CAST SUPPLIES) ×2
PADDING CAST ABS COTTON 4X4 ST (CAST SUPPLIES) ×1 IMPLANT
PADDING CAST COTTON 3X4 STRL (CAST SUPPLIES)
SPLINT FINGER 3.25 BULB 911905 (SOFTGOODS) ×3 IMPLANT
SPLINT PLASTER CAST XFAST 3X15 (CAST SUPPLIES) IMPLANT
SPLINT PLASTER XTRA FASTSET 3X (CAST SUPPLIES)
STOCKINETTE 4X48 STRL (DRAPES) ×3 IMPLANT
SUT ETHILON 4 0 PS 2 18 (SUTURE) ×3 IMPLANT
SWAB COLLECTION DEVICE MRSA (MISCELLANEOUS) IMPLANT
SWAB CULTURE ESWAB REG 1ML (MISCELLANEOUS) ×3 IMPLANT
SYR BULB 3OZ (MISCELLANEOUS) ×3 IMPLANT
SYR CONTROL 10ML LL (SYRINGE) ×3 IMPLANT
TOWEL GREEN STERILE FF (TOWEL DISPOSABLE) ×3 IMPLANT
TUBE FEEDING ENTERAL 5FR 16IN (TUBING) IMPLANT
UNDERPAD 30X30 (UNDERPADS AND DIAPERS) ×3 IMPLANT

## 2018-04-05 NOTE — Op Note (Signed)
NAME: Billy BuggyScott Kelley MEDICAL RECORD NO: [id number] DATE OF BIRTH: [birth year] [facility] PHYSICIAN: Nicki ReaperGARY R. Zyana Amaro, MD   OPERATIVE REPORT   DATE OF PROCEDURE: 04/05/18    PREOPERATIVE DIAGNOSIS:   Paronychia felon left index finger   POSTOPERATIVE DIAGNOSIS:   Same   PROCEDURE:   Incision and drainage felon removal nail plate left index finger   SURGEON: Cindee SaltGary Roshawn Lacina, M.D.   ASSISTANT: none   ANESTHESIA:  General and Local   INTRAVENOUS FLUIDS:  Per anesthesia flow sheet.   ESTIMATED BLOOD LOSS:  Minimal.   COMPLICATIONS:  None.   SPECIMENS:   Cultures and aerobic anaerobic fungal mycobacterial   TOURNIQUET TIME:    Total Tourniquet Time Documented: Forearm (Left) - 12 minutes Total: Forearm (Left) - 12 minutes    DISPOSITION:  Stable to PACU.   INDICATIONS: Patient is a 42 year old male who punctured his left index finger while fishing he has had an incision and drainage at the emergency room with significant relief of symptoms.  Has pain swelling that he has been on 2 antibiotics.  Continues to have swelling pain with raising erythema.  He is admitted for incision and drainage removal of his nail plate with incision and drainage for pulp infection left index finger he is aware the possibility of mycobacterial infection.  OPERATIVE COURSE: Procedure the patient brought to the operating room where a general anesthetic was carried out without difficulty.  He was prepped using Betadine scrub and solution with the left hand free.  After adequate anesthesia was afforded timeout taken confirming patient procedure.  The limb was exsanguinated with an Esmarch bandage turn placed on the form was inflated to 250 mmHg.  The nail plate was undermined and that he had erythema about the nail matrix.  Nail plate was removed.  Minimal fluid exudate exuded.  A an incision was then made in the mid lateral line of the index finger.  Directly under the  area of puncture.  The old incision was centrally in the tip.  Desquamated skin was excised.  Pus was immediately encountered in the pulp.  Sharp blade was then used to cut septic from radial to ulnar side.  Ulcers were taken including a portion of necrotic tissue.  This was sent for aerobic anaerobic mycobacterial and fungal cultures.  The wound was copiously irrigated with saline and packed with iodoform cauterization iodoform gauze was placed in the nail fold.  A sterile compressive dressing splint to the finger was applied.  On deflation the tourniquet remaining fingers pink.  A metacarpal block was given prior to the incisions being made.  This was done with quarter percent bupivacaine without epinephrine approximately 8 cc was used.  Patient was taken to the recovery room for observation in satisfactory condition.  He will be discharged home [facility] he will continue his Keflex antibiotics and is given a prescription for Norco he will try ibuprofen Tylenol but will have the Norco for breakthrough.   Cindee SaltGary Lorean Ekstrand, MD Electronically signed, 04/05/18

## 2018-04-05 NOTE — Anesthesia Procedure Notes (Signed)
Procedure Name: LMA Insertion Date/Time: 04/05/2018 1:21 PM Performed by: [place] DesanctisLinka, Nicanor Mendolia L, CRNA Pre-anesthesia Checklist: Patient identified, Emergency Drugs available, Suction available, Patient being monitored and Timeout performed Patient Re-evaluated:Patient Re-evaluated prior to induction Oxygen Delivery Method: Circle system utilized Preoxygenation: Pre-oxygenation with 100% oxygen Induction Type: IV induction Ventilation: Mask ventilation without difficulty LMA: LMA inserted LMA Size: 4.0 Number of attempts: 1 Airway Equipment and Method: Bite block Placement Confirmation: positive ETCO2 Tube secured with: Tape Dental Injury: Teeth and Oropharynx as per pre-operative assessment

## 2018-04-05 NOTE — Transfer of Care (Signed)
Immediate Anesthesia Transfer of Care Note  Patient: Billy Kelley  Procedure(s) Performed: INCISION AND DRAINAGE LEFT INDEX FINGER (Left Hand)  Patient Location: PACU  Anesthesia Type:General  Level of Consciousness: awake and patient cooperative  Airway & Oxygen Therapy: Patient Spontanous Breathing and Patient connected to face mask oxygen  Post-op Assessment: Report given to RN and Post -op Vital signs reviewed and stable  Post vital signs: Reviewed and stable  Last Vitals:  Vitals Value Taken Time  BP    Temp    Pulse    Resp    SpO2      Last Pain:  Vitals:   04/05/18 1256  PainSc: 8       Patients Stated Pain Goal: 1 (04/05/18 1256)  Complications: No apparent anesthesia complications

## 2018-04-05 NOTE — H&P (Addendum)
Billy Kelley is an 42 y.o. male.   Chief Complaint: Infection left index finger HPI: Cat is a 1 year old right-hand-dominant male who sustained a puncture wound to his left index finger while fishing approximately 5 days ago.  He noticed discoloration following day went to [facility] [address] was placed on an antibiotic this did not improve.  He was given a prescription for Keflex he had continued swelling was seen in the hospital emergency room on 04/03/2018 x-rays were taken which were negative.  He was I&D at that time plan.  He was placed on Levaquin and clindamycin.  He was given a tetanus toxoid.  He continues to complain of pain and swelling of the tip of his left index finger.  He has no prior history of injuries.  He has a history of a C5 fracture with subsequent right-sided hemiparesis following a fall from a roof.  Lanes of a aching pain sharp in nature around the entire finger both dorsally and palmarly.  Has mobility of the joint without significant pain or discomfort.  No proximal swelling at the present time. He has had closure and reaccumulation of swelling and pain.  Past Medical History:  Diagnosis Date  . Kidney stone 2012   passed with fluids   . 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 BRAIN SURGERY    . HERNIA REPAIR Bilateral 1976  . spinal cord fusion  2005    C4-C6     Family History  Problem Relation Age of Onset  . Hypertension Mother   . Hyperlipidemia Mother   . Cancer Mother 68       endometrial   . Heart disease Father        CHF   . Diabetes Neg Hx    Social History:  reports that he quit smoking about 21 months ago. His smoking use included cigarettes. He has a 10.00 pack-year smoking history. He has never used smokeless tobacco. He reports that he has current or past drug history. Drug: Marijuana. He reports that  he does not drink alcohol.  Allergies: Not on File  Medications Prior to Admission  Medication Sig Dispense Refill  . baclofen (LIORESAL) 20 MG tablet TAKE 1 TABLET BY MOUTH FOUR TIMES DAILY 360 tablet 0  . clindamycin (CLEOCIN) 300 MG capsule Take 1 capsule (300 mg total) by mouth 4 (four) times daily for 7 days. 28 capsule 0  . clonazePAM (KLONOPIN) 0.5 MG tablet Take 1 tablet (0.5 mg total) by mouth 3 (three) times daily as needed. 90 tablet 2  . FLUoxetine (PROZAC) 20 MG capsule TAKE 3 CAPSULES(60 MG) BY MOUTH DAILY 270 capsule 3  . gabapentin (NEURONTIN) 600 MG tablet TAKE 1 TABLET(600 MG) BY MOUTH FIVE TIMES DAILY 120 tablet 1  . clonazePAM (KLONOPIN) 0.5 MG tablet TAKE 1 TABLET(0.5 MG) BY MOUTH THREE TIMES DAILY AS NEEDED 90 tablet 3  . gabapentin (NEURONTIN) 600 MG tablet Take 1 tablet (600 mg total) by mouth 5 (five) times daily. 150 tablet 5  . levofloxacin (LEVAQUIN) 750 MG tablet Take 1 tablet (750 mg total) by mouth daily for 7 days. 7 tablet 0  . sildenafil (VIAGRA) 100 MG tablet TAKE 1/2 TO 1 TABLET BY MOUTH DAILY AS NEEDED FOR ERECTILE DYSFUNCTION 5 tablet 0    No results found for this or any previous visit (  from the past 48 hour(s)).  No results found.   Pertinent items are noted in HPI.  There were no vitals taken for this visit.  General appearance: alert, cooperative and appears stated age Head: Normocephalic, without obvious abnormality Neck: no JVD Resp: clear to auscultation bilaterally Cardio: regular rate and rhythm, S1, S2 normal, no murmur, click, rub or gallop GI: soft, non-tender; bowel sounds normal; no masses,  no organomegaly Extremities: Swelling erythema left index finger dorsal dorsally and in the pulp there is no Kanavel sign. Pulses: 2+ and symmetric Skin: Skin color, texture, turgor normal. No rashes or lesions Neurologic: Grossly normal Incision/Wound: Open  Assessment/Plan   Impression: Paronychia felon left index finger possible  joint involvment  Plan incision and drainage with cultures including joint.  Cindee SaltGary Glorianna Gott 04/05/2018, 12:51 PM

## 2018-04-05 NOTE — Anesthesia Postprocedure Evaluation (Signed)
Anesthesia Post Note  Patient: Billy BuggyScott Kelley  Procedure(s) Performed: INCISION AND DRAINAGE LEFT INDEX FINGER (Left Hand)     Patient location during evaluation: PACU Anesthesia Type: General Level of consciousness: awake and alert Pain management: pain level controlled Vital Signs Assessment: post-procedure vital signs reviewed and stable Respiratory status: spontaneous breathing, nonlabored ventilation, respiratory function stable and patient connected to nasal cannula oxygen Cardiovascular status: blood pressure returned to baseline and stable Postop Assessment: no apparent nausea or vomiting Anesthetic complications: no    Last Vitals:  Vitals:   04/05/18 1415 04/05/18 1443  BP: 110/83 111/78  Pulse: 81 76  Resp: 11 18  Temp:  36.5 C  SpO2: 96% 96%    Last Pain:  Vitals:   04/05/18 1443  PainSc: 0-No pain                 Kru Allman L Leesa Leifheit

## 2018-04-05 NOTE — Brief Op Note (Signed)
04/05/2018  1:44 PM  PATIENT:  Billy Kelley  42 y.o. male  PRE-OPERATIVE DIAGNOSIS:  INFECTION LEFT INDEX FINGER  POST-OPERATIVE DIAGNOSIS:  INFECTION LEFT INDEX FINGER  PROCEDURE:  Procedure(s): INCISION AND DRAINAGE LEFT INDEX FINGER (Left)  SURGEON:  Surgeon(s) and Role:    Cindee Salt* Momoko Slezak, MD - Primary  PHYSICIAN ASSISTANT:   ASSISTANTS: none   ANESTHESIA:   local and general  EBL:  3 mL   BLOOD ADMINISTERED:none  DRAINS: Iodoform packing finger   LOCAL MEDICATIONS USED:  BUPIVICAINE   SPECIMEN:  Source of Specimen:  Wound index finger cultures fungal and aerobic anaerobic acid-fast  DISPOSITION OF SPECIMEN:  PATHOLOGY  COUNTS:  YES  TOURNIQUET:  * Missing tourniquet times found for documented tourniquets in log: [id number] *  DICTATION: .Reubin Milanragon Dictation  PLAN OF CARE: Discharge to home after PACU  PATIENT DISPOSITION:  PACU - hemodynamically stable.

## 2018-04-05 NOTE — Discharge Instructions (Addendum)

## 2018-04-05 NOTE — Anesthesia Preprocedure Evaluation (Addendum)
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  Neuromuscular disease    GI/Hepatic negative GI ROS, Neg liver ROS,   Endo/Other  negative endocrine ROS  Renal/GU negative Renal ROS  negative genitourinary   Musculoskeletal negative musculoskeletal ROS (+)   Abdominal   Peds  Hematology negative hematology ROS (+)   Anesthesia Other Findings   Reproductive/Obstetrics                           Anesthesia Physical Anesthesia Plan  ASA: III  Anesthesia Plan: General   Post-op Pain Management:    Induction: Intravenous  PONV Risk Score and Plan: 2 and Dexamethasone, Ondansetron and Midazolam  Airway Management Planned: LMA  Additional Equipment:   Intra-op Plan:   Post-operative Plan: Extubation in OR  Informed Consent: I have reviewed the patients History and Physical, chart, labs and discussed the procedure including the risks, benefits and alternatives for the proposed anesthesia with the patient or authorized representative who has indicated his/her understanding and acceptance.   Dental advisory given  Plan Discussed with: CRNA  Anesthesia Plan Comments:         Anesthesia Quick Evaluation

## 2018-04-06 ENCOUNTER — Encounter (HOSPITAL_BASED_OUTPATIENT_CLINIC_OR_DEPARTMENT_OTHER): Payer: Self-pay | Admitting: Orthopedic Surgery

## 2018-04-06 ENCOUNTER — Encounter: Payer: Worker's Compensation | Admitting: Physical Medicine & Rehabilitation

## 2018-04-06 LAB — ACID FAST SMEAR (AFB, MYCOBACTERIA): Acid Fast Smear: NEGATIVE

## 2018-04-07 NOTE — Addendum Note (Signed)
Addendum  created 04/07/18 1610 by Lance Coon, CRNA   Charge Capture section accepted

## 2018-04-08 DIAGNOSIS — T148XXA Other injury of unspecified body region, initial encounter: Secondary | ICD-10-CM | POA: Diagnosis not present

## 2018-04-10 LAB — AEROBIC/ANAEROBIC CULTURE W GRAM STAIN (SURGICAL/DEEP WOUND)

## 2018-04-10 LAB — AEROBIC/ANAEROBIC CULTURE (SURGICAL/DEEP WOUND)

## 2018-04-11 DIAGNOSIS — T148XXA Other injury of unspecified body region, initial encounter: Secondary | ICD-10-CM | POA: Diagnosis not present

## 2018-04-11 DIAGNOSIS — M79645 Pain in left finger(s): Secondary | ICD-10-CM | POA: Diagnosis not present

## 2018-04-11 DIAGNOSIS — M25642 Stiffness of left hand, not elsewhere classified: Secondary | ICD-10-CM | POA: Diagnosis not present

## 2018-04-11 DIAGNOSIS — L03012 Cellulitis of left finger: Secondary | ICD-10-CM | POA: Diagnosis not present

## 2018-04-12 ENCOUNTER — Other Ambulatory Visit: Payer: Self-pay

## 2018-04-12 ENCOUNTER — Encounter
Payer: Worker's Compensation | Attending: Physical Medicine & Rehabilitation | Admitting: Physical Medicine & Rehabilitation

## 2018-04-12 ENCOUNTER — Encounter: Payer: Self-pay | Admitting: Physical Medicine & Rehabilitation

## 2018-04-12 VITALS — BP 102/62 | HR 84 | Ht 73.0 in | Wt 168.0 lb

## 2018-04-12 DIAGNOSIS — K589 Irritable bowel syndrome without diarrhea: Secondary | ICD-10-CM | POA: Diagnosis not present

## 2018-04-12 DIAGNOSIS — F418 Other specified anxiety disorders: Secondary | ICD-10-CM | POA: Insufficient documentation

## 2018-04-12 DIAGNOSIS — K592 Neurogenic bowel, not elsewhere classified: Secondary | ICD-10-CM | POA: Diagnosis not present

## 2018-04-12 DIAGNOSIS — Z87828 Personal history of other (healed) physical injury and trauma: Secondary | ICD-10-CM | POA: Insufficient documentation

## 2018-04-12 DIAGNOSIS — M545 Low back pain: Secondary | ICD-10-CM | POA: Insufficient documentation

## 2018-04-12 DIAGNOSIS — S14145S Brown-Sequard syndrome at C5 level of cervical spinal cord, sequela: Secondary | ICD-10-CM | POA: Diagnosis not present

## 2018-04-12 DIAGNOSIS — Z8249 Family history of ischemic heart disease and other diseases of the circulatory system: Secondary | ICD-10-CM | POA: Diagnosis not present

## 2018-04-12 DIAGNOSIS — Z79899 Other long term (current) drug therapy: Secondary | ICD-10-CM | POA: Diagnosis not present

## 2018-04-12 DIAGNOSIS — Z87891 Personal history of nicotine dependence: Secondary | ICD-10-CM | POA: Insufficient documentation

## 2018-04-12 DIAGNOSIS — N529 Male erectile dysfunction, unspecified: Secondary | ICD-10-CM | POA: Diagnosis not present

## 2018-04-12 DIAGNOSIS — S61201D Unspecified open wound of left index finger without damage to nail, subsequent encounter: Secondary | ICD-10-CM | POA: Diagnosis not present

## 2018-04-12 DIAGNOSIS — M5136 Other intervertebral disc degeneration, lumbar region: Secondary | ICD-10-CM | POA: Diagnosis not present

## 2018-04-12 DIAGNOSIS — M47816 Spondylosis without myelopathy or radiculopathy, lumbar region: Secondary | ICD-10-CM | POA: Diagnosis not present

## 2018-04-12 DIAGNOSIS — S12400D Unspecified displaced fracture of fifth cervical vertebra, subsequent encounter for fracture with routine healing: Secondary | ICD-10-CM | POA: Diagnosis not present

## 2018-04-12 DIAGNOSIS — G8111 Spastic hemiplegia affecting right dominant side: Secondary | ICD-10-CM | POA: Diagnosis not present

## 2018-04-12 DIAGNOSIS — Z87442 Personal history of urinary calculi: Secondary | ICD-10-CM | POA: Insufficient documentation

## 2018-04-12 DIAGNOSIS — F329 Major depressive disorder, single episode, unspecified: Secondary | ICD-10-CM | POA: Diagnosis not present

## 2018-04-12 DIAGNOSIS — X58XXXD Exposure to other specified factors, subsequent encounter: Secondary | ICD-10-CM | POA: Insufficient documentation

## 2018-04-12 MED ORDER — SILDENAFIL CITRATE 100 MG PO TABS: ORAL_TABLET | ORAL | 2 refills | Status: AC

## 2018-04-12 NOTE — Progress Notes (Signed)
Subjective:    Patient ID: Billy Kelley, male    DOB: 10/31/1975, 42 y.o.   MRN: [id number]  HPI   Billy Kelley is here in follow up of his spastic right hemiparesis. He hooked his left index finger fishing and came down with a strep infection in his left finger. He has had substantial wound care and abx. He is hopeful that he won't lose the finger.    He complains of increasing low back pain, affecting more the right side than left.  I reviewed his lumbar films from 12/19/14 Vertebral alignment is normal. Small Schmorl's node deformity is noted at L4-5. Pars defects are questioned at L5. There is very mild disc space narrowing and endplate spurring at L4-5 and L5-S1. No lytic or blastic osseous lesion is seen. Calcified phleboliths are noted in the pelvis.  He has seen GI regarding his ongoing bowel spasms and loose stool. They had suggested that his back may be related to his abdominal symptoms. Previously, while Billy Kelley has described his abdominal symptoms, he reported abdominal spasms and cramping. He feels that he also has back pain at times when he attempts to have a BM.  He is still not wearing his right AFO due to bulk, and asks if a knee brace might help.   Pain Inventory Average Pain 5 Pain Right Now 5 My pain is constant, stabbing and aching  In the last 24 hours, has pain interfered with the following? General activity 4 Relation with others 4 Enjoyment of life 4 What TIME of day is your pain at its worst? morning evening and night Sleep (in general) Fair  Pain is worse with: walking and standing Pain improves with: rest, medication and injections Relief from Meds: 5  Mobility Do you have any goals in this area?  yes  Function disabled: date disabled n/a Do you have any goals in this area?  yes  Neuro/Psych bowel control problems weakness tingling trouble walking spasms depression anxiety  Prior Studies Any changes since last visit?   yes x-rays  Physicians involved in your care Any changes since last visit?  no   Family History  Problem Relation Age of Onset  . Hypertension Mother   . Hyperlipidemia Mother   . Cancer Mother 56       endometrial   . Heart disease Father        CHF   . Diabetes Neg Hx    Social History   Socioeconomic History  . Marital status: Single    Spouse name: Not on file  . Number of children: 1   . Years of education: Not on file  . Highest education level: Not on file  Occupational History  . Occupation: Disability     Comment: SSI   Social Needs  . Financial resource strain: Not on file  . Food insecurity:    Worry: Not on file    Inability: Not on file  . Transportation needs:    Medical: Not on file    Non-medical: Not on file  Tobacco Use  . Smoking status: Former Smoker    Packs/day: 0.50    Years: 20.00    Pack years: 10.00    Types: Cigarettes    Last attempt to quit: 06/14/2016    Years since quitting: 1.8  . Smokeless tobacco: Never Used  Substance and Sexual Activity  . Alcohol use: No  . Drug use: Yes    Types: Marijuana    Comment: 2-3 times week   .  Sexual activity: Yes    Birth control/protection: Condom  Lifestyle  . Physical activity:    Days per week: Not on file    Minutes per session: Not on file  . Stress: Not on file  Relationships  . Social connections:    Talks on phone: Not on file    Gets together: Not on file    Attends religious service: Not on file    Active member of club or organization: Not on file    Attends meetings of clubs or organizations: Not on file    Relationship status: Not on file  Other Topics Concern  . Not on file  Social History Narrative   Lives with mom.   Moved from [place] most recently. Also live in [place] in [place].          Past Surgical History:  Procedure Laterality Date  . BRAIN SURGERY    . HERNIA REPAIR Bilateral 1976  . INCISION AND DRAINAGE ABSCESS Left 04/05/2018   Procedure:  INCISION AND DRAINAGE LEFT INDEX FINGER;  Surgeon: Cindee Salt, MD;  Location: [facility];  Service: Orthopedics;  Laterality: Left;  . spinal cord fusion  2005    C4-C6    Past Medical History:  Diagnosis Date  . Kidney stone 2012   passed with fluids   . 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, [employer] representative, Statistician, shattered C5 and fused C4-C6    BP 102/62   Pulse 84   Ht 6\' 1"  (1.854 m)   Wt 168 lb (76.2 kg)   SpO2 93%   BMI 22.16 kg/m   Opioid Risk Score:   Fall Risk Score:  `1  Depression screen PHQ 2/9  Depression screen [facility] 2/9 04/12/2018 08/02/2017 08/26/2016 06/01/2016 04/01/2016 10/23/2015 09/30/2015  Decreased Interest 0 0 2 2 2 2 1   Down, Depressed, Hopeless 0 0 0 2 2 2 1   PHQ - 2 Score 0 0 2 4 4 4 2   Altered sleeping - - 0 - - 1 -  Tired, decreased energy - - 0 - - 3 -  Change in appetite - - 0 - - 0 -  Feeling bad or failure about yourself  - - 0 - - 1 -  Trouble concentrating - - 0 - - 3 -  Moving slowly or fidgety/restless - - 0 - - 0 -  Suicidal thoughts - - 0 - - 0 -  PHQ-9 Score - - 2 - - 12 -  Difficult doing work/chores - - - - - Very difficult -    Review of Systems  Constitutional: Negative.   HENT: Negative.   Eyes: Negative.   Respiratory: Negative.   Cardiovascular: Negative.   Gastrointestinal: Negative.   Endocrine: Negative.   Genitourinary: Negative.   Musculoskeletal: Negative.   Skin: Negative.   Allergic/Immunologic: Negative.   Neurological: Negative.   Hematological: Negative.   Psychiatric/Behavioral: Negative.   All other systems reviewed and are negative.      Objective:   Physical Exam  General: No acute distress HEENT: EOMI, oral membranes moist Cards: reg rate  Chest: normal effort Abdomen: Soft, NT, ND Skin: left index finger dressed Extremities: no edema Neuro:Alert and oriented x3. CN exam intact. 3+ to 4/5 deltoid, bicep. 2-3/5 wrist and elbow extension, HI, 2/5  grip.Finger extensors remain very weak. Finger fexor tone RUE1/4,   LT,PP 1+/2 RUE. LUE nearly 5/5 prox to distal. Normal  sensory LUE. RLE: 2 HF, HE, 2- KE and KF, trace ADF/eversion, absent PF. Tone at ankle remains 1/4. Fluctuating tone knee at tr to 1/4. Pt's LLE: trace to 0/2 sensation. Strength grossly 4 to 4+/5. walks with steppage gait bilaterally (not using AFO) Musculoskeletal:  LB  TTP. Has tenderness along the belt line right more than left. Right lumbar paraspinals are quite taut, right hemipelvis is slightly elevated over the left. Psych:Pt's affect is appropriate   Assessment & Plan:  1. C5 fx/SCI with Billy Kelley Presentation  2. Spastic right hemiparesis  3. Neurogenic bowel  4. Reactive depression with anxiety, ?PTSD 5. Neuropathic pain due to #1 6. Strep infected left index finger wound 7. Increased low back pain, hx of lumbar spondylosis, worsened by chronic altered gait mechanics.     Plan:  1.Bowel spasms per GI 2.He is non-narcotic from this office. Narcotic per surgical providers.  3. Further botox in a month to his RUE finger and wrist flexors, 300u 4.Continue  Gabapentin  600mg  BID and 1800mg  qhs 5. Reviewed importance of gait mechanics, posture. We have done this on numerous occasions -AFO would be most ideal for gait (he still is not using currently).  -he may try an elastic knee sleeve to help with support and to provide propioceptive feedback to the right knee --will order an MRI of his lumbar spine to assess his lower lumbar segments given his increasing pain  6. Continue baclofen for now20mg  QID. klonopin to 0.5mg  q8.  7. Wound care per hand surgery ongoing.  8. viagra rx was filled today as he has ongoing erectile dysfunction.     of face to face patient care time were spent during this visit. All questions were encouraged and answered.

## 2018-04-12 NOTE — Patient Instructions (Signed)
PLEASE FEEL FREE TO CALL OUR OFFICE WITH ANY PROBLEMS OR QUESTIONS ([phone])      

## 2018-04-14 ENCOUNTER — Telehealth: Payer: Self-pay | Admitting: Physical Medicine & Rehabilitation

## 2018-04-14 NOTE — Telephone Encounter (Signed)
Appeal it   WC refuses just about everything on this guy. The back problem is secondary to his altered gait patterns which were caused by his cervical spinal cord injury.

## 2018-04-14 NOTE — Telephone Encounter (Signed)
Workers comp has denied the request/order for Lumbar spine MRI. They stated that only "neck "issues are covered on this claim.  Please advise on how you want this handled.  Thanks, Rosezella [place].

## 2018-04-15 DIAGNOSIS — M25642 Stiffness of left hand, not elsewhere classified: Secondary | ICD-10-CM | POA: Diagnosis not present

## 2018-04-15 DIAGNOSIS — L03012 Cellulitis of left finger: Secondary | ICD-10-CM | POA: Diagnosis not present

## 2018-04-15 DIAGNOSIS — T148XXA Other injury of unspecified body region, initial encounter: Secondary | ICD-10-CM | POA: Diagnosis not present

## 2018-04-15 DIAGNOSIS — M79645 Pain in left finger(s): Secondary | ICD-10-CM | POA: Diagnosis not present

## 2018-04-18 ENCOUNTER — Other Ambulatory Visit: Payer: Self-pay

## 2018-04-18 ENCOUNTER — Other Ambulatory Visit: Payer: Self-pay | Admitting: Orthopedic Surgery

## 2018-04-18 ENCOUNTER — Encounter (HOSPITAL_BASED_OUTPATIENT_CLINIC_OR_DEPARTMENT_OTHER): Payer: Self-pay | Admitting: *Deleted

## 2018-04-18 DIAGNOSIS — L03012 Cellulitis of left finger: Secondary | ICD-10-CM | POA: Diagnosis not present

## 2018-04-18 NOTE — Anesthesia Preprocedure Evaluation (Addendum)
Anesthesia Evaluation  Patient identified by MRN, date of birth, ID band Patient awake    Reviewed: Allergy & Precautions, NPO status , Patient's Chart, lab work & pertinent test results  Airway Mallampati: II  TM Distance: >3 FB Neck ROM: Full    Dental no notable dental hx. (+) Teeth Intact, Dental Advisory Given   Pulmonary neg pulmonary ROS, former smoker,    Pulmonary exam normal breath sounds clear to auscultation       Cardiovascular negative cardio ROS Normal cardiovascular exam Rhythm:Regular Rate:Normal     Neuro/Psych Anxiety    GI/Hepatic negative GI ROS,   Endo/Other    Renal/GU      Musculoskeletal  (+) Arthritis ,   Abdominal   Peds  Hematology   Anesthesia Other Findings   Reproductive/Obstetrics                            Anesthesia Physical Anesthesia Plan  ASA: II  Anesthesia Plan: Bier Block and Bier Block-LIDOCAINE ONLY   Post-op Pain Management:    Induction:   PONV Risk Score and Plan: Treatment may vary due to age or medical condition  Airway Management Planned: Nasal Cannula and Natural Airway  Additional Equipment:   Intra-op Plan:   Post-operative Plan:   Informed Consent: I have reviewed the patients History and Physical, chart, labs and discussed the procedure including the risks, benefits and alternatives for the proposed anesthesia with the patient or authorized representative who has indicated his/her understanding and acceptance.   Dental advisory given  Plan Discussed with: CRNA  Anesthesia Plan Comments:        Anesthesia Quick Evaluation

## 2018-04-19 ENCOUNTER — Ambulatory Visit (HOSPITAL_BASED_OUTPATIENT_CLINIC_OR_DEPARTMENT_OTHER): Payer: Medicare HMO | Admitting: Anesthesiology

## 2018-04-19 ENCOUNTER — Encounter (HOSPITAL_BASED_OUTPATIENT_CLINIC_OR_DEPARTMENT_OTHER)
Admission: RE | Disposition: A | Discharge status: Home / Self Care | Payer: Self-pay | Source: Ambulatory Visit | Attending: Orthopedic Surgery

## 2018-04-19 ENCOUNTER — Other Ambulatory Visit: Payer: Self-pay

## 2018-04-19 ENCOUNTER — Ambulatory Visit (HOSPITAL_BASED_OUTPATIENT_CLINIC_OR_DEPARTMENT_OTHER)
Admission: RE | Admit: 2018-04-19 | Discharge: 2018-04-19 | Disposition: A | Discharge status: Home / Self Care | Payer: Medicare HMO | Source: Ambulatory Visit | Attending: Orthopedic Surgery | Admitting: Orthopedic Surgery

## 2018-04-19 ENCOUNTER — Encounter (HOSPITAL_BASED_OUTPATIENT_CLINIC_OR_DEPARTMENT_OTHER): Payer: Self-pay

## 2018-04-19 DIAGNOSIS — Z792 Long term (current) use of antibiotics: Secondary | ICD-10-CM | POA: Diagnosis not present

## 2018-04-19 DIAGNOSIS — Z79899 Other long term (current) drug therapy: Secondary | ICD-10-CM | POA: Diagnosis not present

## 2018-04-19 DIAGNOSIS — Z981 Arthrodesis status: Secondary | ICD-10-CM | POA: Insufficient documentation

## 2018-04-19 DIAGNOSIS — L03012 Cellulitis of left finger: Secondary | ICD-10-CM | POA: Diagnosis not present

## 2018-04-19 DIAGNOSIS — Z87442 Personal history of urinary calculi: Secondary | ICD-10-CM | POA: Insufficient documentation

## 2018-04-19 DIAGNOSIS — Z8249 Family history of ischemic heart disease and other diseases of the circulatory system: Secondary | ICD-10-CM | POA: Insufficient documentation

## 2018-04-19 DIAGNOSIS — Z87891 Personal history of nicotine dependence: Secondary | ICD-10-CM | POA: Insufficient documentation

## 2018-04-19 DIAGNOSIS — L0889 Other specified local infections of the skin and subcutaneous tissue: Secondary | ICD-10-CM | POA: Diagnosis not present

## 2018-04-19 DIAGNOSIS — F419 Anxiety disorder, unspecified: Secondary | ICD-10-CM | POA: Diagnosis not present

## 2018-04-19 DIAGNOSIS — Z8049 Family history of malignant neoplasm of other genital organs: Secondary | ICD-10-CM | POA: Insufficient documentation

## 2018-04-19 DIAGNOSIS — L089 Local infection of the skin and subcutaneous tissue, unspecified: Secondary | ICD-10-CM | POA: Diagnosis not present

## 2018-04-19 HISTORY — PX: INCISION AND DRAINAGE: SHX5863

## 2018-04-19 SURGERY — INCISION AND DRAINAGE
Anesthesia: Regional | Site: Hand | Laterality: Left

## 2018-04-19 MED ORDER — ACETAMINOPHEN 500 MG PO TABS
ORAL_TABLET | ORAL | Status: AC
  Filled 2018-04-19: qty 2

## 2018-04-19 MED ORDER — HYDROCODONE-ACETAMINOPHEN 5-325 MG PO TABS: 1.0000 | ORAL_TABLET | Freq: Four times a day (QID) | ORAL | 0 refills | Status: DC | PRN

## 2018-04-19 MED ORDER — PROMETHAZINE HCL 25 MG/ML IJ SOLN: 6.2500 mg | INTRAMUSCULAR | Status: DC | PRN

## 2018-04-19 MED ORDER — MEPERIDINE HCL 25 MG/ML IJ SOLN: 6.2500 mg | INTRAMUSCULAR | Status: DC | PRN

## 2018-04-19 MED ORDER — ACETAMINOPHEN 500 MG PO TABS
1000.0000 mg | ORAL_TABLET | Freq: Once | ORAL | Status: AC
  Administered 2018-04-19: 1000 mg via ORAL

## 2018-04-19 MED ORDER — CEFAZOLIN SODIUM 1 G IJ SOLR
INTRAMUSCULAR | Status: AC
  Filled 2018-04-19: qty 20

## 2018-04-19 MED ORDER — HYDROCODONE-ACETAMINOPHEN 7.5-325 MG PO TABS: 1.0000 | ORAL_TABLET | Freq: Once | ORAL | Status: DC | PRN

## 2018-04-19 MED ORDER — FENTANYL CITRATE (PF) 100 MCG/2ML IJ SOLN
INTRAMUSCULAR | Status: AC
  Filled 2018-04-19: qty 2

## 2018-04-19 MED ORDER — CEFAZOLIN SODIUM-DEXTROSE 2-3 GM-%(50ML) IV SOLR
INTRAVENOUS | Status: DC | PRN
  Administered 2018-04-19: 2 g via INTRAVENOUS

## 2018-04-19 MED ORDER — KETOROLAC TROMETHAMINE 30 MG/ML IJ SOLN: 30.0000 mg | Freq: Once | INTRAMUSCULAR | Status: DC | PRN

## 2018-04-19 MED ORDER — MIDAZOLAM HCL 2 MG/2ML IJ SOLN
INTRAMUSCULAR | Status: AC
  Filled 2018-04-19: qty 2

## 2018-04-19 MED ORDER — SCOPOLAMINE 1 MG/3DAYS TD PT72: 1.0000 | MEDICATED_PATCH | Freq: Once | TRANSDERMAL | Status: DC | PRN

## 2018-04-19 MED ORDER — LACTATED RINGERS IV SOLN
INTRAVENOUS | Status: DC
  Administered 2018-04-19: 08:00:00 via INTRAVENOUS

## 2018-04-19 MED ORDER — LIDOCAINE 2% (20 MG/ML) 5 ML SYRINGE
INTRAMUSCULAR | Status: AC
  Filled 2018-04-19: qty 5

## 2018-04-19 MED ORDER — FENTANYL CITRATE (PF) 100 MCG/2ML IJ SOLN: 25.0000 ug | INTRAMUSCULAR | Status: DC | PRN

## 2018-04-19 MED ORDER — CHLORHEXIDINE GLUCONATE 4 % EX LIQD: 60.0000 mL | Freq: Once | CUTANEOUS | Status: DC

## 2018-04-19 MED ORDER — BUPIVACAINE HCL (PF) 0.25 % IJ SOLN
INTRAMUSCULAR | Status: AC
  Filled 2018-04-19: qty 30

## 2018-04-19 MED ORDER — BUPIVACAINE HCL (PF) 0.25 % IJ SOLN
INTRAMUSCULAR | Status: DC | PRN
  Administered 2018-04-19: 7 mL

## 2018-04-19 MED ORDER — PROPOFOL 500 MG/50ML IV EMUL
INTRAVENOUS | Status: DC | PRN
  Administered 2018-04-19: 200 ug/kg/min via INTRAVENOUS

## 2018-04-19 MED ORDER — ONDANSETRON HCL 4 MG/2ML IJ SOLN
INTRAMUSCULAR | Status: DC | PRN
  Administered 2018-04-19: 4 mg via INTRAVENOUS

## 2018-04-19 MED ORDER — FENTANYL CITRATE (PF) 100 MCG/2ML IJ SOLN
50.0000 ug | INTRAMUSCULAR | Status: DC | PRN
  Administered 2018-04-19 (×2): 50 ug via INTRAVENOUS

## 2018-04-19 MED ORDER — LIDOCAINE HCL (CARDIAC) PF 100 MG/5ML IV SOSY
PREFILLED_SYRINGE | INTRAVENOUS | Status: DC | PRN
  Administered 2018-04-19: 50 mg via INTRAVENOUS

## 2018-04-19 MED ORDER — MIDAZOLAM HCL 2 MG/2ML IJ SOLN
1.0000 mg | INTRAMUSCULAR | Status: DC | PRN
  Administered 2018-04-19: 1 mg via INTRAVENOUS
  Administered 2018-04-19: 2 mg via INTRAVENOUS
  Administered 2018-04-19: 1 mg via INTRAVENOUS

## 2018-04-19 MED ORDER — SULFAMETHOXAZOLE-TRIMETHOPRIM 800-160 MG PO TABS: 1.0000 | ORAL_TABLET | Freq: Two times a day (BID) | ORAL | 0 refills | Status: DC

## 2018-04-19 SURGICAL SUPPLY — 44 items
BAG DECANTER FOR FLEXI CONT (MISCELLANEOUS) IMPLANT
BLADE MINI RND TIP GREEN BEAV (BLADE) IMPLANT
BLADE SURG 15 STRL LF DISP TIS (BLADE) ×1 IMPLANT
BLADE SURG 15 STRL SS (BLADE) ×2
BNDG COHESIVE 1X5 TAN STRL LF (GAUZE/BANDAGES/DRESSINGS) ×3 IMPLANT
BNDG COHESIVE 3X5 TAN STRL LF (GAUZE/BANDAGES/DRESSINGS) IMPLANT
BNDG ESMARK 4X9 LF (GAUZE/BANDAGES/DRESSINGS) IMPLANT
BNDG GAUZE ELAST 4 BULKY (GAUZE/BANDAGES/DRESSINGS) IMPLANT
CHLORAPREP W/TINT 26ML (MISCELLANEOUS) ×3 IMPLANT
CORD BIPOLAR FORCEPS 12FT (ELECTRODE) ×3 IMPLANT
COVER BACK TABLE 60X90IN (DRAPES) ×3 IMPLANT
COVER MAYO STAND STRL (DRAPES) ×3 IMPLANT
COVER WAND RF STERILE (DRAPES) ×3 IMPLANT
CUFF TOURNIQUET SINGLE 18IN (TOURNIQUET CUFF) ×3 IMPLANT
DRAPE EXTREMITY T 121X128X90 (DRAPE) ×3 IMPLANT
DRAPE SURG 17X23 STRL (DRAPES) ×3 IMPLANT
GAUZE PACKING IODOFORM 1/4X15 (GAUZE/BANDAGES/DRESSINGS) ×3 IMPLANT
GAUZE SPONGE 4X4 12PLY STRL (GAUZE/BANDAGES/DRESSINGS) ×3 IMPLANT
GAUZE XEROFORM 1X8 LF (GAUZE/BANDAGES/DRESSINGS) ×3 IMPLANT
GLOVE BIOGEL PI IND STRL 8.5 (GLOVE) ×1 IMPLANT
GLOVE BIOGEL PI INDICATOR 8.5 (GLOVE) ×2
GLOVE SURG ORTHO 8.0 STRL STRW (GLOVE) ×3 IMPLANT
GOWN STRL REUS W/ TWL LRG LVL3 (GOWN DISPOSABLE) ×1 IMPLANT
GOWN STRL REUS W/TWL LRG LVL3 (GOWN DISPOSABLE) ×2
GOWN STRL REUS W/TWL XL LVL3 (GOWN DISPOSABLE) ×3 IMPLANT
LOOP VESSEL MAXI BLUE (MISCELLANEOUS) IMPLANT
NEEDLE PRECISIONGLIDE 27X1.5 (NEEDLE) IMPLANT
NS IRRIG 1000ML POUR BTL (IV SOLUTION) ×3 IMPLANT
PACK BASIN DAY SURGERY FS (CUSTOM PROCEDURE TRAY) ×3 IMPLANT
PAD CAST 3X4 CTTN HI CHSV (CAST SUPPLIES) IMPLANT
PADDING CAST ABS 4INX4YD NS (CAST SUPPLIES) ×2
PADDING CAST ABS COTTON 4X4 ST (CAST SUPPLIES) ×1 IMPLANT
PADDING CAST COTTON 3X4 STRL (CAST SUPPLIES)
SPLINT PLASTER CAST XFAST 3X15 (CAST SUPPLIES) IMPLANT
SPLINT PLASTER XTRA FASTSET 3X (CAST SUPPLIES)
STOCKINETTE 4X48 STRL (DRAPES) ×3 IMPLANT
SUT ETHILON 4 0 PS 2 18 (SUTURE) ×3 IMPLANT
SWAB COLLECTION DEVICE MRSA (MISCELLANEOUS) ×6 IMPLANT
SWAB CULTURE ESWAB REG 1ML (MISCELLANEOUS) ×6 IMPLANT
SYR BULB 3OZ (MISCELLANEOUS) ×3 IMPLANT
SYR CONTROL 10ML LL (SYRINGE) IMPLANT
TOWEL GREEN STERILE FF (TOWEL DISPOSABLE) ×6 IMPLANT
TUBE FEEDING ENTERAL 5FR 16IN (TUBING) IMPLANT
UNDERPAD 30X30 (UNDERPADS AND DIAPERS) ×3 IMPLANT

## 2018-04-19 NOTE — Transfer of Care (Signed)
Immediate Anesthesia Transfer of Care Note  Patient: Billy Kelley  Procedure(s) Performed: INCISION AND DRAINAGE LEFT INDEX FINGER (Left Hand)  Patient Location: PACU  Anesthesia Type:MAC and Bier block  Level of Consciousness: awake and alert   Airway & Oxygen Therapy: Patient Spontanous Breathing and Patient connected to face mask oxygen  Post-op Assessment: Report given to RN and Post -op Vital signs reviewed and stable  Post vital signs: Reviewed and stable  Last Vitals:  Vitals Value Taken Time  BP 87/65 04/19/2018  9:05 AM  Temp    Pulse 74 04/19/2018  9:06 AM  Resp 10 04/19/2018  9:06 AM  SpO2 99 % 04/19/2018  9:06 AM  Vitals shown include unvalidated device data.  Last Pain:  Vitals:   04/19/18 0737  TempSrc:   PainSc: 7       Patients Stated Pain Goal: 3 (42/70/62 3762)  Complications: No apparent anesthesia complications

## 2018-04-19 NOTE — Anesthesia Postprocedure Evaluation (Signed)
Anesthesia Post Note  Patient: Billy Kelley  Procedure(s) Performed: INCISION AND DRAINAGE LEFT INDEX FINGER (Left Hand)     Patient location during evaluation: PACU Anesthesia Type: Regional    Last Vitals:  Vitals:   04/19/18 0924 04/19/18 0934  BP: 98/72 102/69  Pulse: 70 70  Resp: 10 12  Temp:  36.6 C  SpO2: 98% 95%    Last Pain:  Vitals:   04/19/18 0934  TempSrc: Oral  PainSc: 0-No pain                 Trevor Iha

## 2018-04-19 NOTE — Op Note (Signed)
NAME: Brylen Wagar MEDICAL RECORD NO: [id number] DATE OF BIRTH: [birth year] FACILITY: [facility] LOCATION: [facility] PHYSICIAN: Nicki Reaper, MD   OPERATIVE REPORT   DATE OF PROCEDURE: 04/19/18    PREOPERATIVE DIAGNOSIS:   Infection left index finger   POSTOPERATIVE DIAGNOSIS:   Same   PROCEDURE:   Incision and drainage debridement of pulp with incision and drainage distal interphalangeal joint with packing   SURGEON: Cindee Salt, M.D.   ASSISTANT: none   ANESTHESIA:  Bier block with sedation and Local   INTRAVENOUS FLUIDS:  Per anesthesia flow sheet.   ESTIMATED BLOOD LOSS:  Minimal.   COMPLICATIONS:  None.   SPECIMENS:   Cultures   TOURNIQUET TIME:    Total Tourniquet Time Documented: Forearm (Left) - 23 minutes Total: Forearm (Left) - 23 minutes    DISPOSITION:  Stable to PACU.   INDICATIONS: Patient is a 42 year old male with a history of a fishing hook stuck in his index finger pulp he underwent incision and drainage of a felon and paronychia this did well but the wound has sealed and he has developed re-swelling with pain in the distal phalangeal joint.  He is admitted for re-incision and drainage of the felon with debridement and incision and drainage of the distal phalangeal joint.  Pre-peri-postoperative course been discussed along with risks and complications.  He is aware that there is no guarantee to the surgery the possibility of continued infection injuries to arteries nerves tendons.  Operative the patient seen extremity marked by both patient and surgeon robotics were held for cultures  OPERATIVE COURSE: Patient brought to the operating room where a forearm-based IV regional anesthetic was carried out without difficulty under the direction of the anesthesia department.  Was prepped using ChloraPrep in the supine position with left arm free.  A three-minute dry time was allowed timeout taken to confirm patient procedure.  A metacarpal block  was given quarter percent bupivacaine without epinephrine approximately 9 cc was used.  A incision was then made on the dorsal radial aspect of the distal phalangeal joint carried down through subcutaneous tissue.  The joint was open the fluid was then cultured for both aerobic and anaerobic cultures.  The this wound was irrigated and packed.  A separate incision was then made over the dorsal aspect of the necrotic area of the pulp of the index finger.  Purulent material was immediately encountered with the necrosis of a portion of the distal pulp.  This was debrided with sharp dissection using scalpel and followed by rondure.  Shoes were taken for both aerobic anaerobic cultures.  Have been taken in the past for mycobacteria and acid-fast.  The wound was copiously irrigated with saline and packed separately.  A sterile compressive dressing and splint to the finger was applied.  Patient tolerated the procedure well was taken to the recovery room for observation in satisfactory condition.  He will be discharged home to return [facility] 3 days on [place] and Septra DS.   Cindee Salt, MD Electronically signed, 04/19/18

## 2018-04-19 NOTE — Discharge Instructions (Signed)
Hand Center Instructions Hand Surgery  Wound Care: Keep your hand elevated above the level of your heart.  Do not allow it to dangle by your side.  Keep the dressing dry and do not remove it unless your doctor advises you to do so.  He will usually change it at the time of your post-op visit.  Moving your fingers is advised to stimulate circulation but will depend on the site of your surgery.  If you have a splint applied, your doctor will advise you regarding movement.  Activity: Do not drive or operate machinery today.  Rest today and then you may return to your normal activity and work as indicated by your physician.  Diet:  Drink liquids today or eat a light diet.  You may resume a regular diet tomorrow.    General expectations: Pain for two to three days. Fingers may become slightly swollen.  Call your doctor if any of the following occur: Severe pain not relieved by pain medication. Elevated temperature. Dressing soaked with blood. Inability to move fingers. White or bluish color to fingers.  Regional Anesthesia Blocks  1. Numbness or the inability to move the "blocked" extremity may last from 3-48 hours after placement. The length of time depends on the medication injected and your individual response to the medication. If the numbness is not going away after 48 hours, call your surgeon.  2. The extremity that is blocked will need to be protected until the numbness is gone and the  Strength has returned. Because you cannot feel it, you will need to take extra care to avoid injury. Because it may be weak, you may have difficulty moving it or using it. You may not know what position it is in without looking at it while the block is in effect.  3. For blocks in the legs and feet, returning to weight bearing and walking needs to be done carefully. You will need to wait until the numbness is entirely gone and the strength has returned. You should be able to move your leg and foot  normally before you try and bear weight or walk. You will need someone to be with you when you first try to ensure you do not fall and possibly risk injury.  4. Bruising and tenderness at the needle site are common side effects and will resolve in a few days.  5. Persistent numbness or new problems with movement should be communicated to the surgeon or the [facility] [phone] [facility] [phone]).   Post Anesthesia Home Care Instructions  Activity: Get plenty of rest for the remainder of the day. A responsible individual must stay with you for 24 hours following the procedure.  For the next 24 hours, DO NOT: -Drive a car -Advertising copywriter -Drink alcoholic beverages -Take any medication unless instructed by your physician -Make any legal decisions or sign important papers.  Meals: Start with liquid foods such as gelatin or soup. Progress to regular foods as tolerated. Avoid greasy, spicy, heavy foods. If nausea and/or vomiting occur, drink only clear liquids until the nausea and/or vomiting subsides. Call your physician if vomiting continues.  Special Instructions/Symptoms: Your throat may feel dry or sore from the anesthesia or the breathing tube placed in your throat during surgery. If this causes discomfort, gargle with warm salt water. The discomfort should disappear within 24 hours.  If you had a scopolamine patch placed behind your ear for the management of post- operative nausea and/or vomiting:  1. The medication in the patch is effective for 72 hours, after which it should be removed.  Wrap patch in a tissue and discard in the trash. Wash hands thoroughly with soap and water. 2. You may remove the patch earlier than 72 hours if you experience unpleasant side effects which may include dry mouth, dizziness or visual disturbances. 3. Avoid touching the patch. Wash your hands with soap and water after contact with the patch.    No Tylenol  until 1:45pm!

## 2018-04-19 NOTE — Anesthesia Procedure Notes (Signed)
Anesthesia Regional Block: Bier block (IV Regional)   Pre-Anesthetic Checklist: ,, timeout performed, Correct Patient, Correct Site, Correct Laterality, Correct Procedure,, site marked, surgical consent,, at surgeon's request  Laterality: Left     Needles:  Injection technique: Single-shot  Needle Type: Other      Needle Gauge: 20     Additional Needles:   Procedures:,,,,, intact distal pulses, Esmarch exsanguination, single tourniquet utilized,  Narrative:  Start time: 04/19/2018 8:36 AM End time: 04/19/2018 8:38 AM Injection made incrementally with aspirations every 35 mL.  Performed by: Personally   Additional Notes: Esmark wrap, torq inflated to 290, slowly injected 35ml 0.5% pres free Lidocaine. Pt tol well. No neg s/s

## 2018-04-19 NOTE — Brief Op Note (Signed)
04/19/2018  8:57 AM  PATIENT:  Billy Kelley  42 y.o. male  PRE-OPERATIVE DIAGNOSIS:  FELON LEFT INDEX FINGER  POST-OPERATIVE DIAGNOSIS:  FELON LEFT INDEX FINGER  PROCEDURE:  Procedure(s): INCISION AND DRAINAGE LEFT INDEX FINGER (Left)  SURGEON:  Surgeon(s) and Role:    * Cindee Salt, MD - Primary  PHYSICIAN ASSISTANT:   ASSISTANTS: none   ANESTHESIA:   local, regional and IV sedation  EBL: 1ml  BLOOD ADMINISTERED:none  DRAINS: Iodoform gauze in the DIP joint and pulp X finger   LOCAL MEDICATIONS USED:  BUPIVICAINE   SPECIMEN:  Source of Specimen:  Cultures  DISPOSITION OF SPECIMEN:  PATHOLOGY  COUNTS:  YES  TOURNIQUET:  * Missing tourniquet times found for documented tourniquets in log: [id number] *  DICTATION: .Dragon Dictation  PLAN OF CARE: Discharge to home after PACU  PATIENT DISPOSITION:  PACU - hemodynamically stable.

## 2018-04-20 ENCOUNTER — Encounter (HOSPITAL_BASED_OUTPATIENT_CLINIC_OR_DEPARTMENT_OTHER): Payer: Self-pay | Admitting: Orthopedic Surgery

## 2018-04-22 DIAGNOSIS — T148XXA Other injury of unspecified body region, initial encounter: Secondary | ICD-10-CM | POA: Diagnosis not present

## 2018-04-22 DIAGNOSIS — M25642 Stiffness of left hand, not elsewhere classified: Secondary | ICD-10-CM | POA: Diagnosis not present

## 2018-04-22 DIAGNOSIS — M79645 Pain in left finger(s): Secondary | ICD-10-CM | POA: Diagnosis not present

## 2018-04-22 DIAGNOSIS — L03012 Cellulitis of left finger: Secondary | ICD-10-CM | POA: Diagnosis not present

## 2018-04-24 LAB — AEROBIC/ANAEROBIC CULTURE W GRAM STAIN (SURGICAL/DEEP WOUND): Culture: NO GROWTH

## 2018-04-24 LAB — AEROBIC/ANAEROBIC CULTURE (SURGICAL/DEEP WOUND): CULTURE: NO GROWTH

## 2018-04-26 DIAGNOSIS — M79645 Pain in left finger(s): Secondary | ICD-10-CM | POA: Diagnosis not present

## 2018-04-26 DIAGNOSIS — L03012 Cellulitis of left finger: Secondary | ICD-10-CM | POA: Diagnosis not present

## 2018-04-26 DIAGNOSIS — T148XXA Other injury of unspecified body region, initial encounter: Secondary | ICD-10-CM | POA: Diagnosis not present

## 2018-04-26 DIAGNOSIS — M25642 Stiffness of left hand, not elsewhere classified: Secondary | ICD-10-CM | POA: Diagnosis not present

## 2018-04-29 DIAGNOSIS — M25642 Stiffness of left hand, not elsewhere classified: Secondary | ICD-10-CM | POA: Diagnosis not present

## 2018-04-29 DIAGNOSIS — L03012 Cellulitis of left finger: Secondary | ICD-10-CM | POA: Diagnosis not present

## 2018-04-29 DIAGNOSIS — T148XXA Other injury of unspecified body region, initial encounter: Secondary | ICD-10-CM | POA: Diagnosis not present

## 2018-04-29 DIAGNOSIS — M79645 Pain in left finger(s): Secondary | ICD-10-CM | POA: Diagnosis not present

## 2018-05-02 DIAGNOSIS — M79645 Pain in left finger(s): Secondary | ICD-10-CM | POA: Diagnosis not present

## 2018-05-02 DIAGNOSIS — M25642 Stiffness of left hand, not elsewhere classified: Secondary | ICD-10-CM | POA: Diagnosis not present

## 2018-05-02 DIAGNOSIS — T148XXA Other injury of unspecified body region, initial encounter: Secondary | ICD-10-CM | POA: Diagnosis not present

## 2018-05-02 DIAGNOSIS — L03012 Cellulitis of left finger: Secondary | ICD-10-CM | POA: Diagnosis not present

## 2018-05-04 DIAGNOSIS — L03012 Cellulitis of left finger: Secondary | ICD-10-CM | POA: Diagnosis not present

## 2018-05-04 DIAGNOSIS — M25642 Stiffness of left hand, not elsewhere classified: Secondary | ICD-10-CM | POA: Diagnosis not present

## 2018-05-04 DIAGNOSIS — M79645 Pain in left finger(s): Secondary | ICD-10-CM | POA: Diagnosis not present

## 2018-05-04 DIAGNOSIS — T148XXA Other injury of unspecified body region, initial encounter: Secondary | ICD-10-CM | POA: Diagnosis not present

## 2018-05-06 DIAGNOSIS — L03012 Cellulitis of left finger: Secondary | ICD-10-CM | POA: Diagnosis not present

## 2018-05-06 DIAGNOSIS — M25642 Stiffness of left hand, not elsewhere classified: Secondary | ICD-10-CM | POA: Diagnosis not present

## 2018-05-06 DIAGNOSIS — T148XXA Other injury of unspecified body region, initial encounter: Secondary | ICD-10-CM | POA: Diagnosis not present

## 2018-05-06 DIAGNOSIS — M79645 Pain in left finger(s): Secondary | ICD-10-CM | POA: Diagnosis not present

## 2018-05-06 LAB — FUNGAL ORGANISM REFLEX

## 2018-05-06 LAB — FUNGUS CULTURE WITH STAIN

## 2018-05-06 LAB — FUNGUS CULTURE RESULT

## 2018-05-09 ENCOUNTER — Other Ambulatory Visit: Payer: Self-pay | Admitting: Physical Medicine & Rehabilitation

## 2018-05-10 ENCOUNTER — Other Ambulatory Visit: Payer: Self-pay | Admitting: Physical Medicine & Rehabilitation

## 2018-05-10 ENCOUNTER — Telehealth: Payer: Self-pay | Admitting: *Deleted

## 2018-05-10 DIAGNOSIS — M5136 Other intervertebral disc degeneration, lumbar region: Secondary | ICD-10-CM

## 2018-05-10 NOTE — Telephone Encounter (Signed)
As long as the surgery didn't involve the area we're injecting and he's not actively infected, it shouldn't be an issue

## 2018-05-10 NOTE — Telephone Encounter (Signed)
Patient left a message stating that he is scheduled to have Botox on the 4th of November. He recently had (undisclosed) surgery two weeks ago. He is still taking antibiotics.  He is asking he can still have his Botox injection.

## 2018-05-10 NOTE — Telephone Encounter (Signed)
Patient notified. Surgery on other side. Medication course completed. No infection

## 2018-05-11 DIAGNOSIS — L03012 Cellulitis of left finger: Secondary | ICD-10-CM | POA: Diagnosis not present

## 2018-05-16 ENCOUNTER — Encounter: Payer: Worker's Compensation | Admitting: Physical Medicine & Rehabilitation

## 2018-05-16 ENCOUNTER — Other Ambulatory Visit: Payer: Self-pay | Admitting: Physical Medicine & Rehabilitation

## 2018-05-17 ENCOUNTER — Other Ambulatory Visit: Payer: Self-pay | Admitting: Physical Medicine & Rehabilitation

## 2018-05-17 ENCOUNTER — Ambulatory Visit
Admission: RE | Admit: 2018-05-17 | Discharge: 2018-05-17 | Disposition: A | Discharge status: Home / Self Care | Payer: Medicare Other | Source: Ambulatory Visit | Attending: Physical Medicine & Rehabilitation | Admitting: Physical Medicine & Rehabilitation

## 2018-05-17 DIAGNOSIS — M48061 Spinal stenosis, lumbar region without neurogenic claudication: Secondary | ICD-10-CM | POA: Diagnosis not present

## 2018-05-17 DIAGNOSIS — M5136 Other intervertebral disc degeneration, lumbar region: Secondary | ICD-10-CM

## 2018-05-18 DIAGNOSIS — R69 Illness, unspecified: Secondary | ICD-10-CM | POA: Diagnosis not present

## 2018-05-19 DIAGNOSIS — F332 Major depressive disorder, recurrent severe without psychotic features: Secondary | ICD-10-CM | POA: Diagnosis not present

## 2018-05-19 DIAGNOSIS — F4312 Post-traumatic stress disorder, chronic: Secondary | ICD-10-CM | POA: Diagnosis not present

## 2018-05-19 DIAGNOSIS — F411 Generalized anxiety disorder: Secondary | ICD-10-CM | POA: Diagnosis not present

## 2018-05-19 DIAGNOSIS — F902 Attention-deficit hyperactivity disorder, combined type: Secondary | ICD-10-CM | POA: Diagnosis not present

## 2018-05-19 LAB — ACID FAST CULTURE WITH REFLEXED SENSITIVITIES (MYCOBACTERIA): Acid Fast Culture: NEGATIVE

## 2018-05-23 ENCOUNTER — Telehealth: Payer: Self-pay

## 2018-05-23 NOTE — Telephone Encounter (Signed)
Nurse from [facility] called the only thing I could understand was MRI and an ID #, telephone and fax #. I called back and some able to get clarification. At time of message MRI was not approved. MRI has now been approved. Autho# [id number] good from 05-17-18 to 06-16-18.  WU#[id number] Phone # [phone] Fax # [phone]

## 2018-05-25 ENCOUNTER — Encounter: Payer: Self-pay | Admitting: Physical Medicine & Rehabilitation

## 2018-05-25 ENCOUNTER — Encounter
Payer: Worker's Compensation | Attending: Physical Medicine & Rehabilitation | Admitting: Physical Medicine & Rehabilitation

## 2018-05-25 VITALS — BP 135/81 | HR 110 | Ht 73.0 in | Wt 171.6 lb

## 2018-05-25 DIAGNOSIS — F418 Other specified anxiety disorders: Secondary | ICD-10-CM | POA: Diagnosis not present

## 2018-05-25 DIAGNOSIS — S61201D Unspecified open wound of left index finger without damage to nail, subsequent encounter: Secondary | ICD-10-CM | POA: Insufficient documentation

## 2018-05-25 DIAGNOSIS — Z87442 Personal history of urinary calculi: Secondary | ICD-10-CM | POA: Diagnosis not present

## 2018-05-25 DIAGNOSIS — M545 Low back pain: Secondary | ICD-10-CM | POA: Insufficient documentation

## 2018-05-25 DIAGNOSIS — G8111 Spastic hemiplegia affecting right dominant side: Secondary | ICD-10-CM | POA: Diagnosis not present

## 2018-05-25 DIAGNOSIS — M5136 Other intervertebral disc degeneration, lumbar region: Secondary | ICD-10-CM | POA: Diagnosis not present

## 2018-05-25 DIAGNOSIS — Z87828 Personal history of other (healed) physical injury and trauma: Secondary | ICD-10-CM | POA: Diagnosis not present

## 2018-05-25 DIAGNOSIS — M47816 Spondylosis without myelopathy or radiculopathy, lumbar region: Secondary | ICD-10-CM | POA: Diagnosis not present

## 2018-05-25 DIAGNOSIS — Z8249 Family history of ischemic heart disease and other diseases of the circulatory system: Secondary | ICD-10-CM | POA: Insufficient documentation

## 2018-05-25 DIAGNOSIS — K592 Neurogenic bowel, not elsewhere classified: Secondary | ICD-10-CM | POA: Diagnosis not present

## 2018-05-25 DIAGNOSIS — K589 Irritable bowel syndrome without diarrhea: Secondary | ICD-10-CM | POA: Diagnosis not present

## 2018-05-25 DIAGNOSIS — N529 Male erectile dysfunction, unspecified: Secondary | ICD-10-CM | POA: Diagnosis not present

## 2018-05-25 DIAGNOSIS — F329 Major depressive disorder, single episode, unspecified: Secondary | ICD-10-CM | POA: Insufficient documentation

## 2018-05-25 DIAGNOSIS — Z87891 Personal history of nicotine dependence: Secondary | ICD-10-CM | POA: Insufficient documentation

## 2018-05-25 DIAGNOSIS — Z79899 Other long term (current) drug therapy: Secondary | ICD-10-CM | POA: Insufficient documentation

## 2018-05-25 DIAGNOSIS — S12400D Unspecified displaced fracture of fifth cervical vertebra, subsequent encounter for fracture with routine healing: Secondary | ICD-10-CM | POA: Insufficient documentation

## 2018-05-25 MED ORDER — DICLOFENAC SODIUM 75 MG PO TBEC: 75.0000 mg | DELAYED_RELEASE_TABLET | Freq: Two times a day (BID) | ORAL | 3 refills | Status: DC

## 2018-05-25 NOTE — Progress Notes (Signed)
Billy Kelley is here in follow up of his spinal cord injury and spasticity. He was supposed to have Botox today.  However he has a lingering infection in his mouth due to abscessed teeth and there is some lingering considerations regarding his left index finger.  He did have his lumbar MRI recently and I reviewed the results and images with him today in the office.   Review of systems negative except for that mentioned above.    Vital signs stable     General: No acute distress HEENT: EOMI, oral membranes moist Cards: reg rate  Chest: normal effort Abdomen: Soft, NT, ND Skin: dry, intact Extremities: no edema  Neuro:Alert and oriented x3. CN exam intact. 3+ to 4/5 deltoid, bicep. 2-3/5 wrist and elbow extension, HI, 2/5 grip.Finger extensors remain very weak. Finger fexor tone RUE1/4, LT,PP 1+/2 RUE. LUE nearly 5/5 prox to distal. Normal sensory LUE. RLE: 2 HF, HE, 2- KE and KF, trace ADF/eversion, absent PF. Tone at ankle remains 1/4. Fluctuating tone knee at tr to 1/4. Pt's LLE: trace to 0/2 sensation. Strength grossly 4 to 4+/5.  Continues to walk with a steppage gait favoring the right leg.  Posture is fair. Musculoskeletal:  Low back is tender to palpation and pain is along the L3-L5 levels right more than left.  Ft. Psych:Pt's affect is appropriate   Assessment & Plan:  1. C5 fx/SCI with Billy Kelley Sequard Presentation  2. Spastic right hemiparesis  3. Neurogenic bowel  4. Reactive depression with anxiety, ?PTSD 5. Neuropathic pain due to #1 6. Strep infected left index finger wound 7. Increased low back pain, hx of lumbar spondylosis, worsened by chronic altered gait mechanics.     Plan:  1.Bowel spasms per GI 2.He is non-narcoticfrom this office. Narcotic per surgical providers. 3.Further botox RUE finger and wrist flexors, 300u when ID issues are clear 4.ContinueGabapentin 600mg  BID and 1800mg  qhs 5. Trial of diclofenac 75mg  BID for his low back.   He also has back exercises that we reviewed previously.  He is reticent to pursue physical therapy.  Discussed the importance of appropriate posture and body mechanics.  Needs to work on strengthening and lengthening his back as well. 6. Continue baclofen for now20mg  QID. klonopin to 0.5mg  q8.  7. Wound care per hand surgery ongoing.  8. viagra rx was filled today as he has ongoing erectile dysfunction.     15minutes of face to face patient care time were spent during this visit. All questions were encouraged and answered.  I will see him back in about a month when we can attempt the Botox injections again.  Extensive time today was spent reviewing patient's MRI and etiologies behind his pain.

## 2018-05-25 NOTE — Patient Instructions (Addendum)
You need to be aware of your posture, body mechanics each and every day.   Need to work on stretches for your low back each day.

## 2018-06-27 DIAGNOSIS — M25522 Pain in left elbow: Secondary | ICD-10-CM | POA: Diagnosis not present

## 2018-06-27 DIAGNOSIS — R52 Pain, unspecified: Secondary | ICD-10-CM | POA: Diagnosis not present

## 2018-07-10 ENCOUNTER — Other Ambulatory Visit: Payer: Self-pay | Admitting: Physical Medicine & Rehabilitation

## 2018-07-11 ENCOUNTER — Encounter: Payer: Self-pay | Admitting: Physical Medicine & Rehabilitation

## 2018-07-11 ENCOUNTER — Encounter
Payer: Worker's Compensation | Attending: Physical Medicine & Rehabilitation | Admitting: Physical Medicine & Rehabilitation

## 2018-07-11 VITALS — BP 104/60 | HR 88 | Ht 73.0 in | Wt 172.0 lb

## 2018-07-11 DIAGNOSIS — G8111 Spastic hemiplegia affecting right dominant side: Secondary | ICD-10-CM | POA: Insufficient documentation

## 2018-07-11 NOTE — Patient Instructions (Signed)
PLEASE FEEL FREE TO CALL OUR OFFICE WITH ANY PROBLEMS OR QUESTIONS ([phone])      

## 2018-07-11 NOTE — Progress Notes (Addendum)
Botox Injection for spasticity using needle EMG guidance Indication: Right spastic hemiparesis (HCC) G81.11   Dilution: 100 Units/ml        Total Units Injected: 300 RUE Indication: Severe spasticity which interferes with ADL,mobility and/or  hygiene and is unresponsive to medication management and other conservative care Informed consent was obtained after describing risks and benefits of the procedure with the patient. This includes bleeding, bruising, infection, excessive weakness, or medication side effects. A REMS form is on file and signed.  Needle: 50mm injectable monopolar needle electrode  Number of units per muscle Pectoralis Major 0 units Pectoralis Minor 0 units Biceps 0 units Brachioradialis 0 units FCR 25 units FCU 25 units FDS 85 units FDP 85 units FPL 0 units Pronator Teres 0 units Pronator Quadratus 0 units Lumbricals: 80u 4 muscles  All injections were done after obtaining appropriate EMG activity and after negative drawback for blood. The patient tolerated the procedure well. Post procedure instructions were given. Return in about 3 months (around 10/10/2018) for Botox 300 u RUE.   Patient also disclosed to me that he is Worker's Comp. will no longer be covering baclofen as of this spring.  Suggested that he could try tizanidine or Dantrium to replace this.  He would need a taper of his baclofen while starting a new medication.  He will let me know at his next follow-up visit.

## 2018-08-11 DIAGNOSIS — F411 Generalized anxiety disorder: Secondary | ICD-10-CM | POA: Diagnosis not present

## 2018-08-11 DIAGNOSIS — F902 Attention-deficit hyperactivity disorder, combined type: Secondary | ICD-10-CM | POA: Diagnosis not present

## 2018-08-11 DIAGNOSIS — F4312 Post-traumatic stress disorder, chronic: Secondary | ICD-10-CM | POA: Diagnosis not present

## 2018-08-11 DIAGNOSIS — F332 Major depressive disorder, recurrent severe without psychotic features: Secondary | ICD-10-CM | POA: Diagnosis not present

## 2018-09-08 DIAGNOSIS — F1020 Alcohol dependence, uncomplicated: Secondary | ICD-10-CM | POA: Diagnosis not present

## 2018-09-08 DIAGNOSIS — F1220 Cannabis dependence, uncomplicated: Secondary | ICD-10-CM | POA: Diagnosis not present

## 2018-09-08 DIAGNOSIS — F411 Generalized anxiety disorder: Secondary | ICD-10-CM | POA: Diagnosis not present

## 2018-09-08 DIAGNOSIS — F4312 Post-traumatic stress disorder, chronic: Secondary | ICD-10-CM | POA: Diagnosis not present

## 2018-09-08 DIAGNOSIS — F1110 Opioid abuse, uncomplicated: Secondary | ICD-10-CM | POA: Diagnosis not present

## 2018-09-08 DIAGNOSIS — F902 Attention-deficit hyperactivity disorder, combined type: Secondary | ICD-10-CM | POA: Diagnosis not present

## 2018-09-08 DIAGNOSIS — F332 Major depressive disorder, recurrent severe without psychotic features: Secondary | ICD-10-CM | POA: Diagnosis not present

## 2018-09-13 ENCOUNTER — Other Ambulatory Visit: Payer: Self-pay | Admitting: Physical Medicine & Rehabilitation

## 2018-09-13 DIAGNOSIS — G8111 Spastic hemiplegia affecting right dominant side: Secondary | ICD-10-CM

## 2018-09-13 DIAGNOSIS — S14145S Brown-Sequard syndrome at C5 level of cervical spinal cord, sequela: Secondary | ICD-10-CM

## 2018-09-13 DIAGNOSIS — G894 Chronic pain syndrome: Secondary | ICD-10-CM

## 2018-09-13 DIAGNOSIS — Z5181 Encounter for therapeutic drug level monitoring: Secondary | ICD-10-CM

## 2018-09-13 DIAGNOSIS — Z79899 Other long term (current) drug therapy: Secondary | ICD-10-CM

## 2018-09-22 DIAGNOSIS — R1084 Generalized abdominal pain: Secondary | ICD-10-CM | POA: Diagnosis not present

## 2018-09-22 DIAGNOSIS — A64 Unspecified sexually transmitted disease: Secondary | ICD-10-CM | POA: Diagnosis not present

## 2018-09-22 DIAGNOSIS — Z7251 High risk heterosexual behavior: Secondary | ICD-10-CM | POA: Diagnosis not present

## 2018-09-22 DIAGNOSIS — Z114 Encounter for screening for human immunodeficiency virus [HIV]: Secondary | ICD-10-CM | POA: Diagnosis not present

## 2018-09-28 DIAGNOSIS — R52 Pain, unspecified: Secondary | ICD-10-CM | POA: Diagnosis not present

## 2018-09-28 DIAGNOSIS — G5622 Lesion of ulnar nerve, left upper limb: Secondary | ICD-10-CM | POA: Diagnosis not present

## 2018-10-10 ENCOUNTER — Other Ambulatory Visit: Payer: Self-pay | Admitting: Physical Medicine & Rehabilitation

## 2018-10-10 ENCOUNTER — Ambulatory Visit: Payer: Self-pay | Admitting: Physical Medicine & Rehabilitation

## 2018-10-11 ENCOUNTER — Other Ambulatory Visit: Payer: Self-pay

## 2018-10-11 ENCOUNTER — Encounter
Payer: Worker's Compensation | Attending: Physical Medicine & Rehabilitation | Admitting: Physical Medicine & Rehabilitation

## 2018-10-11 ENCOUNTER — Encounter: Payer: Self-pay | Admitting: Physical Medicine & Rehabilitation

## 2018-10-11 VITALS — BP 114/76 | HR 83 | Resp 14 | Ht 73.0 in | Wt 174.0 lb

## 2018-10-11 DIAGNOSIS — G8111 Spastic hemiplegia affecting right dominant side: Secondary | ICD-10-CM | POA: Diagnosis not present

## 2018-10-11 NOTE — Patient Instructions (Signed)
PLEASE FEEL FREE TO CALL OUR OFFICE WITH ANY PROBLEMS OR QUESTIONS ([phone])      

## 2018-10-11 NOTE — Progress Notes (Signed)
Botox Injection for spasticity using needle EMG guidance Indication: Right spastic hemiparesis (HCC)   Dilution: 100 Units/ml        Total Units Injected: 300 Indication: Severe spasticity which interferes with ADL,mobility and/or  hygiene and is unresponsive to medication management and other conservative care Informed consent was obtained after describing risks and benefits of the procedure with the patient. This includes bleeding, bruising, infection, excessive weakness, or medication side effects. A REMS form is on file and signed.  Needle: 27mm injectable monopolar needle electrode  Number of units per muscle Pectoralis Major 0 units Pectoralis Minor 0 units Biceps 0 units Brachioradialis 0 units FCR 25 units FCU 25 units FDS 125 units FDP 125 units FPL 0 units Pronator Teres 0 units Pronator Quadratus 0 units Lumbricals 25u x 4  All injections were done after obtaining appropriate EMG activity and after negative drawback for blood. The patient tolerated the procedure well. Post procedure instructions were given. Return in about 3 months (around 01/10/2019) for BOTOX 300 RIGHT UPPER.

## 2018-11-03 DIAGNOSIS — F332 Major depressive disorder, recurrent severe without psychotic features: Secondary | ICD-10-CM | POA: Diagnosis not present

## 2018-11-03 DIAGNOSIS — F902 Attention-deficit hyperactivity disorder, combined type: Secondary | ICD-10-CM | POA: Diagnosis not present

## 2018-11-03 DIAGNOSIS — F411 Generalized anxiety disorder: Secondary | ICD-10-CM | POA: Diagnosis not present

## 2018-11-03 DIAGNOSIS — F4312 Post-traumatic stress disorder, chronic: Secondary | ICD-10-CM | POA: Diagnosis not present

## 2019-01-10 ENCOUNTER — Telehealth: Payer: Self-pay

## 2019-01-10 ENCOUNTER — Other Ambulatory Visit: Payer: Self-pay | Admitting: Physical Medicine & Rehabilitation

## 2019-01-10 NOTE — Telephone Encounter (Signed)
Patient called clinic, left message stated needs a new leg brace for his leg so he would qualify with workers comp

## 2019-01-24 ENCOUNTER — Encounter: Payer: Self-pay | Admitting: Physical Medicine & Rehabilitation

## 2019-01-24 ENCOUNTER — Encounter
Payer: Worker's Compensation | Attending: Physical Medicine & Rehabilitation | Admitting: Physical Medicine & Rehabilitation

## 2019-01-24 ENCOUNTER — Other Ambulatory Visit: Payer: Self-pay

## 2019-01-24 VITALS — BP 103/67 | HR 77 | Temp 96.6°F | Ht 73.0 in | Wt 168.6 lb

## 2019-01-24 DIAGNOSIS — G8111 Spastic hemiplegia affecting right dominant side: Secondary | ICD-10-CM | POA: Diagnosis not present

## 2019-01-24 NOTE — Progress Notes (Signed)
Botox Injection for spasticity using needle EMG guidance Indication: Right spastic hemiparesis (HCC) -   Dilution: 100 Units/ml        Total Units Injected: 500 Indication: Severe spasticity which interferes with ADL,mobility and/or  hygiene and is unresponsive to medication management and other conservative care Informed consent was obtained after describing risks and benefits of the procedure with the patient. This includes bleeding, bruising, infection, excessive weakness, or medication side effects. A REMS form is on file and signed.  Needle: 51mm injectable monopolar needle electrode  Number of units per muscle Pectoralis Major 0 units Pectoralis Minor 0 units Biceps 0 units Brachioradialis 0 units FCR 25 units FCU 25 units FDS 175 units FDP 175 units FPL 0 units Pronator Teres 0 units Pronator Quadratus 0 units Right hamstrings 200 units  All injections were done after obtaining appropriate EMG activity and after negative drawback for blood. The patient tolerated the procedure well. Post procedure instructions were given. Return in about 3 months (around 04/26/2019) for botox 300 RUE. vs 200 in hamstrings also

## 2019-01-24 NOTE — Patient Instructions (Signed)
PLEASE FEEL FREE TO CALL OUR OFFICE WITH ANY PROBLEMS OR QUESTIONS ([phone])      

## 2019-04-13 ENCOUNTER — Other Ambulatory Visit: Payer: Self-pay | Admitting: Physical Medicine & Rehabilitation

## 2019-04-26 ENCOUNTER — Other Ambulatory Visit: Payer: Self-pay

## 2019-04-26 ENCOUNTER — Encounter
Payer: Worker's Compensation | Attending: Physical Medicine & Rehabilitation | Admitting: Physical Medicine & Rehabilitation

## 2019-04-26 ENCOUNTER — Encounter: Payer: Self-pay | Admitting: Physical Medicine & Rehabilitation

## 2019-04-26 ENCOUNTER — Telehealth: Payer: Self-pay | Admitting: Physical Medicine & Rehabilitation

## 2019-04-26 VITALS — BP 125/69 | HR 87 | Ht 73.0 in | Wt 172.0 lb

## 2019-04-26 DIAGNOSIS — G8111 Spastic hemiplegia affecting right dominant side: Secondary | ICD-10-CM | POA: Diagnosis not present

## 2019-04-26 NOTE — Progress Notes (Signed)
.  Botox Injection for spasticity using needle EMG guidance Indication: Right spastic hemiparesis (HCC)   Dilution: 100 Units/ml        Total Units Injected: 500 Indication: Severe spasticity which interferes with ADL,mobility and/or  hygiene and is unresponsive to medication management and other conservative care Informed consent was obtained after describing risks and benefits of the procedure with the patient. This includes bleeding, bruising, infection, excessive weakness, or medication side effects. A REMS form is on file and signed.  Needle: 48mm injectable monopolar needle electrode  Number of units per muscle Pectoralis Major 0 units Pectoralis Minor 0 units Biceps 0 units Brachioradialis 0 units FCR 50 units FCU 50 units FDS 150 units FDP 150 units FPL 0 units Pronator Teres 100 units Lumbricals 100 units Quadriceps 0 units Gastroc/soleus 0 units Hamstrings 0 units Tibialis Posterior 0 units EHL 0 units All injections were done after obtaining appropriate EMG activity and after negative drawback for blood. The patient tolerated the procedure well. Post procedure instructions were given. Return in about 3 months (around 07/27/2019), or if symptoms worsen or fail to improve.

## 2019-04-26 NOTE — Telephone Encounter (Signed)
Per Dr. Naaman Plummer, do not reschedule this patient unless he is aware.

## 2019-04-26 NOTE — Patient Instructions (Signed)
PLEASE FEEL FREE TO CALL OUR OFFICE WITH ANY PROBLEMS OR QUESTIONS ([phone])      

## 2019-05-03 ENCOUNTER — Telehealth: Payer: Self-pay | Admitting: Physical Medicine & Rehabilitation

## 2019-05-03 NOTE — Telephone Encounter (Signed)
After the incoherent episode in my office I'm not sure that he should be coming back here for care. The patient will need to explain to me in writing why he needs to come back to see me, needs botox, etc.

## 2019-05-03 NOTE — Telephone Encounter (Signed)
Patient called wanting to set up his 3 month Botox injection.  Said he left office without scheduling.  I explained to him that I would need to ask Dr. Naaman Plummer if we could reschedule.  Please advise.

## 2019-05-05 ENCOUNTER — Other Ambulatory Visit: Payer: Self-pay | Admitting: Physical Medicine & Rehabilitation

## 2019-05-05 DIAGNOSIS — Z5181 Encounter for therapeutic drug level monitoring: Secondary | ICD-10-CM

## 2019-05-05 DIAGNOSIS — G894 Chronic pain syndrome: Secondary | ICD-10-CM

## 2019-05-05 DIAGNOSIS — S14145S Brown-Sequard syndrome at C5 level of cervical spinal cord, sequela: Secondary | ICD-10-CM

## 2019-05-05 DIAGNOSIS — Z79899 Other long term (current) drug therapy: Secondary | ICD-10-CM

## 2019-05-05 DIAGNOSIS — G8111 Spastic hemiplegia affecting right dominant side: Secondary | ICD-10-CM

## 2019-05-23 ENCOUNTER — Other Ambulatory Visit: Payer: Self-pay | Admitting: *Deleted

## 2019-05-25 ENCOUNTER — Other Ambulatory Visit: Payer: Self-pay | Admitting: Physical Medicine & Rehabilitation

## 2019-05-25 DIAGNOSIS — Z5181 Encounter for therapeutic drug level monitoring: Secondary | ICD-10-CM

## 2019-05-25 DIAGNOSIS — G894 Chronic pain syndrome: Secondary | ICD-10-CM

## 2019-05-25 DIAGNOSIS — G8111 Spastic hemiplegia affecting right dominant side: Secondary | ICD-10-CM

## 2019-05-25 DIAGNOSIS — Z79899 Other long term (current) drug therapy: Secondary | ICD-10-CM

## 2019-05-25 DIAGNOSIS — S14145S Brown-Sequard syndrome at C5 level of cervical spinal cord, sequela: Secondary | ICD-10-CM

## 2019-06-02 ENCOUNTER — Telehealth: Payer: Self-pay

## 2019-06-02 NOTE — Telephone Encounter (Signed)
Mom called stating that [facility] didn't have prescription. Called [facility] and they didn't have prescription, gave refill info to pharmacist.

## 2019-06-02 NOTE — Telephone Encounter (Signed)
Maureen patient mother called asking if you would call her back. I called her back and she stated [facility] didn't get Gabapentin prescription. I see where it was sent and I will call and confirm with [facility]. Mom still would like you to call her.

## 2019-08-23 ENCOUNTER — Encounter
Payer: Worker's Compensation | Attending: Physical Medicine & Rehabilitation | Admitting: Physical Medicine & Rehabilitation

## 2019-08-23 ENCOUNTER — Encounter: Payer: Self-pay | Admitting: Physical Medicine & Rehabilitation

## 2019-08-23 ENCOUNTER — Other Ambulatory Visit: Payer: Self-pay

## 2019-08-23 VITALS — BP 133/86 | HR 109 | Temp 97.5°F | Ht 73.0 in | Wt 177.8 lb

## 2019-08-23 DIAGNOSIS — G8111 Spastic hemiplegia affecting right dominant side: Secondary | ICD-10-CM | POA: Diagnosis not present

## 2019-08-23 DIAGNOSIS — R1313 Dysphagia, pharyngeal phase: Secondary | ICD-10-CM

## 2019-08-23 NOTE — Progress Notes (Signed)
Botox Injection for spasticity using needle EMG guidance Indication: Pharyngeal dysphagia - Plan: SLP modified barium swallow  Right spastic hemiparesis (HCC) - Plan: Ambulatory referral to Physical Therapy   Dilution: 100 Units/ml        Total Units Injected: 500 Indication: Severe spasticity which interferes with ADL,mobility and/or  hygiene and is unresponsive to medication management and other conservative care Informed consent was obtained after describing risks and benefits of the procedure with the patient. This includes bleeding, bruising, infection, excessive weakness, or medication side effects. A REMS form is on file and signed.  Needle: 56mm injectable monopolar needle electrode  right Number of units per muscle Pectoralis Major 150 units Pectoralis Minor 150 units Biceps 0 units Brachioradialis 0 units FCR 25 units FCU 25 units FDS 75 units FDP 75 units FPL 0 units Pronator Teres 0 units Pronator Quadratus 0 units Lumbricals 100 units All injections were done after obtaining appropriate EMG activity and after negative drawback for blood. The patient tolerated the procedure well. Post procedure instructions were given. Return in about 3 months (around 11/20/2019) for botox 500 u RUE.

## 2019-08-23 NOTE — Patient Instructions (Signed)
PLEASE FEEL FREE TO CALL OUR OFFICE WITH ANY PROBLEMS OR QUESTIONS ([phone])      

## 2019-08-24 ENCOUNTER — Telehealth: Payer: Self-pay | Admitting: *Deleted

## 2019-08-24 NOTE — Telephone Encounter (Signed)
Patient left a message stating that his lawyer told him that a physician needs to fill out and sign the C-4 form. He says it cannot be filled out by a physical therapist.  He is asking for a call back so he can inform his lawyer who will be filling out the form, Dr. Riley Kill or Dr. Riley Kill and the therapist.

## 2019-08-25 NOTE — Telephone Encounter (Signed)
That was an extensive, multiple paged form. A functional capacity exam would more thoroughly and objectively outline his functional deficits and capacities.   If I am to fill out this form there will be a fee

## 2019-08-25 NOTE — Telephone Encounter (Signed)
Contacted patient. He states Dr. Riley Kill has arranged the FCE.  Patient has a 4 hour appointment with physical therapy

## 2019-09-04 ENCOUNTER — Other Ambulatory Visit (HOSPITAL_COMMUNITY): Payer: Self-pay

## 2019-09-04 DIAGNOSIS — R1310 Dysphagia, unspecified: Secondary | ICD-10-CM

## 2019-09-05 ENCOUNTER — Telehealth: Payer: Self-pay

## 2019-09-05 NOTE — Telephone Encounter (Signed)
Patient called requesting refill on Prozac. Not seeing Psychiatrist anymore due to insurance reasons.

## 2019-09-06 MED ORDER — FLUOXETINE HCL 20 MG PO CAPS: ORAL_CAPSULE | ORAL | 3 refills | Status: AC

## 2019-09-06 NOTE — Telephone Encounter (Signed)
Prozac rf'ed

## 2019-09-08 ENCOUNTER — Ambulatory Visit: Payer: Medicare HMO

## 2019-09-12 ENCOUNTER — Ambulatory Visit (HOSPITAL_COMMUNITY)
Admission: RE | Admit: 2019-09-12 | Discharge: 2019-09-12 | Disposition: A | Discharge status: Home / Self Care | Payer: Medicare HMO | Source: Ambulatory Visit | Attending: Physical Medicine & Rehabilitation | Admitting: Physical Medicine & Rehabilitation

## 2019-09-12 ENCOUNTER — Other Ambulatory Visit: Payer: Self-pay

## 2019-09-12 DIAGNOSIS — R1310 Dysphagia, unspecified: Secondary | ICD-10-CM | POA: Diagnosis not present

## 2019-09-12 DIAGNOSIS — R1313 Dysphagia, pharyngeal phase: Secondary | ICD-10-CM

## 2019-09-12 NOTE — Progress Notes (Signed)
Modified Barium Swallow Progress Note  Patient Details  Name: Billy Kelley MRN: [id number] Date of Birth: Sep 19, 1975  Today's Date: 09/12/2019  Modified Barium Swallow completed.  Full report located under Chart Review in the Imaging Section.  Brief recommendations include the following:  Clinical Impression  Patient presents with pharyngeal and esophageal dysphagia, suspected to be structural in nature. Pharyngeal dysphagia is remarkable for reduced epiglottic inversion resulting in significant vallecular residue as epiglottis runs into posterior pharyngeal wall. The patient appears to have a tight UES, resulting in residue in the pyriform sinuses, which essentially resulted in penetration (PAS 2) after the swallow. SLP had pt utilize mutliple strategies to reduce amount of backflow in the pharynx, with an effortful swallow being slightly efffective. The pt also utilized a liquid wash as needed which was effective. The patient appears to have good sensation of when he experiencing residue, as he reported he was using similar compensatory strategies prior to the study. Recommend continuing with regular diet and thin liquids, but avoiding foods that give the pt more difficulty. The pt has a planned appt with his doctor (Dr. Riley Kill) next month (4/21), and recommended discussing results further with his doctor as far as a plan moving forward, given suspicion for more structural etiology.    Swallow Evaluation Recommendations   Recommended Consults: Consider ENT evaluation;Consider GI evaluation;Consider esophageal assessment   SLP Diet Recommendations: Regular solids;Thin liquid                               Maudry Mayhew, Student SLP Office: [phone]  09/12/2019,1:43 PM

## 2019-09-29 ENCOUNTER — Ambulatory Visit: Payer: Medicare HMO

## 2019-10-06 ENCOUNTER — Other Ambulatory Visit: Payer: Self-pay

## 2019-10-06 ENCOUNTER — Ambulatory Visit: Payer: Medicare HMO | Attending: Physical Medicine & Rehabilitation

## 2019-10-06 DIAGNOSIS — G8111 Spastic hemiplegia affecting right dominant side: Secondary | ICD-10-CM | POA: Diagnosis not present

## 2019-10-06 NOTE — Therapy (Signed)
[place], [place], [address] Phone: [phone]   Fax:  [phone]  Physical Therapy Evaluation/FCE  Patient Details  Name: Billy Kelley MRN: [id number] Date of Birth: 06-30-1976 Referring Provider (PT): Leatha Gilding, MD   Encounter Date: 10/06/2019  PT End of Session - 10/06/19 1100    Visit Number  1    Number of Visits  1    PT Start Time  1100    PT Stop Time  1400    PT Time Calculation (min)  180 min    Activity Tolerance  Patient tolerated treatment well;Patient limited by pain;Patient limited by fatigue    Behavior During Therapy  [facility] for tasks assessed/performed       Past Medical History:  Diagnosis Date  . Kidney stone 2012   passed with fluids   . 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 BRAIN SURGERY    . HERNIA REPAIR Bilateral 1976  . INCISION AND DRAINAGE Left 04/19/2018   Procedure: INCISION AND DRAINAGE LEFT INDEX FINGER;  Surgeon: Daryll Brod, MD;  Location: [facility];  Service: Orthopedics;  Laterality: Left;  . INCISION AND DRAINAGE ABSCESS Left 04/05/2018   Procedure: INCISION AND DRAINAGE LEFT INDEX FINGER;  Surgeon: Daryll Brod, MD;  Location: [facility];  Service: Orthopedics;  Laterality: Left;  . spinal cord fusion  2005    C4-C6     There were no vitals filed for this visit.   Subjective Assessment - 10/06/19 1105    Subjective  He reports fracture C5 in construction accident in 2005 with significant residual deficiets  in the RT side of his body. He needs FCE for paperwork for [facility] impairment rating         [facility] PT Assessment - 10/06/19 0001      Assessment   Medical Diagnosis  RT spastic hemiparesis    Referring Provider (PT)  Leatha Gilding, MD    Onset Date/Surgical Date  --   2005   Hand Dominance  Right    Prior Therapy  NA      Precautions   Precautions  None      Restrictions   Weight Bearing Restrictions  No      Cognition   Overall Cognitive Status  Within Functional Limits for tasks assessed                Objective measurements completed on examination: See above findings.              PT Education - 10/06/19 1113    Education Details  Expect to  feel sore for 2-4 days post testing    Person(s) Educated  Patient    Methods  Explanation    Comprehension  Verbalized understanding                  Plan - 10/06/19 1506    Clinical Impression Statement  Mr Neis was rated less than sedentary level of work due to  lifting and carrying limtiations  In part this was related to lack of RT UE function and need for [facility] use with his LT hand to safely walk and be mobile with turning and moving laterally. He needs an AFO for his RT ankle to improve safety with  andy activity on  his feet to decrease risk of fall. His current AFO snapped in 2 peices.    PT Treatment/Interventions  Other (comment)    PT Next Visit Plan  FCE report will be sent with the Workers Compensatin paperwork to Dr Naaman Plummer.    Consulted and Agree with Plan of Care  Patient       Patient will benefit from skilled therapeutic intervention in order to improve the following deficits and impairments:     Visit Diagnosis: Right spastic hemiparesis [facility])     Problem List Patient Active Problem List   Diagnosis Date Noted  . Spondylosis without myelopathy or radiculopathy, lumbar region 04/12/2018  . Chronic pain syndrome 12/08/2017  . Erectile dysfunction 08/25/2016  . Dysphagia 07/08/2016  . Low back pain 03/08/2015  . Right low back pain 12/19/2014  . Fatigue 12/19/2014  . Neck muscle spasm 12/19/2014  . Allergic rhinitis 12/19/2014  . Neurogenic bowel 10/01/2014  . Right spastic hemiparesis ([place]) 08/22/2014  . 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 Neurogenic bladder 06/20/2014  . Low BP 03/22/2014  . Depression 03/09/2014  . Anxiety 03/09/2014  . Smoker 03/09/2014  . Closed fracture of C5-C7 level with incomplete spinal cord lesion     Darrel Hoover  PT 10/06/2019, 3:12 PM  [facility] [address] [place], [place], [address] Phone: [phone]   Fax:  [phone]  Name: Demetrion Wesby MRN: [id number] Date of Birth: Sep 28, 1975 PHYSICAL THERAPY DISCHARGE SUMMARY  Visits from Start of Care: 1  Current functional level related to goals / functional outcomes: See FCE   Remaining deficits: See FCE   Education / Equipment:NA Plan: Patient agrees to discharge.  Patient goals were met. Patient is being discharged due to meeting the stated rehab goals.  ?????

## 2019-11-16 NOTE — Telephone Encounter (Signed)
Opened in error

## 2019-11-22 ENCOUNTER — Encounter: Payer: Self-pay | Admitting: Physical Medicine & Rehabilitation

## 2019-11-22 ENCOUNTER — Other Ambulatory Visit: Payer: Self-pay

## 2019-11-22 ENCOUNTER — Encounter
Payer: Worker's Compensation | Attending: Physical Medicine & Rehabilitation | Admitting: Physical Medicine & Rehabilitation

## 2019-11-22 VITALS — BP 129/79 | HR 84 | Ht 73.0 in | Wt 177.0 lb

## 2019-11-22 DIAGNOSIS — G8111 Spastic hemiplegia affecting right dominant side: Secondary | ICD-10-CM | POA: Insufficient documentation

## 2019-11-22 NOTE — Progress Notes (Signed)
Botox Injection for spasticity using needle EMG guidance Indication: Right spastic hemiparesis (HCC)   Dilution: 100 Units/ml        Total Units Injected: 500 Indication: Severe spasticity which interferes with ADL,mobility and/or  hygiene and is unresponsive to medication management and other conservative care Informed consent was obtained after describing risks and benefits of the procedure with the patient. This includes bleeding, bruising, infection, excessive weakness, or medication side effects. A REMS form is on file and signed.  Needle: 34mm injectable monopolar needle electrode  right Number of units per muscle Pectoralis Major 0 units Pectoralis Minor 100 units Biceps 0 units Brachioradialis 0 units FCR 25 units FCU 25 units FDS 150 units FDP 150 units FPL 0 units Pronator Teres 0 units Pronator Quadratus 0 units Lumbricals 50 units All injections were done after obtaining appropriate EMG activity and after negative drawback for blood. The patient tolerated the procedure well. Post procedure instructions were given. Return in about 3 months (around 02/22/2020).

## 2019-11-22 NOTE — Patient Instructions (Signed)
PLEASE FEEL FREE TO CALL OUR OFFICE WITH ANY PROBLEMS OR QUESTIONS ([phone])      

## 2019-11-23 ENCOUNTER — Other Ambulatory Visit: Payer: Self-pay

## 2019-11-23 ENCOUNTER — Encounter (HOSPITAL_COMMUNITY): Payer: Self-pay | Admitting: Pediatrics

## 2019-11-23 ENCOUNTER — Emergency Department (HOSPITAL_COMMUNITY)
Admission: EM | Admit: 2019-11-23 | Discharge: 2019-11-23 | Disposition: A | Discharge status: Home / Self Care | Payer: Medicare HMO | Attending: Emergency Medicine | Admitting: Emergency Medicine

## 2019-11-23 ENCOUNTER — Emergency Department (HOSPITAL_COMMUNITY): Payer: Medicare HMO

## 2019-11-23 DIAGNOSIS — F1290 Cannabis use, unspecified, uncomplicated: Secondary | ICD-10-CM | POA: Diagnosis not present

## 2019-11-23 DIAGNOSIS — R112 Nausea with vomiting, unspecified: Secondary | ICD-10-CM | POA: Diagnosis not present

## 2019-11-23 DIAGNOSIS — Z87891 Personal history of nicotine dependence: Secondary | ICD-10-CM | POA: Insufficient documentation

## 2019-11-23 DIAGNOSIS — R0789 Other chest pain: Secondary | ICD-10-CM

## 2019-11-23 DIAGNOSIS — Z79899 Other long term (current) drug therapy: Secondary | ICD-10-CM | POA: Insufficient documentation

## 2019-11-23 LAB — BASIC METABOLIC PANEL
Anion gap: 10 (ref 5–15)
BUN: 12 mg/dL (ref 6–20)
CO2: 27 mmol/L (ref 22–32)
Calcium: 9.5 mg/dL (ref 8.9–10.3)
Chloride: 102 mmol/L (ref 98–111)
Creatinine, Ser: 0.8 mg/dL (ref 0.61–1.24)
GFR calc Af Amer: >60 mL/min (ref >60)
GFR calc non Af Amer: >60 mL/min (ref >60)
Glucose, Bld: 86 mg/dL (ref 70–99)
Potassium: 4 mmol/L (ref 3.5–5.1)
Sodium: 139 mmol/L (ref 135–145)

## 2019-11-23 LAB — CBC
HCT: 45 % (ref 39.0–52.0)
Hemoglobin: 14.5 g/dL (ref 13.0–17.0)
MCH: 28.7 pg (ref 26.0–34.0)
MCHC: 32.2 g/dL (ref 30.0–36.0)
MCV: 89.1 fL (ref 80.0–100.0)
Platelets: 265 10*3/uL (ref 150–400)
RBC: 5.05 MIL/uL (ref 4.22–5.81)
RDW: 12.3 % (ref 11.5–15.5)
WBC: 10 10*3/uL (ref 4.0–10.5)
nRBC: 0 % (ref 0.0–0.2)

## 2019-11-23 LAB — TROPONIN I (HIGH SENSITIVITY): Troponin I (High Sensitivity): <2 ng/L (ref <18)

## 2019-11-23 MED ORDER — ONDANSETRON 4 MG PO TBDP
4.0000 mg | ORAL_TABLET | Freq: Once | ORAL | Status: AC
  Administered 2019-11-23: 4 mg via ORAL
  Filled 2019-11-23: qty 1

## 2019-11-23 MED ORDER — SODIUM CHLORIDE 0.9% FLUSH: 3.0000 mL | Freq: Once | INTRAVENOUS | Status: DC

## 2019-11-23 MED ORDER — ONDANSETRON 4 MG PO TBDP
4.0000 mg | ORAL_TABLET | Freq: Three times a day (TID) | ORAL | 0 refills | Status: DC | PRN
Start: 2019-11-23 — End: 2024-05-18

## 2019-11-23 NOTE — Discharge Instructions (Addendum)
Take the Zofran for the nausea and vomiting.  Maybe this will help your appetite and allow you to get some fluids and bland food and.  Work-up for the chest pain without any acute findings.  Lab results printed to the back of your discharge instructions.  Return for any new or worse symptoms.  Would make an appointment with your doctors.  To see if there is anything they can do to help with the muscle spasm in the left anterior chest area.

## 2019-11-23 NOTE — ED Notes (Signed)
Patient verbalizes understanding of discharge instructions. Opportunity for questioning and answers were provided. Pt discharged from ED. 

## 2019-11-23 NOTE — ED Provider Notes (Signed)
[facility] EMERGENCY DEPARTMENT Provider Note   CSN: [id number] Arrival date & time: 11/23/19  3428     History Chief Complaint  Patient presents with  . Chest Pain    Billy Kelley is a 44 y.o. male.  Patient with old cervical spine injury due to a fall.  Still followed by rehab medicine.  Able to walk with a cane.  Patient with chest pain for 3 days left anterior chest.  But has also noticed muscle spasm there.  Is been somewhat constant for the past 2 days.  Associated with some rare episodes of vomiting.  No diarrhea.  No blood in the vomit.  No shortness of breath.        Past Medical History:  Diagnosis Date  . Kidney stone 2012   passed with fluids   . 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 Spondylosis without myelopathy or radiculopathy, lumbar region 04/12/2018  . Chronic pain syndrome 12/08/2017  . Erectile dysfunction 08/25/2016  . Dysphagia 07/08/2016  . Low back pain 03/08/2015  . Right low back pain 12/19/2014  . Fatigue 12/19/2014  . Neck muscle spasm 12/19/2014  . Allergic rhinitis 12/19/2014  . Neurogenic bowel 10/01/2014  . Right spastic hemiparesis ([place]) 08/22/2014  . 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 Neurogenic bladder 06/20/2014  . Low BP 03/22/2014  . Depression 03/09/2014  . Anxiety 03/09/2014  . Smoker 03/09/2014  . Closed fracture of C5-C7 level with incomplete spinal cord lesion     Past Surgical History:  Procedure Laterality Date  . BRAIN SURGERY    . HERNIA REPAIR Bilateral 1976  . INCISION AND DRAINAGE Left 04/19/2018   Procedure: INCISION AND DRAINAGE LEFT INDEX FINGER;  Surgeon: Daryll Brod, MD;  Location: [facility];  Service: Orthopedics;  Laterality: Left;  . INCISION AND DRAINAGE ABSCESS Left 04/05/2018   Procedure: INCISION AND DRAINAGE LEFT INDEX FINGER;  Surgeon: Daryll Brod, MD;  Location: [facility];  Service: Orthopedics;  Laterality: Left;  . spinal cord fusion  2005    C4-C6        Family History  Problem Relation Age of Onset  . Hypertension Mother   . Hyperlipidemia Mother   . Cancer Mother 20       endometrial   . Heart disease Father        CHF   . Diabetes Neg Hx     Social History   Tobacco Use  . Smoking status: Former Smoker    Packs/day: 0.50    Years: 20.00    Pack years: 10.00    Types: Cigarettes    Quit date: 06/14/2016    Years since quitting: 3.4  . Smokeless tobacco: Never Used  Substance Use Topics  . Alcohol use: No  . Drug use: Yes    Types: Marijuana    Comment: 2-3 times week     Home Medications Prior to Admission medications   Medication Sig Start Date End Date Taking? Authorizing Provider  baclofen (LIORESAL) 20 MG tablet TAKE 1 TABLET BY MOUTH FOUR TIMES DAILY 04/14/19   Meredith Staggers, MD  clonazePAM (KLONOPIN) 0.5 MG tablet TAKE 1 TABLET(0.5 MG) BY MOUTH THREE TIMES DAILY AS NEEDED Patient not taking: Reported on 04/26/2019 03/09/18  Ranelle Oyster, MD  diclofenac (VOLTAREN) 75 MG EC tablet Take 1 tablet (75 mg total) by mouth 2 (two) times daily with a meal. Patient not taking: Reported on 04/26/2019 05/25/18   Ranelle Oyster, MD  FLUoxetine (PROZAC) 20 MG capsule TAKE 3 CAPSULES(60 MG) BY MOUTH DAILY 09/06/19   Ranelle Oyster, MD  gabapentin (NEURONTIN) 600 MG tablet TAKE 1 TABLET(600 MG) BY MOUTH FIVE TIMES DAILY 06/02/19   Ranelle Oyster, MD  HYDROcodone-acetaminophen (NORCO) 5-325 MG tablet Take 1 tablet by mouth every 6 (six) hours as needed. 04/19/18   Cindee Salt, MD  ondansetron (ZOFRAN ODT) 4 MG disintegrating tablet Take 1 tablet (4 mg total) by mouth every 8 (eight) hours as needed for nausea or vomiting. 11/23/19   Vanetta Mulders, MD  sildenafil (VIAGRA) 100 MG tablet TAKE 1/2 TO 1 TABLET BY  MOUTH DAILY AS NEEDED FOR ERECTILE DYSFUNCTION 04/12/18   Ranelle Oyster, MD    Allergies    Patient has no known allergies.  Review of Systems   Review of Systems  Constitutional: Negative for chills and fever.  HENT: Negative for congestion, rhinorrhea and sore throat.   Eyes: Negative for visual disturbance.  Respiratory: Negative for cough and shortness of breath.   Cardiovascular: Positive for chest pain. Negative for leg swelling.  Gastrointestinal: Positive for nausea and vomiting. Negative for abdominal pain and diarrhea.  Genitourinary: Negative for dysuria.  Musculoskeletal: Negative for back pain and neck pain.  Skin: Negative for rash.  Neurological: Negative for dizziness, light-headedness and headaches.  Hematological: Does not bruise/bleed easily.  Psychiatric/Behavioral: Negative for confusion.    Physical Exam Updated Vital Signs BP 126/82   Pulse 84   Temp 98.1 F (36.7 C) (Oral)   Resp 20   Ht 1.854 m (6\' 1" )   Wt 80.3 kg   SpO2 98%   BMI 23.35 kg/m   Physical Exam Vitals and nursing note reviewed.  Constitutional:      Appearance: Normal appearance. He is well-developed.  HENT:     Head: Normocephalic and atraumatic.  Eyes:     Extraocular Movements: Extraocular movements intact.     Conjunctiva/sclera: Conjunctivae normal.     Pupils: Pupils are equal, round, and reactive to light.  Cardiovascular:     Rate and Rhythm: Normal rate and regular rhythm.     Heart sounds: No murmur.  Pulmonary:     Effort: Pulmonary effort is normal. No respiratory distress.     Breath sounds: Normal breath sounds.  Abdominal:     Palpations: Abdomen is soft.     Tenderness: There is no abdominal tenderness.  Musculoskeletal:        General: Normal range of motion.     Cervical back: Normal range of motion and neck supple.  Skin:    General: Skin is warm and dry.     Capillary Refill: Capillary refill takes less than 2 seconds.  Neurological:      General: No focal deficit present.     Mental Status: He is alert and oriented to person, place, and time.     ED Results / Procedures / Treatments   Labs (all labs ordered are listed, but only abnormal results are displayed) Labs Reviewed  BASIC METABOLIC PANEL  CBC  TROPONIN I (HIGH SENSITIVITY)  TROPONIN I (HIGH SENSITIVITY)    EKG EKG Interpretation  Date/Time:  Thursday Nov 23 2019 08:55:36 EDT Ventricular Rate:  85 PR Interval:  138 QRS Duration: 96  QT Interval:  372 QTC Calculation: 443 R Axis:   84 Text Interpretation: Sinus rhythm Consider right atrial enlargement Confirmed by Vanetta Mulders [phone]) on 11/23/2019 10:37:18 AM   Radiology DG Chest 2 View  Result Date: 11/23/2019 CLINICAL DATA:  Chest pain EXAM: CHEST - 2 VIEW COMPARISON:  None. FINDINGS: Findings consistent with apical emphysema. Overall lung volume normal to slightly increased. Heart size and vascularity normal. Lungs are clear. No infiltrate effusion or mass. IMPRESSION: No acute abnormality.  Findings suggestive of apical emphysema. Electronically Signed   By: Marlan Palau M.D.   On: 11/23/2019 09:17    Procedures Procedures (including critical care time)  Medications Ordered in ED Medications  sodium chloride flush (NS) 0.9 % injection 3 mL (has no administration in time range)  ondansetron (ZOFRAN-ODT) disintegrating tablet 4 mg (4 mg Oral Given 11/23/19 1039)    ED Course  I have reviewed the triage vital signs and the nursing notes.  Pertinent labs & imaging results that were available during my care of the patient were reviewed by me and considered in my medical decision making (see chart for details).    MDM Rules/Calculators/A&P                      Symptoms seem to be consistent with muscle spasm left anterior chest.  Was actually able to observe some of the spasm in the muscle in that area.  Exact explanation for the nausea and vomiting not clear at this point.  CBC without any  acute findings.  Basic metabolic panel and troponin pending.  EKG without any acute findings.  Chest x-ray without any acute findings.  Patient's lab work-up without any acute findings.  CBC normal basic electrolytes normal troponin very normal.  Chest x-ray without any acute findings there is some evidence of emphysema.  Patient stable for discharge home   Final Clinical Impression(s) / ED Diagnoses Final diagnoses:  Atypical chest pain  Non-intractable vomiting with nausea, unspecified vomiting type    Rx / DC Orders ED Discharge Orders         Ordered    ondansetron (ZOFRAN ODT) 4 MG disintegrating tablet  Every 8 hours PRN     11/23/19 1034           Vanetta Mulders, MD 11/23/19 1040

## 2019-11-23 NOTE — ED Triage Notes (Signed)
Patient c/o "spasm in the left breast muscle" area; started Wednesday and is now more constant; stated unrelieved by baclofen and gabapentin that he took earlier today; patient stated he had nausea as well and and he threw up the meds. Pt endorsed the goes to methadone clinic as well

## 2019-12-13 ENCOUNTER — Telehealth: Payer: Self-pay

## 2019-12-13 NOTE — Telephone Encounter (Signed)
rx completed

## 2019-12-13 NOTE — Telephone Encounter (Signed)
Patient called stating needs a script sent to [facility] for a new AFO brace for leg.

## 2019-12-13 NOTE — Telephone Encounter (Signed)
Faxed to [facility]

## 2020-01-13 ENCOUNTER — Other Ambulatory Visit: Payer: Self-pay | Admitting: Physical Medicine & Rehabilitation

## 2020-01-13 DIAGNOSIS — G894 Chronic pain syndrome: Secondary | ICD-10-CM

## 2020-01-13 DIAGNOSIS — Z5181 Encounter for therapeutic drug level monitoring: Secondary | ICD-10-CM

## 2020-01-13 DIAGNOSIS — S14145S Brown-Sequard syndrome at C5 level of cervical spinal cord, sequela: Secondary | ICD-10-CM

## 2020-01-13 DIAGNOSIS — Z79899 Other long term (current) drug therapy: Secondary | ICD-10-CM

## 2020-01-13 DIAGNOSIS — G8111 Spastic hemiplegia affecting right dominant side: Secondary | ICD-10-CM

## 2020-02-21 ENCOUNTER — Other Ambulatory Visit: Payer: Self-pay

## 2020-02-21 ENCOUNTER — Encounter: Payer: Self-pay | Admitting: Physical Medicine & Rehabilitation

## 2020-02-21 ENCOUNTER — Encounter: Payer: Worker's Compensation | Admitting: Physical Medicine & Rehabilitation

## 2020-02-21 VITALS — BP 143/82 | HR 113 | Temp 98.4°F | Ht 73.0 in | Wt 185.0 lb

## 2020-02-26 ENCOUNTER — Telehealth: Payer: Self-pay | Admitting: Physical Therapy

## 2020-02-26 NOTE — Telephone Encounter (Signed)
Spoke to [name] and he reported his FCE/worker's comp paperwork has been filled out and he is good for now. He will call if he has other questions.

## 2020-02-28 ENCOUNTER — Telehealth: Payer: Self-pay

## 2020-02-28 NOTE — Telephone Encounter (Signed)
LEFT MESSAGE FOR DAN FUNK ATTY [phone] - HE IS TO GIVE INSTRUCTIONS ON HOW TO FILE BOTOX PRIOR AUTH WITH [place] [employer]

## 2020-02-29 ENCOUNTER — Telehealth: Payer: Self-pay | Admitting: Physical Medicine & Rehabilitation

## 2020-02-29 ENCOUNTER — Telehealth: Payer: Self-pay | Admitting: Physical Therapy

## 2020-02-29 NOTE — Telephone Encounter (Signed)
Spoke to Billy Kelley and he will drop off Workers Comp  form for me to review.

## 2020-02-29 NOTE — Telephone Encounter (Signed)
lvm to obtain botox auth for patient

## 2020-03-01 NOTE — Telephone Encounter (Signed)
Left Message for Annie Paras [phone] Patient needs botox prior auth.

## 2020-03-05 ENCOUNTER — Telehealth: Payer: Self-pay | Admitting: Physical Therapy

## 2020-03-05 NOTE — Telephone Encounter (Signed)
Spoke to Mr Billy Kelley and reported that I via e mail spoke to Dr. Riley Kill about the form and Dr. Riley Kill reported he had filled out the section in question and faxed it off to [facility]. Mr Billy Kelley reports he would pick up the form at our office tomorrow.

## 2020-03-26 ENCOUNTER — Telehealth: Payer: Self-pay | Admitting: Physical Therapy

## 2020-03-26 NOTE — Telephone Encounter (Signed)
Asked Billy Kelley if he was going to pick up his copy of his WC disability report and he said to shred the report.

## 2020-04-03 ENCOUNTER — Encounter: Payer: Self-pay | Admitting: Physical Medicine & Rehabilitation

## 2020-04-03 ENCOUNTER — Encounter: Payer: Medicare HMO | Attending: Physical Medicine & Rehabilitation | Admitting: Physical Medicine & Rehabilitation

## 2020-04-03 ENCOUNTER — Other Ambulatory Visit: Payer: Self-pay

## 2020-04-03 VITALS — BP 126/82 | HR 102 | Temp 98.8°F | Ht 73.0 in | Wt 189.0 lb

## 2020-04-03 DIAGNOSIS — G894 Chronic pain syndrome: Secondary | ICD-10-CM | POA: Insufficient documentation

## 2020-04-03 DIAGNOSIS — G8111 Spastic hemiplegia affecting right dominant side: Secondary | ICD-10-CM | POA: Diagnosis present

## 2020-04-03 MED ORDER — METHADONE HCL 10 MG PO TABS: 10.0000 mg | ORAL_TABLET | Freq: Three times a day (TID) | ORAL | 0 refills | Status: DC

## 2020-04-03 NOTE — Progress Notes (Signed)
Botox Injection for spasticity of upper extremity using needle EMG guidance Indication: Right spastic hemiparesis (HCC) - Plan: methadone (DOLOPHINE) 10 MG tablet  Chronic pain syndrome - Plan: methadone (DOLOPHINE) 10 MG tablet   Dilution: 100 Units/ml        Total Units Injected: 500  Indication: Severe spasticity which interferes with ADL,mobility and/or  hygiene and is unresponsive to medication management and other conservative care Informed consent was obtained after describing risks and benefits of the procedure with the patient. This includes bleeding, bruising, infection, excessive weakness, or medication side effects. A REMS form is on file and signed.  Needle: 69mm injectable monopolar needle electrode    Number of units per muscle Pectoralis Major 0 units Pectoralis Minor 0 units Biceps 0 units Brachioradialis 0 units FCR 50 units FCU 50 units FDS 125 units FDP 125 units FPL 0 units Palmaris Longus 50 units Pronator Teres 0 units Pronator Quadratus 0 units Lumbricals 150 units All injections were done after obtaining appropriate EMG activity and after negative drawback for blood. The patient tolerated the procedure well. Post procedure instructions were given. Return in about 1 month (around 05/03/2020) for NP visit.   I told Arvin that I will be willing to wean his methadone off.  He is going to the methadone clinic who told him they will not treat him for "pain".  We will begin at 10 mg 3 times daily for 2 weeks.  He will call me near the end of those 2 weeks and if possible we will decrease it further.  I will have him see nurse practitioner in about a month for continued weaning of this.

## 2020-04-03 NOTE — Patient Instructions (Signed)
PLEASE FEEL FREE TO CALL OUR OFFICE WITH ANY PROBLEMS OR QUESTIONS ([phone])      

## 2020-04-10 ENCOUNTER — Encounter: Payer: Medicare HMO | Admitting: Physical Medicine & Rehabilitation

## 2020-04-17 ENCOUNTER — Telehealth: Payer: Self-pay

## 2020-04-17 DIAGNOSIS — G894 Chronic pain syndrome: Secondary | ICD-10-CM

## 2020-04-17 DIAGNOSIS — G8111 Spastic hemiplegia affecting right dominant side: Secondary | ICD-10-CM

## 2020-04-17 MED ORDER — METHADONE HCL 10 MG PO TABS: 10.0000 mg | ORAL_TABLET | Freq: Two times a day (BID) | ORAL | 0 refills | Status: DC

## 2020-04-17 NOTE — Telephone Encounter (Signed)
Reduced methadone to 10mg  q12. Gave him #60. Will reduce further after this rx

## 2020-05-01 ENCOUNTER — Encounter: Payer: Worker's Compensation | Admitting: Registered Nurse

## 2020-05-08 ENCOUNTER — Encounter: Payer: Worker's Compensation | Attending: Registered Nurse | Admitting: Registered Nurse

## 2020-05-08 ENCOUNTER — Other Ambulatory Visit: Payer: Self-pay

## 2020-05-08 ENCOUNTER — Encounter: Payer: Self-pay | Admitting: Registered Nurse

## 2020-05-08 VITALS — BP 138/94 | HR 92 | Temp 98.7°F | Wt 200.0 lb

## 2020-05-08 DIAGNOSIS — G894 Chronic pain syndrome: Secondary | ICD-10-CM | POA: Diagnosis not present

## 2020-05-08 DIAGNOSIS — Z79899 Other long term (current) drug therapy: Secondary | ICD-10-CM | POA: Diagnosis present

## 2020-05-08 DIAGNOSIS — G8111 Spastic hemiplegia affecting right dominant side: Secondary | ICD-10-CM | POA: Insufficient documentation

## 2020-05-08 DIAGNOSIS — Z5181 Encounter for therapeutic drug level monitoring: Secondary | ICD-10-CM | POA: Insufficient documentation

## 2020-05-08 DIAGNOSIS — M47816 Spondylosis without myelopathy or radiculopathy, lumbar region: Secondary | ICD-10-CM | POA: Insufficient documentation

## 2020-05-08 MED ORDER — METHADONE HCL 10 MG PO TABS: 10.0000 mg | ORAL_TABLET | Freq: Two times a day (BID) | ORAL | 0 refills | Status: AC

## 2020-05-08 NOTE — Progress Notes (Signed)
Subjective:    Patient ID: Billy Kelley, male    DOB: 01-25-1976, 44 y.o.   MRN: [id number]  HPI: Billy Kelley is a 44 y.o. male who returns for follow up appointment for chronic pain and weaning of his methadone. Dr Riley Kill note was reviewed.  He states his pain is located in his right arm and lower back pain.Also reports he's having increase intensity of pain and his current dosage of methadone not controlling his pain. He states "he wanted to speak with Dr Riley Kill regarding his pain, Dr Riley Kill note was reviewed with him and this provider spoke with Dr Riley Kill, Dr Riley Kill states " Billy Kelley orders of weaning off his methadone remains he will continue current prescription this month and the methadone will be decreased on his next scheduled appointment. Billy Kelley began crying and stating "he doesn't understand why he's coming to our office and why aren't we helping him". This provider tried to explain to Billy Kelley the reason for visit and reminded him of the conversation he had with Dr Riley Kill, he verbalizes understanding. This provider asked Billy Kelley if he wanted to resume his treatment with the Methadone clinic, he stated he wasn't able due to financial hardship. This provider asked for Kenn File to speak with [name] and emotional support was given.  He rates his pain 9. His current exercise regime is walking and performing stretching exercises.  [name] Morphine equivalent is 69.00 MME.  UDS ordered today.   Pain Inventory Average Pain 9 Pain Right Now 9 My pain is constant, sharp, tingling and aching  In the last 24 hours, has pain interfered with the following? General activity 2 Relation with others 2 Enjoyment of life 2 What TIME of day is your pain at its worst? daytime Sleep (in general) Poor  Pain is worse with: walking and bending Pain improves with: medication and injections Relief from Meds: 8  Family History  Problem Relation Age of  Onset  . Hypertension Mother   . Hyperlipidemia Mother   . Cancer Mother 28       endometrial   . Heart disease Father        CHF   . Diabetes Neg Hx    Social History   Socioeconomic History  . Marital status: Single    Spouse name: Not on file  . Number of children: 1   . Years of education: Not on file  . Highest education level: Not on file  Occupational History  . Occupation: Disability     Comment: SSI   Tobacco Use  . Smoking status: Former Smoker    Packs/day: 0.50    Years: 20.00    Pack years: 10.00    Types: Cigarettes    Quit date: 06/14/2016    Years since quitting: 3.9  . Smokeless tobacco: Never Used  Substance and Sexual Activity  . Alcohol use: No  . Drug use: Yes    Types: Marijuana    Comment: 2-3 times week   . Sexual activity: Yes    Birth control/protection: Condom  Other Topics Concern  . Not on file  Social History Narrative   Lives with mom.   Moved from [place] most recently. Also live in [place] in [place].          Social Determinants of Health   Financial Resource Strain:   . Difficulty of Paying Living Expenses: Not on file  Food Insecurity:   . Worried About  Running Out of Food in the Last Year: Not on file  . Ran Out of Food in the Last Year: Not on file  Transportation Needs:   . Lack of Transportation (Medical): Not on file  . Lack of Transportation (Non-Medical): Not on file  Physical Activity:   . Days of Exercise per Week: Not on file  . Minutes of Exercise per Session: Not on file  Stress:   . Feeling of Stress : Not on file  Social Connections:   . Frequency of Communication with Friends and Family: Not on file  . Frequency of Social Gatherings with Friends and Family: Not on file  . Attends Religious Services: Not on file  . Active Member of Clubs or Organizations: Not on file  . Attends Banker Meetings: Not on file  . Marital Status: Not on file   Past Surgical History:  Procedure Laterality  Date  . BRAIN SURGERY    . HERNIA REPAIR Bilateral 1976  . INCISION AND DRAINAGE Left 04/19/2018   Procedure: INCISION AND DRAINAGE LEFT INDEX FINGER;  Surgeon: Cindee Salt, MD;  Location: [facility];  Service: Orthopedics;  Laterality: Left;  . INCISION AND DRAINAGE ABSCESS Left 04/05/2018   Procedure: INCISION AND DRAINAGE LEFT INDEX FINGER;  Surgeon: Cindee Salt, MD;  Location: [facility];  Service: Orthopedics;  Laterality: Left;  . spinal cord fusion  2005    C4-C6    Past Surgical History:  Procedure Laterality Date  . BRAIN SURGERY    . HERNIA REPAIR Bilateral 1976  . INCISION AND DRAINAGE Left 04/19/2018   Procedure: INCISION AND DRAINAGE LEFT INDEX FINGER;  Surgeon: Cindee Salt, MD;  Location: [facility];  Service: Orthopedics;  Laterality: Left;  . INCISION AND DRAINAGE ABSCESS Left 04/05/2018   Procedure: INCISION AND DRAINAGE LEFT INDEX FINGER;  Surgeon: Cindee Salt, MD;  Location: [facility];  Service: Orthopedics;  Laterality: Left;  . spinal cord fusion  2005    C4-C6    Past Medical History:  Diagnosis Date  . Kidney stone 2012   passed with fluids   . 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, Holiday representative, Statistician, shattered C5 and fused C4-C6    BP (!) 138/94   Pulse 92   Temp 98.7 F (37.1 C)   Wt 200 lb (90.7 kg)   SpO2 96%   BMI 26.39 kg/m   Opioid Risk Score:   Fall Risk Score:  `1  Depression screen PHQ 2/9  Depression screen [facility] 2/9 04/12/2018 08/02/2017 08/26/2016 06/01/2016 04/01/2016 10/23/2015 09/30/2015  Decreased Interest 0 0 2 2 2 2 1   Down, Depressed, Hopeless 0 0 0 2 2 2 1   PHQ - 2 Score 0 0 2 4 4 4 2   Altered sleeping - - 0 - - 1 -  Tired, decreased energy - - 0 - - 3 -  Change in appetite - - 0 - - 0 -  Feeling bad or failure about yourself  - - 0 - - 1 -  Trouble concentrating - - 0 - - 3 -  Moving slowly or fidgety/restless - - 0 - - 0 -  Suicidal thoughts  - - 0 - - 0 -  PHQ-9 Score - - 2 - - 12 -  Difficult doing work/chores - - - - - Very difficult -   Review of Systems  Musculoskeletal: Positive for back pain and  gait problem.       Lowe back & right arm pain  All other systems reviewed and are negative.      Objective:   Physical Exam Vitals and nursing note reviewed.  Constitutional:      Appearance: Normal appearance.  Cardiovascular:     Rate and Rhythm: Normal rate and regular rhythm.     Pulses: Normal pulses.     Heart sounds: Normal heart sounds.  Pulmonary:     Effort: Pulmonary effort is normal.     Breath sounds: Normal breath sounds.  Musculoskeletal:     Cervical back: Normal range of motion and neck supple.     Comments: Normal Muscle Bulk and Muscle Testing Reveals:  Upper Extremities: Right: Decreased ROM 45 Degrees and Muscle Strength 5/5 Left Upper Extremity: Full ROM and Muscle Strength 5/5  Lumbar Paraspinal Tenderness: L-3-L-5 Lower Extremities: Right Decreased ROM and Muscle Strength 5/5 Left Lower Extremity: Full ROM and Muscle Strength 5/5 Arises from Table slowly using cane for support Antalgic  Gait   Skin:    General: Skin is warm and dry.  Neurological:     Mental Status: He is alert and oriented to person, place, and time.  Psychiatric:        Mood and Affect: Mood normal.        Behavior: Behavior normal.           Assessment & Plan:  1. C5 fx/SCI with [name] Presentation: 05/08/2020 Refilled: Methadone 10 mg one tablet every 12 hours as needed #60. We will continue the opioid monitoring program, this consists of regular clinic visits, examinations, urine drug screen, pill counts as well as use of [place] Controlled Substance Reporting system. A 12 month History has been reviewed on the [place] Controlled Substance Reporting System on 05/08/2020 Continue Gabapentin for neuropathic Pain 2. Spastic right hemiparesis: Continue Baclofen as prescribed. Continue to  monitor. 05/08/2020. 3. Neurogenic bowel: Continue with Bowel Regimen. Continue to Monitor. 05/08/2020 4. Reactive depression with anxiety, ?PTSD. Continue Prozac. Psychiatry Following: [name] at the [facility]. 05/08/2020  40 minutes of face to face patient care time was spent during this visit. All questions were encouraged and answered.    F/U in 1 month

## 2020-05-10 ENCOUNTER — Telehealth: Payer: Self-pay

## 2020-05-10 NOTE — Telephone Encounter (Signed)
When we have a name, i'll make a referral

## 2020-05-10 NOTE — Telephone Encounter (Signed)
Patient called and states he is using Psych for PTDS.  For his [employer] to now pick up he will need referral from [facility]-- asking that we contact him to get name and fax number for [facility] to do this as he is not using work comp now

## 2020-05-13 ENCOUNTER — Telehealth: Payer: Self-pay | Admitting: *Deleted

## 2020-05-13 LAB — TOXASSURE SELECT,+ANTIDEPR,UR

## 2020-05-13 NOTE — Telephone Encounter (Signed)
Urine drug screen is positive for prescribed methadone but is also positive for Fentanyl and its metabolite which is not a prescribed medication.

## 2020-05-14 NOTE — Telephone Encounter (Signed)
Left VM for Mr Doo to call me back so that we can discuss his desired approach to the methodone (wean or not wean).. I have written the discharge letter and it will be mailed and sent through MyChart. [place] Mail lettter will have a list of surrounding [facility] clinics included.

## 2020-05-14 NOTE — Telephone Encounter (Signed)
I am forced to discharge him from this practice given prior events, warnings, failed drug tests, etc We have been MORE than lenient and forgiving given hi behavior. I will give him a 4 week taper of his methadone if he so chooses. I will not accept him back to this practice under any circumstances.

## 2020-05-14 NOTE — Telephone Encounter (Signed)
Mr Billy Kelley called back and he would like the wean down schedule.  I have done his letter and sent it out through Mychart and mail.  I told him we would send him the wean down schedule if you will write it out.

## 2020-05-15 NOTE — Telephone Encounter (Signed)
METHADONE TAPER USING 10MG  TABS  1/2 TAB IN AM AND FULL TAB IN PM X 1 WEEK  1/2 TAB IN AM AND 1/2 TAB IN PM X 1 WEEK  1/2 TAB IN PM  FOR WHATEVER AMOUNT OF TIME HE STILL HAS MEDICINE LEFT.........THEN OFF  IF HE WANTS TO FLIP-FLOP THE TAPER AND REDUCE IN PM FIRST, THAT IS FINE.   THIS IS LAST MEDICATION HE WILL RECEIVE HERE, LET'S BE CLEAR.

## 2020-05-15 NOTE — Telephone Encounter (Signed)
This was copied to [employer] letter and sent as well as mailed.

## 2020-05-21 ENCOUNTER — Other Ambulatory Visit: Payer: Self-pay | Admitting: Physical Medicine & Rehabilitation

## 2020-05-21 ENCOUNTER — Other Ambulatory Visit: Payer: Self-pay

## 2020-05-21 ENCOUNTER — Encounter (HOSPITAL_COMMUNITY): Payer: Self-pay | Admitting: Emergency Medicine

## 2020-05-21 ENCOUNTER — Telehealth: Payer: Self-pay

## 2020-05-21 ENCOUNTER — Emergency Department (HOSPITAL_COMMUNITY)
Admission: EM | Admit: 2020-05-21 | Discharge: 2020-05-22 | Disposition: A | Discharge status: Home / Self Care | Payer: Medicare HMO | Attending: Emergency Medicine | Admitting: Emergency Medicine

## 2020-05-21 DIAGNOSIS — Z87891 Personal history of nicotine dependence: Secondary | ICD-10-CM | POA: Insufficient documentation

## 2020-05-21 DIAGNOSIS — F22 Delusional disorders: Secondary | ICD-10-CM | POA: Diagnosis not present

## 2020-05-21 DIAGNOSIS — J3489 Other specified disorders of nose and nasal sinuses: Secondary | ICD-10-CM | POA: Diagnosis present

## 2020-05-21 NOTE — Telephone Encounter (Signed)
Patient called stating he wanted to speak to [place].

## 2020-05-21 NOTE — ED Triage Notes (Signed)
Pt st's his left nostril feels like his septum is trying to come out.  Pt st's "I've been picking it but I am afraid to pull it out" No obvious deformity to nose.  Pt denies injury to nose

## 2020-05-22 ENCOUNTER — Ambulatory Visit: Payer: Medicare HMO | Admitting: Physical Medicine & Rehabilitation

## 2020-05-22 NOTE — Telephone Encounter (Signed)
Billy Kelley has been deischarged from our practice and no further communication will be made.

## 2020-05-22 NOTE — ED Provider Notes (Signed)
[facility] EMERGENCY DEPARTMENT Provider Note   CSN: [id number] Arrival date & time: 05/21/20  2126   History Chief Complaint  Patient presents with  . Nose Problem    Billy Kelley is a 44 y.o. male.  The history is provided by the patient.  He has a history of cervical spine fracture with right spastic hemiparesis and comes in complaining that a bone in his nasal septum has shifted and has to come out.  The problem is on the left side of the nose.  He noticed it about 3 days ago.  He feels that he has to remove the bone and has been picking at it.  Past Medical History:  Diagnosis Date  . Kidney stone 2012   passed with fluids   . 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 Spondylosis without myelopathy or radiculopathy, lumbar region 04/12/2018  . Chronic pain syndrome 12/08/2017  . Erectile dysfunction 08/25/2016  . Dysphagia 07/08/2016  . Low back pain 03/08/2015  . Right low back pain 12/19/2014  . Fatigue 12/19/2014  . Neck muscle spasm 12/19/2014  . Allergic rhinitis 12/19/2014  . Neurogenic bowel 10/01/2014  . Right spastic hemiparesis (HCC) 08/22/2014  . 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 Neurogenic bladder 06/20/2014  . Low BP 03/22/2014  . Depression 03/09/2014  . Anxiety 03/09/2014  . Smoker 03/09/2014  . Closed fracture of C5-C7 level with incomplete spinal cord lesion     Past Surgical History:  Procedure Laterality Date  . BRAIN SURGERY    . HERNIA REPAIR Bilateral 1976  . INCISION AND DRAINAGE Left 04/19/2018   Procedure: INCISION AND DRAINAGE LEFT INDEX FINGER;  Surgeon: Cindee Salt, MD;  Location: [facility];  Service: Orthopedics;  Laterality: Left;  . INCISION AND DRAINAGE ABSCESS Left 04/05/2018   Procedure: INCISION AND DRAINAGE  LEFT INDEX FINGER;  Surgeon: Cindee Salt, MD;  Location: [facility];  Service: Orthopedics;  Laterality: Left;  . spinal cord fusion  2005    C4-C6        Family History  Problem Relation Age of Onset  . Hypertension Mother   . Hyperlipidemia Mother   . Cancer Mother 44       endometrial   . Heart disease Father        CHF   . Diabetes Neg Hx     Social History   Tobacco Use  . Smoking status: Former Smoker    Packs/day: 0.50    Years: 20.00    Pack years: 10.00    Types: Cigarettes    Quit date: 06/14/2016    Years since quitting: 3.9  . Smokeless tobacco: Never Used  Vaping Use  . Vaping Use: Every day  . Substances: Nicotine-salt  Substance Use Topics  . Alcohol use: No  . Drug use: Yes    Types: Marijuana    Comment: 2-3 times week - store brought    Home Medications Prior to Admission medications   Medication Sig Start Date End Date Taking? Authorizing Provider  baclofen (LIORESAL) 20 MG tablet TAKE 1 TABLET BY MOUTH FOUR TIMES DAILY 04/14/19   Ranelle Oyster, MD  clonazePAM (KLONOPIN) 0.5 MG tablet TAKE 1 TABLET(0.5 MG) BY MOUTH THREE TIMES DAILY AS NEEDED 03/09/18  Ranelle Oyster, MD  diclofenac (VOLTAREN) 75 MG EC tablet Take 1 tablet (75 mg total) by mouth 2 (two) times daily with a meal. 05/25/18   Ranelle Oyster, MD  FLUoxetine (PROZAC) 20 MG capsule TAKE 3 CAPSULES(60 MG) BY MOUTH DAILY 09/06/19   Ranelle Oyster, MD  gabapentin (NEURONTIN) 600 MG tablet TAKE 1 TABLET(600 MG) BY MOUTH FIVE TIMES DAILY 01/16/20   Ranelle Oyster, MD  HYDROcodone-acetaminophen (NORCO) 5-325 MG tablet Take 1 tablet by mouth every 6 (six) hours as needed. Patient not taking: Reported on 05/08/2020 04/19/18   Cindee Salt, MD  methadone (DOLOPHINE) 10 MG tablet Take 1 tablet (10 mg total) by mouth every 12 (twelve) hours. 05/08/20   Jones Bales, NP  ondansetron (ZOFRAN ODT) 4 MG disintegrating tablet Take 1 tablet (4 mg total) by mouth every 8 (eight)  hours as needed for nausea or vomiting. 11/23/19   Vanetta Mulders, MD  sildenafil (VIAGRA) 100 MG tablet TAKE 1/2 TO 1 TABLET BY MOUTH DAILY AS NEEDED FOR ERECTILE DYSFUNCTION 04/12/18   Ranelle Oyster, MD    Allergies    Patient has no known allergies.  Review of Systems   Review of Systems  All other systems reviewed and are negative.   Physical Exam Updated Vital Signs BP (!) 127/99 (BP Location: Left Arm)   Pulse (!) 109   Temp 98.4 F (36.9 C) (Oral)   Resp 18   Ht 6\' 1"  (1.854 m)   Wt 90.7 kg   SpO2 97%   BMI 26.39 kg/m   Physical Exam Vitals and nursing note reviewed.   44 year old male, resting comfortably and in no acute distress. Vital signs are significant for mildly elevated blood pressure and mildly elevated heart rate. Oxygen saturation is 97%, which is normal. Head is normocephalic and atraumatic. PERRLA, EOMI. Oropharynx is clear.  Examination of the nasal cavity shows septum is in the midline, slight erythema of the mucosa on the left side which is felt to be related to the patient picking at it.  Turbinates appear normal. Neck is nontender and supple without adenopathy or JVD. Back is nontender and there is no CVA tenderness. Lungs are clear without rales, wheezes, or rhonchi. Chest is nontender. Heart has regular rate and rhythm without murmur. Abdomen is soft, flat, nontender without masses or hepatosplenomegaly and peristalsis is normoactive. Extremities have no cyanosis or edema, full range of motion is present. Skin is warm and dry without rash. Neurologic: Awake and alert but fixated on his sense that there is something wrong with his nose.  ED Results / Procedures / Treatments    Procedures Procedures   Medications Ordered in ED Medications - No data to display  ED Course  I have reviewed the triage vital signs and the nursing notes.  MDM Rules/Calculators/A&P Sensation there is an abnormality of his septum, not matched by physical exam.   He is constantly picking at the nose and I am concerned that he will actually cause a problem.  I attempted to place a Merocel pack to try to protect the area that he is picking at.  However, patient was uncooperative and pulled the packing out before I could even add water to it.  He would not allow me to continue.  He will be referred to ENT for follow-up.  We will also give mental health referral.  Old records are reviewed, and he has no relevant past visits.  Final Clinical Impression(s) / ED  Diagnoses Final diagnoses:  Somatic type delusional disorder [facility])    Rx / DC Orders ED Discharge Orders    None       Dione Booze, MD 05/22/20 [phone]

## 2020-05-22 NOTE — Discharge Instructions (Signed)
I did not see anything wrong with your nose, but am referring you to a nose specialist for confirmation. If the nose specialist agrees that there is nothing wrong with your nose, then you would need to see a mental health professional.

## 2020-05-24 ENCOUNTER — Other Ambulatory Visit: Payer: Self-pay | Admitting: *Deleted

## 2020-05-24 ENCOUNTER — Encounter: Payer: Self-pay | Admitting: *Deleted

## 2020-05-24 DIAGNOSIS — Z5181 Encounter for therapeutic drug level monitoring: Secondary | ICD-10-CM

## 2020-05-24 DIAGNOSIS — G8111 Spastic hemiplegia affecting right dominant side: Secondary | ICD-10-CM

## 2020-05-24 DIAGNOSIS — S14145S Brown-Sequard syndrome at C5 level of cervical spinal cord, sequela: Secondary | ICD-10-CM

## 2020-05-24 DIAGNOSIS — G894 Chronic pain syndrome: Secondary | ICD-10-CM

## 2020-05-24 DIAGNOSIS — Z79899 Other long term (current) drug therapy: Secondary | ICD-10-CM

## 2020-05-24 MED ORDER — BACLOFEN 20 MG PO TABS: 20.0000 mg | ORAL_TABLET | Freq: Four times a day (QID) | ORAL | 3 refills | Status: AC

## 2020-05-24 MED ORDER — GABAPENTIN 600 MG PO TABS: ORAL_TABLET | ORAL | 3 refills | Status: AC

## 2020-07-17 IMAGING — MR MR LUMBAR SPINE W/O CM
5 series · 32 of 48 positions shown · non-contrast
Comparison: Prior radiographs from 12/19/2014.

CLINICAL DATA: Initial evaluation for chronic back pain with
extension into the right lower extremity.

EXAM:
MRI LUMBAR SPINE WITHOUT CONTRAST
TECHNIQUE: Multiplanar, multisequence MR imaging of the lumbar spine was
performed. No intravenous contrast was administered.

[Series 3: T2 · sagittal · 4.0mm · 0.49mm/px · 6 of 12 slices shown (1 of 2)]
[im 1/12]
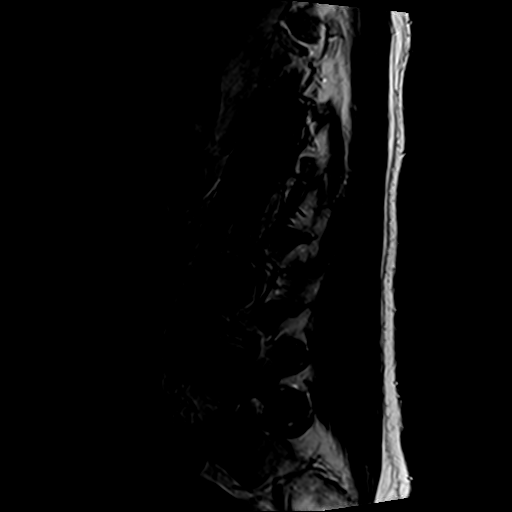
[im 3/12]
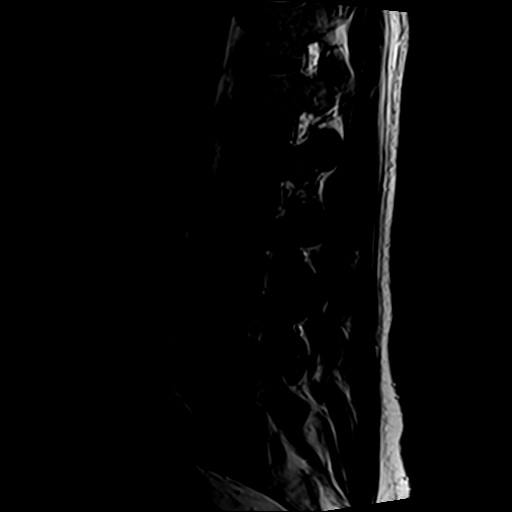
[im 5/12]
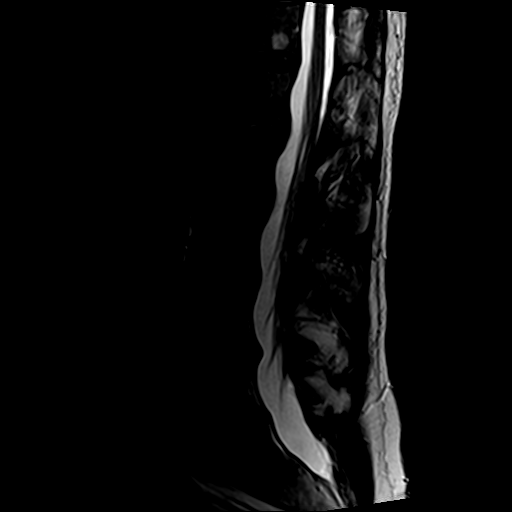
[im 7/12]
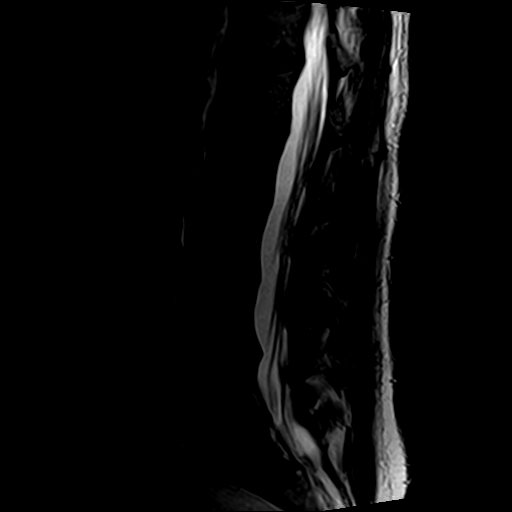
[im 9/12]
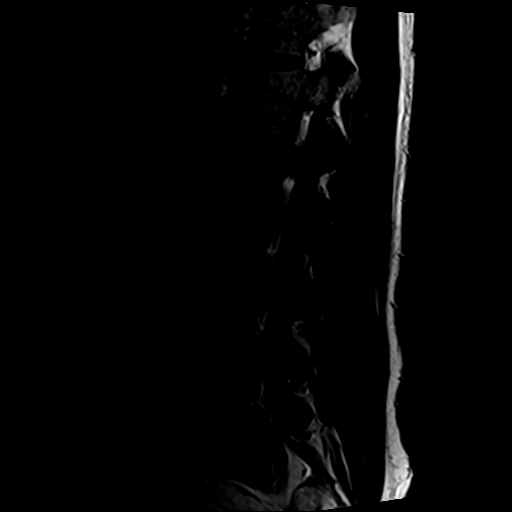
[im 12/12]
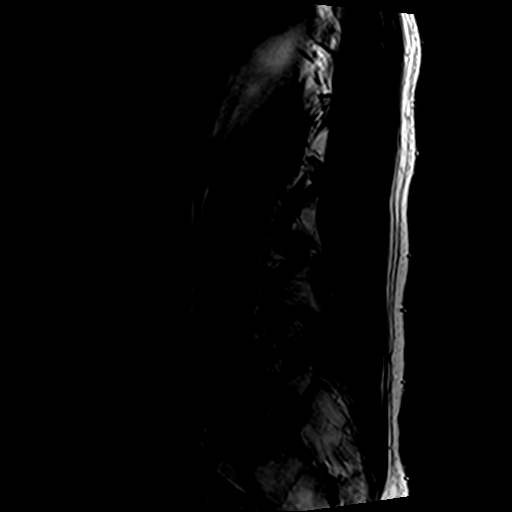

[Series 4: T1 · sagittal · 4.0mm · 0.49mm/px · 5 of 12 slices shown (1 of 2)]
[im 1/12]
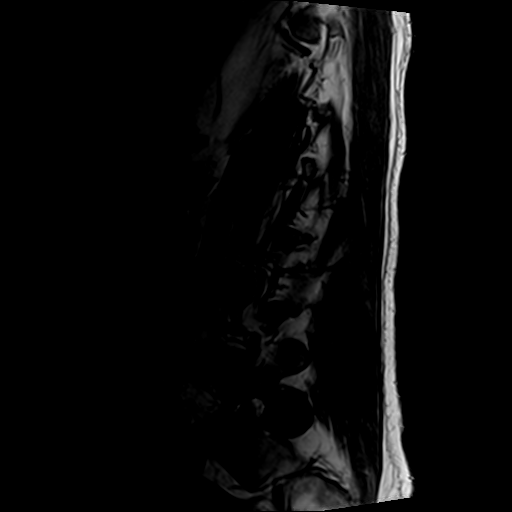
[im 3/12]
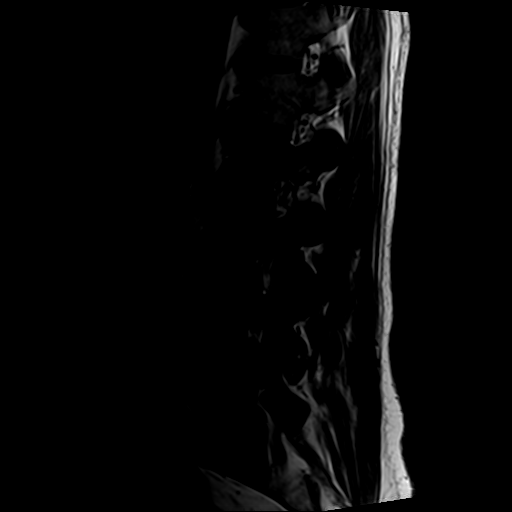
[im 6/12]
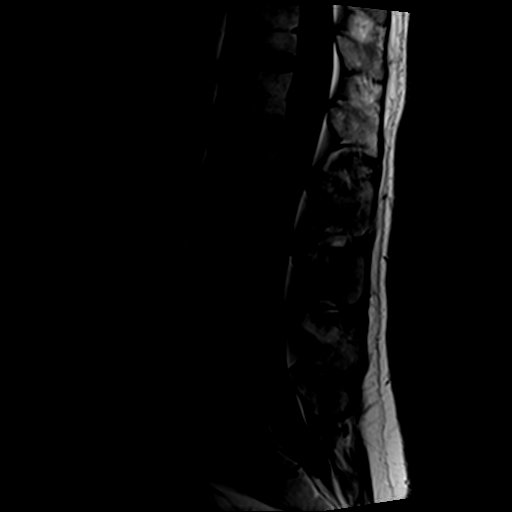
[im 9/12]
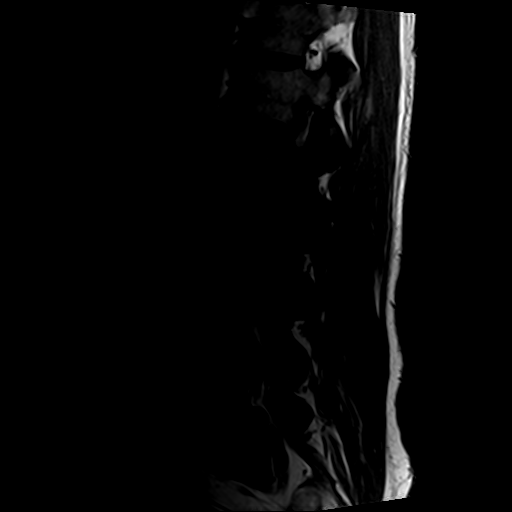
[im 12/12]
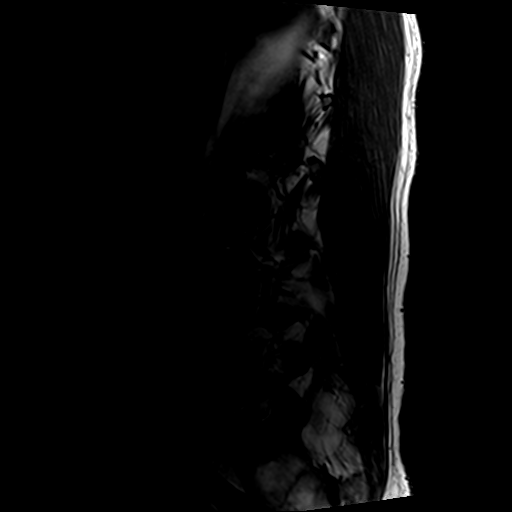

[Series 5: STIR · sagittal · 4.0mm · 0.49mm/px · 1 of 12 slices shown]
[im 1/12]
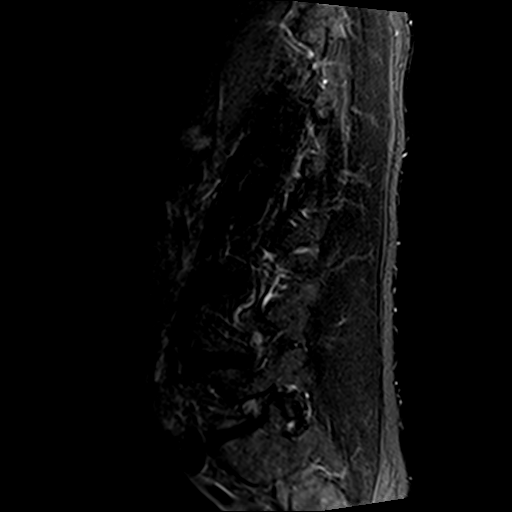

[Series 6: T2 · axial · 4.0mm · 0.70mm/px · z∈[-78,+137]mm · 10 of 38 slices shown (2 of 2)]
[im 3/38]
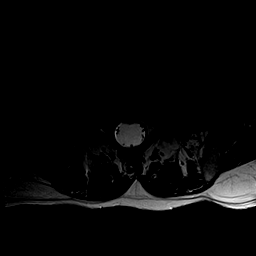
[im 5/38]
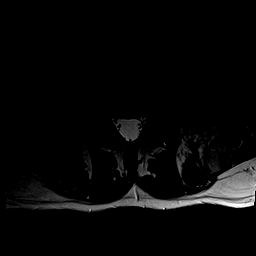
[im 8/38]
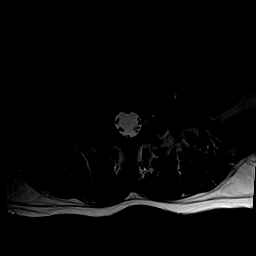
[im 13/38]
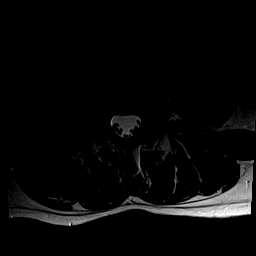
[im 18/38]
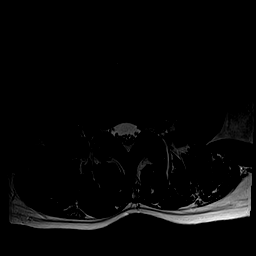
[im 20/38]
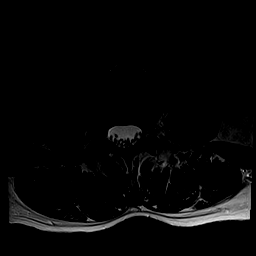
[im 23/38]
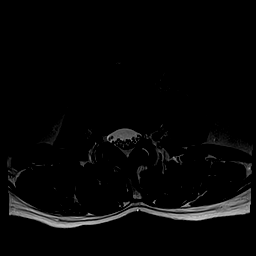
[im 28/38]
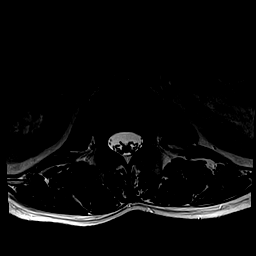
[im 33/38]
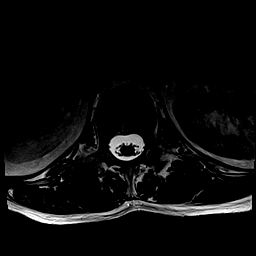
[im 38/38]
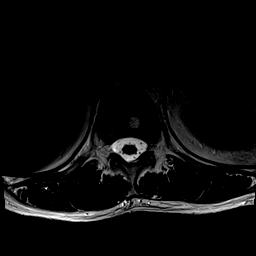

[Series 7: T1 · axial · 4.0mm · 0.70mm/px · z∈[-78,+137]mm · 10 of 38 slices shown (2 of 2)]
[im 3/38]
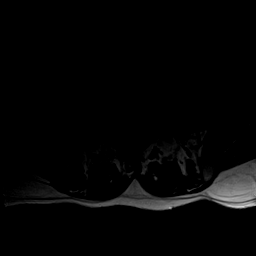
[im 5/38]
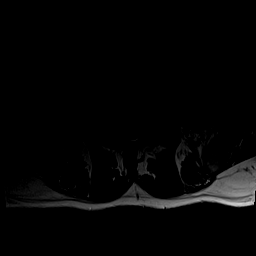
[im 8/38]
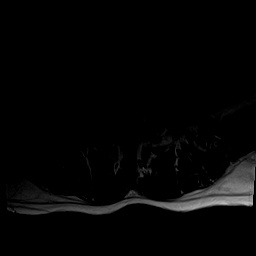
[im 13/38]
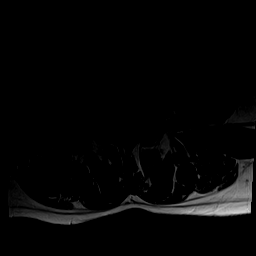
[im 18/38]
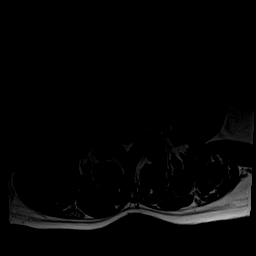
[im 20/38]
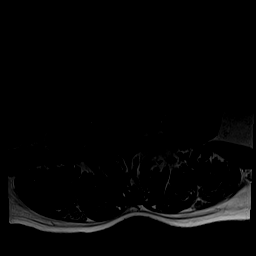
[im 23/38]
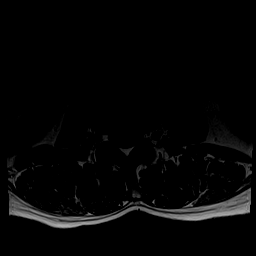
[im 28/38]
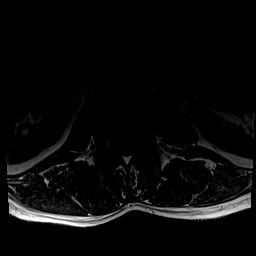
[im 33/38]
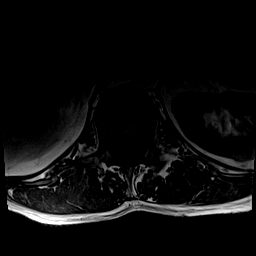
[im 38/38]
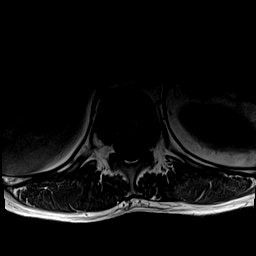

[32 of 48 positions shown; findings below may reference images not displayed]

FINDINGS: Segmentation: Normal segmentation. Lowest well-formed disc labeled
the L5-S1 level.

Alignment: Mild dextroscoliosis with straightening of the normal
lumbar lordosis. 3 mm retrolisthesis of L4 on L5.

Vertebrae: Vertebral body heights maintained without evidence for
acute or chronic fracture. Bone marrow signal intensity within
normal limits. Subcentimeter benign hemangioma noted within the T12
vertebral body. No other discrete or worrisome osseous lesions. Mild
reactive endplate changes noted about the L4-5 and L5-S1 interspace.
No other abnormal marrow edema.

Conus medullaris and cauda equina: Conus extends to the L2 level.
Conus and cauda equina appear normal.

Paraspinal and other soft tissues: Paraspinous soft tissues within
normal limits. Visualized visceral structures unremarkable.

Disc levels:

L1-2: Normal interspace. Mild bilateral facet hypertrophy. No
significant stenosis.

L2-3: Normal interspace. Mild facet and ligament flavum hypertrophy.
Trace bilateral joint effusions. No significant canal or neural
foraminal stenosis.

L3-4: Normal interspace. Mild to moderate facet and ligament flavum
hypertrophy. No significant canal or neural foraminal stenosis.

L4-5: 3 mm retrolisthesis. Diffuse disc bulge with disc desiccation
and intervertebral disc space narrowing. Mild reactive endplate
changes. Superimposed shallow left foraminal/extraforaminal disc
protrusion, closely approximating and potentially irritating the
exiting left L4 nerve root (series 7, image 27). Mild to moderate
facet and ligament flavum hypertrophy. Resultant mild canal with
bilateral lateral recess stenosis. Mild bilateral L4 foraminal
narrowing.

L5-S1: Mild diffuse disc bulge with disc desiccation. Mild reactive
endplate changes. Mild bilateral facet hypertrophy, left greater
than right. No significant canal or lateral recess stenosis. Mild to
moderate bilateral L5 foraminal narrowing, right worse than left.
IMPRESSION: 1. Shallow left foraminal/extraforaminal disc protrusion at L4-5,
closely approximating and potentially irritating the exiting left L4
nerve root.
2. Additional disc bulging with facet hypertrophy at L4-5 with
resultant mild canal and bilateral lateral recess narrowing, with
mild bilateral L4 foraminal stenosis.
3. Disc bulge with facet hypertrophy and reactive endplate changes
at L5-S1 with resultant mild to moderate bilateral L5 foraminal
narrowing, right worse than left.

## 2020-07-23 ENCOUNTER — Other Ambulatory Visit: Payer: Self-pay | Admitting: Physical Medicine & Rehabilitation

## 2020-10-05 ENCOUNTER — Emergency Department (HOSPITAL_COMMUNITY)
Admission: EM | Admit: 2020-10-05 | Discharge: 2020-10-05 | Disposition: A | Discharge status: Home / Self Care | Payer: Medicare HMO | Attending: Emergency Medicine | Admitting: Emergency Medicine

## 2020-10-05 ENCOUNTER — Emergency Department (HOSPITAL_COMMUNITY): Payer: Medicare HMO

## 2020-10-05 ENCOUNTER — Encounter (HOSPITAL_COMMUNITY): Payer: Self-pay

## 2020-10-05 DIAGNOSIS — R109 Unspecified abdominal pain: Secondary | ICD-10-CM | POA: Diagnosis present

## 2020-10-05 DIAGNOSIS — N200 Calculus of kidney: Secondary | ICD-10-CM | POA: Insufficient documentation

## 2020-10-05 DIAGNOSIS — Z87891 Personal history of nicotine dependence: Secondary | ICD-10-CM | POA: Insufficient documentation

## 2020-10-05 LAB — URINALYSIS, ROUTINE W REFLEX MICROSCOPIC
Bilirubin Urine: NEGATIVE
Glucose, UA: NEGATIVE mg/dL
Ketones, ur: NEGATIVE mg/dL
Leukocytes,Ua: NEGATIVE
Nitrite: NEGATIVE
Protein, ur: 100 mg/dL — AB
Specific Gravity, Urine: 1.01 (ref 1.005–1.030)
pH: 5 (ref 5.0–8.0)

## 2020-10-05 LAB — BASIC METABOLIC PANEL
Anion gap: 11 (ref 5–15)
BUN: 18 mg/dL (ref 6–20)
CO2: 25 mmol/L (ref 22–32)
Calcium: 9.4 mg/dL (ref 8.9–10.3)
Chloride: 99 mmol/L (ref 98–111)
Creatinine, Ser: 1.44 mg/dL — ABNORMAL HIGH (ref 0.61–1.24)
GFR, Estimated: >60 mL/min (ref >60)
Glucose, Bld: 104 mg/dL — ABNORMAL HIGH (ref 70–99)
Potassium: 3.5 mmol/L (ref 3.5–5.1)
Sodium: 135 mmol/L (ref 135–145)

## 2020-10-05 LAB — CBC
HCT: 37.6 % — ABNORMAL LOW (ref 39.0–52.0)
Hemoglobin: 12.7 g/dL — ABNORMAL LOW (ref 13.0–17.0)
MCH: 28.7 pg (ref 26.0–34.0)
MCHC: 33.8 g/dL (ref 30.0–36.0)
MCV: 85.1 fL (ref 80.0–100.0)
Platelets: 270 10*3/uL (ref 150–400)
RBC: 4.42 MIL/uL (ref 4.22–5.81)
RDW: 13.1 % (ref 11.5–15.5)
WBC: 8.3 10*3/uL (ref 4.0–10.5)
nRBC: 0 % (ref 0.0–0.2)

## 2020-10-05 MED ORDER — METHADONE HCL 10 MG/ML PO CONC
110.0000 mg | Freq: Once | ORAL | Status: AC
  Administered 2020-10-05: 110 mg via ORAL
  Filled 2020-10-05: qty 11

## 2020-10-05 MED ORDER — CELECOXIB 200 MG PO CAPS: 200.0000 mg | ORAL_CAPSULE | Freq: Two times a day (BID) | ORAL | 0 refills | Status: AC

## 2020-10-05 MED ORDER — ONDANSETRON HCL 4 MG/2ML IJ SOLN
4.0000 mg | Freq: Once | INTRAMUSCULAR | Status: AC
  Administered 2020-10-05: 4 mg via INTRAVENOUS
  Filled 2020-10-05: qty 2

## 2020-10-05 MED ORDER — TAMSULOSIN HCL 0.4 MG PO CAPS: 0.4000 mg | ORAL_CAPSULE | Freq: Two times a day (BID) | ORAL | 0 refills | Status: AC

## 2020-10-05 MED ORDER — ONDANSETRON HCL 4 MG PO TABS: 4.0000 mg | ORAL_TABLET | Freq: Three times a day (TID) | ORAL | 0 refills | Status: DC | PRN

## 2020-10-05 MED ORDER — HYDROMORPHONE HCL 1 MG/ML IJ SOLN
1.0000 mg | Freq: Once | INTRAMUSCULAR | Status: AC
  Administered 2020-10-05: 1 mg via INTRAVENOUS
  Filled 2020-10-05: qty 1

## 2020-10-05 MED ORDER — KETOROLAC TROMETHAMINE 30 MG/ML IJ SOLN
30.0000 mg | Freq: Once | INTRAMUSCULAR | Status: AC
  Administered 2020-10-05: 30 mg via INTRAVENOUS
  Filled 2020-10-05: qty 1

## 2020-10-05 MED ORDER — GABAPENTIN 300 MG PO CAPS
600.0000 mg | ORAL_CAPSULE | Freq: Once | ORAL | Status: AC
  Administered 2020-10-05: 600 mg via ORAL
  Filled 2020-10-05: qty 2

## 2020-10-05 MED ORDER — BACLOFEN 10 MG PO TABS
20.0000 mg | ORAL_TABLET | Freq: Once | ORAL | Status: AC
  Administered 2020-10-05: 20 mg via ORAL
  Filled 2020-10-05: qty 2

## 2020-10-05 NOTE — ED Notes (Signed)
Pt given urinal and asked to produce urine sample. Pt said he could not produce sample at this time.

## 2020-10-05 NOTE — Discharge Instructions (Signed)
Return to the ED immediately if you develop fever, uncontrolled pain or vomiting, or other concerns. ° °

## 2020-10-05 NOTE — ED Provider Notes (Signed)
[facility]-EMERGENCY DEPT Provider Note   CSN: [id number] Arrival date & time: 10/05/20  0540     History Chief Complaint  Patient presents with  . Flank Pain    Billy Kelley is a 45 y.o. male.  Patient presents to the emergency department with a chief complaint of right-sided flank pain.  He states pain started 2 days ago.  Initially started with painful urination.  He states that he was put on penicillin, and given a shot in his but had a clinic.  He states he was diagnosed with a UTI.  He states that now his symptoms are starting to feel similar to when he has had a kidney stone in the past.  He rates his pain is moderate to severe.  He has had difficulty getting comfortable.  He denies any fever.  He states that he saw blood in his urine today.  The history is provided by the patient. No language interpreter was used.       Past Medical History:  Diagnosis Date  . Kidney stone 2012   passed with fluids   . 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 Spondylosis without myelopathy or radiculopathy, lumbar region 04/12/2018  . Chronic pain syndrome 12/08/2017  . Erectile dysfunction 08/25/2016  . Dysphagia 07/08/2016  . Low back pain 03/08/2015  . Right low back pain 12/19/2014  . Fatigue 12/19/2014  . Neck muscle spasm 12/19/2014  . Allergic rhinitis 12/19/2014  . Neurogenic bowel 10/01/2014  . Right spastic hemiparesis (HCC) 08/22/2014  . 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 Neurogenic bladder 06/20/2014  . Low BP 03/22/2014  . Depression 03/09/2014  . Anxiety 03/09/2014  . Smoker 03/09/2014  . Closed fracture of C5-C7 level with incomplete spinal cord lesion     Past Surgical History:  Procedure Laterality Date  . BRAIN SURGERY    . HERNIA REPAIR Bilateral 1976   . INCISION AND DRAINAGE Left 04/19/2018   Procedure: INCISION AND DRAINAGE LEFT INDEX FINGER;  Surgeon: Cindee Salt, MD;  Location: [facility];  Service: Orthopedics;  Laterality: Left;  . INCISION AND DRAINAGE ABSCESS Left 04/05/2018   Procedure: INCISION AND DRAINAGE LEFT INDEX FINGER;  Surgeon: Cindee Salt, MD;  Location: [facility];  Service: Orthopedics;  Laterality: Left;  . spinal cord fusion  2005    C4-C6        Family History  Problem Relation Age of Onset  . Hypertension Mother   . Hyperlipidemia Mother   . Cancer Mother 26       endometrial   . Heart disease Father        CHF   . Diabetes Neg Hx     Social History   Tobacco Use  . Smoking status: Former Smoker    Packs/day: 0.50    Years: 20.00    Pack years: 10.00    Types: Cigarettes    Quit date: 06/14/2016    Years since quitting: 4.3  . Smokeless tobacco: Never Used  Vaping Use  . Vaping Use: Every day  . Substances: Nicotine-salt  Substance Use Topics  . Alcohol use: No  . Drug use: Yes    Types: Marijuana    Comment: 2-3 times week - store brought  Home Medications Prior to Admission medications   Medication Sig Start Date End Date Taking? Authorizing Provider  baclofen (LIORESAL) 20 MG tablet Take 1 tablet (20 mg total) by mouth 4 (four) times daily. 05/24/20   Ranelle Oyster, MD  clonazePAM (KLONOPIN) 0.5 MG tablet TAKE 1 TABLET(0.5 MG) BY MOUTH THREE TIMES DAILY AS NEEDED 03/09/18   Ranelle Oyster, MD  diclofenac (VOLTAREN) 75 MG EC tablet Take 1 tablet (75 mg total) by mouth 2 (two) times daily with a meal. 05/25/18   Ranelle Oyster, MD  FLUoxetine (PROZAC) 20 MG capsule TAKE 3 CAPSULES(60 MG) BY MOUTH DAILY 09/06/19   Ranelle Oyster, MD  gabapentin (NEURONTIN) 600 MG tablet TAKE 1 TABLET(600 MG) BY MOUTH FIVE TIMES DAILY 05/24/20   Ranelle Oyster, MD  HYDROcodone-acetaminophen (NORCO) 5-325 MG tablet Take 1 tablet by mouth every 6 (six) hours as  needed. Patient not taking: Reported on 05/08/2020 04/19/18   Cindee Salt, MD  methadone (DOLOPHINE) 10 MG tablet Take 1 tablet (10 mg total) by mouth every 12 (twelve) hours. 05/08/20   Jones Bales, NP  ondansetron (ZOFRAN ODT) 4 MG disintegrating tablet Take 1 tablet (4 mg total) by mouth every 8 (eight) hours as needed for nausea or vomiting. 11/23/19   Vanetta Mulders, MD  sildenafil (VIAGRA) 100 MG tablet TAKE 1/2 TO 1 TABLET BY MOUTH DAILY AS NEEDED FOR ERECTILE DYSFUNCTION 04/12/18   Ranelle Oyster, MD    Allergies    Patient has no known allergies.  Review of Systems   Review of Systems  All other systems reviewed and are negative.   Physical Exam Updated Vital Signs BP (!) 154/95 (BP Location: Left Arm)   Pulse (!) 110   Temp 98.9 F (37.2 C) (Oral)   Resp (!) 22   SpO2 99%   Physical Exam Vitals and nursing note reviewed.  Constitutional:      Appearance: He is well-developed.     Comments: Uncomfortable appearing  HENT:     Head: Normocephalic and atraumatic.  Eyes:     Conjunctiva/sclera: Conjunctivae normal.  Cardiovascular:     Rate and Rhythm: Normal rate and regular rhythm.     Heart sounds: No murmur heard.   Pulmonary:     Effort: Pulmonary effort is normal. No respiratory distress.     Breath sounds: Normal breath sounds.  Abdominal:     Palpations: Abdomen is soft.     Tenderness: There is no abdominal tenderness.     Comments: No focal abdominal tenderness  Musculoskeletal:        General: Normal range of motion.     Cervical back: Neck supple.  Skin:    General: Skin is warm and dry.  Neurological:     Mental Status: He is alert and oriented to person, place, and time.  Psychiatric:        Mood and Affect: Mood normal.        Behavior: Behavior normal.     ED Results / Procedures / Treatments   Labs (all labs ordered are listed, but only abnormal results are displayed) Labs Reviewed  URINALYSIS, ROUTINE W REFLEX MICROSCOPIC   BASIC METABOLIC PANEL  CBC    EKG None  Radiology No results found.  Procedures Procedures   Medications Ordered in ED Medications  HYDROmorphone (DILAUDID) injection 1 mg (has no administration in time range)  ketorolac (TORADOL) 30 MG/ML injection 30 mg (30 mg Intravenous Given 10/05/20 0617)  ondansetron (ZOFRAN)  injection 4 mg (4 mg Intravenous Given 10/05/20 2542)    ED Course  I have reviewed the triage vital signs and the nursing notes.  Pertinent labs & imaging results that were available during my care of the patient were reviewed by me and considered in my medical decision making (see chart for details).    MDM Rules/Calculators/A&P                         Patient here with right-sided flank pain.  He states that he was recently diagnosed with a urinary tract infection.  He states that now it feels like a kidney stone.  He states that he has had similar pains in the past with kidney stone.  Will check labs and CT renal to look for kidney stone.  RN asked if I could assist with IV placement.  IV was placed under ultrasound guidance.  Patient signed out to [place], PA-C at shift change.  Final Clinical Impression(s) / ED Diagnoses Final diagnoses:  Kidney stone    Rx / DC Orders ED Discharge Orders    None       Long, Arlyss Repress, MD 10/08/20 [phone]

## 2020-10-05 NOTE — ED Triage Notes (Addendum)
Pt arrived via walk in, c/o right sided flank pain, painful urination x2 days, was seen by PCP started on abx, has taken one dose. Worsening pain, and started with blood in urine this morning.

## 2020-10-05 NOTE — ED Provider Notes (Signed)
.  R flank pain, suspect Kidney stone. Imaging pending. ? Pyelo  Pt CT returned, no evidence of UTI> Pain addressed. Patient is without severe pain or vomiting at this pain. Will d/c with close OP follow up and return precautions.   Arthor Captain, PA-C 10/05/20 1652    Pollyann Savoy, MD 10/06/20 [phone]

## 2020-10-09 ENCOUNTER — Other Ambulatory Visit: Payer: Self-pay | Admitting: Physical Medicine & Rehabilitation

## 2020-10-09 DIAGNOSIS — G894 Chronic pain syndrome: Secondary | ICD-10-CM

## 2020-10-09 DIAGNOSIS — Z79899 Other long term (current) drug therapy: Secondary | ICD-10-CM

## 2020-10-09 DIAGNOSIS — S14145S Brown-Sequard syndrome at C5 level of cervical spinal cord, sequela: Secondary | ICD-10-CM

## 2020-10-09 DIAGNOSIS — G8111 Spastic hemiplegia affecting right dominant side: Secondary | ICD-10-CM

## 2020-10-09 DIAGNOSIS — Z5181 Encounter for therapeutic drug level monitoring: Secondary | ICD-10-CM

## 2020-10-12 ENCOUNTER — Other Ambulatory Visit: Payer: Self-pay | Admitting: Physical Medicine & Rehabilitation

## 2020-10-12 DIAGNOSIS — G894 Chronic pain syndrome: Secondary | ICD-10-CM

## 2020-10-12 DIAGNOSIS — Z5181 Encounter for therapeutic drug level monitoring: Secondary | ICD-10-CM

## 2020-10-12 DIAGNOSIS — S14145S Brown-Sequard syndrome at C5 level of cervical spinal cord, sequela: Secondary | ICD-10-CM

## 2020-10-12 DIAGNOSIS — G8111 Spastic hemiplegia affecting right dominant side: Secondary | ICD-10-CM

## 2020-10-12 DIAGNOSIS — Z79899 Other long term (current) drug therapy: Secondary | ICD-10-CM

## 2021-11-08 ENCOUNTER — Encounter (HOSPITAL_BASED_OUTPATIENT_CLINIC_OR_DEPARTMENT_OTHER): Payer: Self-pay | Admitting: Obstetrics and Gynecology

## 2021-11-08 ENCOUNTER — Emergency Department (HOSPITAL_BASED_OUTPATIENT_CLINIC_OR_DEPARTMENT_OTHER)
Admission: EM | Admit: 2021-11-08 | Discharge: 2021-11-08 | Disposition: A | Discharge status: Home / Self Care | Payer: Medicare HMO | Attending: Emergency Medicine | Admitting: Emergency Medicine

## 2021-11-08 ENCOUNTER — Emergency Department (HOSPITAL_BASED_OUTPATIENT_CLINIC_OR_DEPARTMENT_OTHER): Payer: Medicare HMO

## 2021-11-08 ENCOUNTER — Other Ambulatory Visit: Payer: Self-pay

## 2021-11-08 DIAGNOSIS — R0902 Hypoxemia: Secondary | ICD-10-CM | POA: Insufficient documentation

## 2021-11-08 DIAGNOSIS — T40711A Poisoning by cannabis, accidental (unintentional), initial encounter: Secondary | ICD-10-CM | POA: Insufficient documentation

## 2021-11-08 DIAGNOSIS — R4182 Altered mental status, unspecified: Secondary | ICD-10-CM | POA: Insufficient documentation

## 2021-11-08 DIAGNOSIS — T50901A Poisoning by unspecified drugs, medicaments and biological substances, accidental (unintentional), initial encounter: Secondary | ICD-10-CM

## 2021-11-08 DIAGNOSIS — T391X1A Poisoning by 4-Aminophenol derivatives, accidental (unintentional), initial encounter: Secondary | ICD-10-CM | POA: Diagnosis present

## 2021-11-08 LAB — BASIC METABOLIC PANEL
Anion gap: 7 (ref 5–15)
BUN: 15 mg/dL (ref 6–20)
CO2: 30 mmol/L (ref 22–32)
Calcium: 10 mg/dL (ref 8.9–10.3)
Chloride: 105 mmol/L (ref 98–111)
Creatinine, Ser: 0.85 mg/dL (ref 0.61–1.24)
GFR, Estimated: >60 mL/min (ref >60)
Glucose, Bld: 104 mg/dL — ABNORMAL HIGH (ref 70–99)
Potassium: 4.1 mmol/L (ref 3.5–5.1)
Sodium: 142 mmol/L (ref 135–145)

## 2021-11-08 LAB — CBC
HCT: 39.5 % (ref 39.0–52.0)
Hemoglobin: 13.2 g/dL (ref 13.0–17.0)
MCH: 28.4 pg (ref 26.0–34.0)
MCHC: 33.4 g/dL (ref 30.0–36.0)
MCV: 85.1 fL (ref 80.0–100.0)
Platelets: 322 10*3/uL (ref 150–400)
RBC: 4.64 MIL/uL (ref 4.22–5.81)
RDW: 13.8 % (ref 11.5–15.5)
WBC: 7.2 10*3/uL (ref 4.0–10.5)
nRBC: 0 % (ref 0.0–0.2)

## 2021-11-08 MED ORDER — NALOXONE HCL 0.4 MG/ML IJ SOLN
0.4000 mg | Freq: Once | INTRAMUSCULAR | Status: AC
  Administered 2021-11-08: 0.4 mg via INTRAVENOUS
  Filled 2021-11-08: qty 1

## 2021-11-08 MED ORDER — NALOXONE HCL 4 MG/0.1ML NA LIQD: NASAL | 0 refills | Status: AC

## 2021-11-08 NOTE — ED Notes (Signed)
Pt now to CT dept via stretcher -- pt awake and more alert - remains on simple face mask  ?

## 2021-11-08 NOTE — ED Notes (Signed)
ED Provider at bedside.  Pt aroused with painful stimuli and is progressively becoming more alert -- able to follow simple commands and is responding more appropriately to questions.  Remains on continuous cardiac and pulse ox monitoring.  This nurse will continue to monitor for acute changes and maintain plan of care.  ?

## 2021-11-08 NOTE — ED Triage Notes (Signed)
Patient reports to the ER via EMS for use of Delta 8 today around 5pm. Patient reports he took 300mg  of it in gummy form. Patient reports he feels high. Patient also reports he took percocet.  ? ?02 saturation is noted to be dipping into the 80's when patient falls asleep. ?

## 2021-11-08 NOTE — ED Notes (Signed)
RT assessed pt initially in triage bradypnea poor ventilation. Pt came in via EMS as drug overdose. When pt is awake pt ventilates appropriately. Pt placed initially on  then transitioned to simple mask d/t desaturations. RT will continue to monitor.  ?

## 2021-11-08 NOTE — ED Notes (Signed)
Pt now returned from CT dept via stretcher  

## 2021-11-08 NOTE — ED Provider Notes (Signed)
?[facility] EMERGENCY DEPT ?Provider Note ? ? ?CSN: [id number] ?Arrival date & time: 11/08/21  1858 ? ?  ? ?History ? ?Chief Complaint  ?Patient presents with  ? Drug Overdose  ? ? ?Billy Kelley is a 46 y.o. 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  Patient reports that he took a 300 mg delta 8 gummy, as well as a Percocet that he received from someone else, was not a prescribed Percocet.  Patient is unsure of the formulation, dosage of this medication.  Patient arrived somnolent, with low oxygen saturation.  He was arousable to sternal rub, he initially declined any additional drug use at that then delta 8, later admitting to his Percocet usage.  He denies any other drug use at this time.  He denies any pain, weakness or numbness different from his normal Brown-S?quard findings, blurry vision, double vision, or other abnormalities.  He does not have a previous history of drug overdose. ? ? ?Drug Overdose ? ? ?  ? ?Home Medications ?Prior to Admission medications   ?Medication Sig Start Date End Date Taking? Authorizing Provider  ?naloxone [facility]) nasal spray 4 mg/0.1 mL Follow instructions on packaging 11/08/21  Yes Tegeler, Canary Brim, MD  ?baclofen (LIORESAL) 20 MG tablet Take 1 tablet (20 mg total) by mouth 4 (four) times daily. 05/24/20   Ranelle Oyster, MD  ?celecoxib (CELEBREX) 200 MG capsule Take 1 capsule (200 mg total) by mouth 2 (two) times daily. 10/05/20   Arthor Captain, PA-C  ?clonazePAM (KLONOPIN) 0.5 MG tablet TAKE 1 TABLET(0.5 MG) BY MOUTH THREE TIMES DAILY AS NEEDED 03/09/18   Ranelle Oyster, MD  ?FLUoxetine (PROZAC) 20 MG capsule TAKE 3 CAPSULES(60 MG) BY MOUTH DAILY 09/06/19   Ranelle Oyster, MD  ?gabapentin (NEURONTIN) 600 MG tablet TAKE 1 TABLET(600 MG) BY MOUTH FIVE TIMES DAILY 05/24/20   Ranelle Oyster, MD  ?HYDROcodone-acetaminophen (NORCO) 5-325 MG tablet Take 1 tablet by  mouth every 6 (six) hours as needed. ?Patient not taking: Reported on 05/08/2020 04/19/18   Cindee Salt, MD  ?methadone (DOLOPHINE) 10 MG tablet Take 1 tablet (10 mg total) by mouth every 12 (twelve) hours. 05/08/20   Jones Bales, NP  ?ondansetron (ZOFRAN ODT) 4 MG disintegrating tablet Take 1 tablet (4 mg total) by mouth every 8 (eight) hours as needed for nausea or vomiting. 11/23/19   Vanetta Mulders, MD  ?ondansetron (ZOFRAN) 4 MG tablet Take 1 tablet (4 mg total) by mouth every 8 (eight) hours as needed for nausea or vomiting. 10/05/20   Arthor Captain, PA-C  ?sildenafil (VIAGRA) 100 MG tablet TAKE 1/2 TO 1 TABLET BY MOUTH DAILY AS NEEDED FOR ERECTILE DYSFUNCTION 04/12/18   Ranelle Oyster, MD  ?tamsulosin (FLOMAX) 0.4 MG CAPS capsule Take 1 capsule (0.4 mg total) by mouth 2 (two) times daily. 10/05/20   Arthor Captain, PA-C  ?   ? ?Allergies    ?Patient has no known allergies.   ? ?Review of Systems   ?Review of Systems  ?All other systems reviewed and are negative. ? ?Physical Exam ?Updated Vital Signs ?BP 124/80   Pulse 96   Temp 98 ?F (36.7 ?C)   Resp 19   Ht 6\' 1"  (1.854 m)   Wt 90.7 kg   SpO2 94%   BMI 26.39 kg/m?  ?Physical Exam ?Vitals and nursing note reviewed.  ?Constitutional:   ?   General: He is not  in acute distress. ?   Appearance: Normal appearance.  ?HENT:  ?   Head: Normocephalic and atraumatic.  ?Eyes:  ?   General:     ?   Right eye: No discharge.     ?   Left eye: No discharge.  ?   Comments: Non-pinpoint. Pupils approx 67mm bilaterally and equal.  ?Cardiovascular:  ?   Rate and Rhythm: Normal rate and regular rhythm.  ?   Heart sounds: No murmur heard. ?  No friction rub. No gallop.  ?Pulmonary:  ?   Effort: Pulmonary effort is normal.  ?   Breath sounds: Normal breath sounds.  ?Abdominal:  ?   General: Bowel sounds are normal.  ?   Palpations: Abdomen is soft.  ?Skin: ?   General: Skin is warm and dry.  ?   Capillary Refill: Capillary refill takes less than 2 seconds.   ?Neurological:  ?   Mental Status: He is alert and oriented to person, place, and time.  ?   Comments: Patient initially confused, poorly arousable, but moves all 4 limbs spontaneously, and awakens to sternal rub.  He is initially somewhat confused, only oriented to self.  After Narcan, observation he is alert and oriented x3.  He is otherwise neurologically intact with no cranial nerve deficits, normal coordination.  ?Psychiatric:     ?   Mood and Affect: Mood normal.     ?   Behavior: Behavior normal.  ? ? ?ED Results / Procedures / Treatments   ?Labs ?(all labs ordered are listed, but only abnormal results are displayed) ?Labs Reviewed  ?BASIC METABOLIC PANEL - Abnormal; Notable for the following components:  ?    Result Value  ? Glucose, Bld 104 (*)   ? All other components within normal limits  ?CBC  ? ? ?EKG ?None ? ?Radiology ?CT Head Wo Contrast ? ?Result Date: 11/08/2021 ?CLINICAL DATA:  Altered mental status and increased somnolence. Possible drug overdose. EXAM: CT HEAD WITHOUT CONTRAST TECHNIQUE: Contiguous axial images were obtained from the base of the skull through the vertex without intravenous contrast. RADIATION DOSE REDUCTION: This exam was performed according to the departmental dose-optimization program which includes automated exposure control, adjustment of the mA and/or kV according to patient size and/or use of iterative reconstruction technique. COMPARISON:  None. FINDINGS: Brain: No evidence of acute infarction, hemorrhage, hydrocephalus, extra-axial collection or mass lesion/mass effect. Vascular: No hyperdense vessel or unexpected calcification. Skull: Normal. Negative for fracture or focal lesion. Sinuses/Orbits: No acute finding. Other: None. IMPRESSION: No acute intracranial pathology. Electronically Signed   By: Aram Candela M.D.   On: 11/08/2021 20:22   ? ?Procedures ?Procedures  ? ? ?Medications Ordered in ED ?Medications  ?naloxone [facility]) injection 0.4 mg (0.4 mg  Intravenous Given 11/08/21 2028)  ? ? ?ED Course/ Medical Decision Making/ A&P ?  ?                        ?Medical Decision Making ?Amount and/or Complexity of Data Reviewed ?Labs: ordered. ?Radiology: ordered. ? ?Risk ?Prescription drug management. ? ? ?This patient presents to the ED for concern of accidental overdose, hypoxia secondary to decreased respiratory effort, this involves an extensive number of treatment options, and is a complaint that carries with it a high risk of complications and morbidity. The emergent differential diagnosis prior to evaluation includes, but is not limited to,  narcotic vs. Other intoxicant overdose, other intracranial abnormality, less likely to be atypical  stroke presentation, cardiogenic syncope, acute encephalopathy.  ? ?This is not an exhaustive differential.  ? 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 ?Additional history: ?Chart reviewed. Pertinent results include: labwork and imaging from previous ED visits, outpatient PCP, physical therapy visits ? ?Physical Exam: ?Physical exam performed. The pertinent findings include: patient initially somnolent with poor respiratory effort, poorly arousable. His pupils are not pinpoint. When aroused with sternal rub patient in neurologically intact despite some slow speech, confusion. Aox3. ? ?Lab Tests: ?I ordered, and personally interpreted labs.  The pertinent results include:  unremarkable BMP, CBC. Patient did not provide urine for UDS prior to clinical resolution of intoxication. ?  ?Imaging Studies: ?I ordered imaging studies including CT head wo contrast. I independently visualized and interpreted imaging which showed no evidence of intracranial abnormality. I agree with the radiologist interpretation. ?  ?Cardiac Monitoring:  ?The patient was maintained on a cardiac monitor.  My attending physician Dr. Rush Landmarkegeler viewed and interpreted the cardiac monitored  which showed an underlying rhythm of: NSR ?  ?Medications: ?I ordered medication including narcan  for possible narcotic overdose. Reevaluation of the patient after these medicines showed that the patient improved. I have reviewed

## 2021-11-08 NOTE — ED Notes (Signed)
Pt  awake and alert sitting up in bed with visitors at the bedside; pt noted to be on cellphone.  RR even and unlabored on RA at this time.  Continuous cardiac and pulse ox maintained.   ?

## 2021-11-08 NOTE — ED Notes (Signed)
Pt awake and alert- agreeable with d/c plan as discussed by provider- this nurse has verbally reinforced d/c instructions and provided pt with written copy- pt acknowledges verbal understanding and denies any additional questions, concerns, needs- ambulatory at discharge with slight dragging of RLE which is baseline for patient -- vitals stable-- no distress.  ?

## 2021-11-08 NOTE — ED Notes (Signed)
After transferring pt to room 9, pt was becoming increasingly somnolent with a good pleth wave of 79%, placed on 2L of O2, still having O2 sat's at 82%, increased to 4L, with slow mouth breathing noted at that time. RT is aware.  ?

## 2021-12-17 ENCOUNTER — Other Ambulatory Visit: Payer: Self-pay | Admitting: Orthopaedic Surgery

## 2021-12-17 DIAGNOSIS — M47896 Other spondylosis, lumbar region: Secondary | ICD-10-CM

## 2021-12-22 ENCOUNTER — Ambulatory Visit
Admission: RE | Admit: 2021-12-22 | Discharge: 2021-12-22 | Disposition: A | Discharge status: Home / Self Care | Payer: Medicare HMO | Source: Ambulatory Visit | Attending: Orthopaedic Surgery | Admitting: Orthopaedic Surgery

## 2021-12-22 DIAGNOSIS — M47896 Other spondylosis, lumbar region: Secondary | ICD-10-CM

## 2021-12-25 ENCOUNTER — Other Ambulatory Visit: Payer: Self-pay | Admitting: Rehabilitation

## 2021-12-25 DIAGNOSIS — M461 Sacroiliitis, not elsewhere classified: Secondary | ICD-10-CM

## 2021-12-28 ENCOUNTER — Ambulatory Visit
Admission: RE | Admit: 2021-12-28 | Discharge: 2021-12-28 | Disposition: A | Discharge status: Home / Self Care | Payer: Medicare HMO | Source: Ambulatory Visit | Attending: Rehabilitation | Admitting: Rehabilitation

## 2021-12-28 DIAGNOSIS — M461 Sacroiliitis, not elsewhere classified: Secondary | ICD-10-CM

## 2021-12-28 MED ORDER — GADOBENATE DIMEGLUMINE 529 MG/ML IV SOLN
19.0000 mL | Freq: Once | INTRAVENOUS | Status: AC | PRN
  Administered 2021-12-28: 19 mL via INTRAVENOUS

## 2021-12-29 ENCOUNTER — Other Ambulatory Visit: Payer: Medicare HMO

## 2023-02-13 ENCOUNTER — Emergency Department (HOSPITAL_BASED_OUTPATIENT_CLINIC_OR_DEPARTMENT_OTHER)
Admission: EM | Admit: 2023-02-13 | Discharge: 2023-02-13 | Disposition: A | Discharge status: Home / Self Care | Payer: Medicare HMO | Attending: Emergency Medicine | Admitting: Emergency Medicine

## 2023-02-13 ENCOUNTER — Encounter (HOSPITAL_BASED_OUTPATIENT_CLINIC_OR_DEPARTMENT_OTHER): Payer: Self-pay | Admitting: Emergency Medicine

## 2023-02-13 ENCOUNTER — Other Ambulatory Visit: Payer: Self-pay

## 2023-02-13 DIAGNOSIS — F1193 Opioid use, unspecified with withdrawal: Secondary | ICD-10-CM | POA: Insufficient documentation

## 2023-02-13 HISTORY — DX: Other psychoactive substance abuse, uncomplicated: F19.10

## 2023-02-13 LAB — COMPREHENSIVE METABOLIC PANEL
ALT: 18 U/L (ref 0–44)
AST: 15 U/L (ref 15–41)
Albumin: 4.9 g/dL (ref 3.5–5.0)
Alkaline Phosphatase: 91 U/L (ref 38–126)
Anion gap: 10 (ref 5–15)
BUN: 8 mg/dL (ref 6–20)
CO2: 28 mmol/L (ref 22–32)
Calcium: 10.1 mg/dL (ref 8.9–10.3)
Chloride: 101 mmol/L (ref 98–111)
Creatinine, Ser: 0.81 mg/dL (ref 0.61–1.24)
GFR, Estimated: >60 mL/min (ref >60)
Glucose, Bld: 104 mg/dL — ABNORMAL HIGH (ref 70–99)
Potassium: 3.9 mmol/L (ref 3.5–5.1)
Sodium: 139 mmol/L (ref 135–145)
Total Bilirubin: 0.5 mg/dL (ref 0.3–1.2)
Total Protein: 7.8 g/dL (ref 6.5–8.1)

## 2023-02-13 LAB — CBC
HCT: 40.4 % (ref 39.0–52.0)
Hemoglobin: 14.1 g/dL (ref 13.0–17.0)
MCH: 29.3 pg (ref 26.0–34.0)
MCHC: 34.9 g/dL (ref 30.0–36.0)
MCV: 83.8 fL (ref 80.0–100.0)
Platelets: 407 10*3/uL — ABNORMAL HIGH (ref 150–400)
RBC: 4.82 MIL/uL (ref 4.22–5.81)
RDW: 13.2 % (ref 11.5–15.5)
WBC: 6.6 10*3/uL (ref 4.0–10.5)
nRBC: 0 % (ref 0.0–0.2)

## 2023-02-13 MED ORDER — ONDANSETRON HCL 4 MG PO TABS: 4.0000 mg | ORAL_TABLET | Freq: Three times a day (TID) | ORAL | 0 refills | Status: AC | PRN

## 2023-02-13 MED ORDER — LORAZEPAM 1 MG PO TABS
1.0000 mg | ORAL_TABLET | Freq: Once | ORAL | Status: AC
  Administered 2023-02-13: 1 mg via ORAL
  Filled 2023-02-13: qty 1

## 2023-02-13 MED ORDER — LORAZEPAM 1 MG PO TABS
2.0000 mg | ORAL_TABLET | Freq: Once | ORAL | Status: AC
  Administered 2023-02-13: 2 mg via ORAL
  Filled 2023-02-13: qty 2

## 2023-02-13 NOTE — ED Triage Notes (Signed)
Pt last had fentanyl on Tuesday. He has been trying to detox by himself. Pt states he hasn't been able to sleep since Tuesday,he is feeling like he is going crazy. Pt states he is an incomplete quad, left side affected, he has chronic pain on that side . He is on methadone as well.

## 2023-02-13 NOTE — ED Provider Notes (Signed)
[place] EMERGENCY DEPARTMENT AT [facility] Provider Note   CSN: [id number] Arrival date & time: 02/13/23  1717     History  No chief complaint on file.   Billy Kelley is a 47 y.o. male with past medical history significant for spinal cord injury, Brown-Squard syndrome, chronic pain syndrome, drug abuse, he is chronically on 80 mg methadone.  Patient reports that he has been recreationally using around $40 of fentanyl a day and is "ready to quit".  Patient reports that he has been having detox symptoms, feeling "like he is going crazy".  He reports that he has been unable to sleep since Tuesday.  He denies any SI, HI, AVH.  He denies any nausea, vomiting, abdominal pain, diarrhea, constipation.  He denies any other alcohol or drug use.  HPI     Home Medications Prior to Admission medications   Medication Sig Start Date End Date Taking? Authorizing Provider  baclofen (LIORESAL) 20 MG tablet Take 1 tablet (20 mg total) by mouth 4 (four) times daily. 05/24/20   Ranelle Oyster, MD  celecoxib (CELEBREX) 200 MG capsule Take 1 capsule (200 mg total) by mouth 2 (two) times daily. 10/05/20   Arthor Captain, PA-C  clonazePAM (KLONOPIN) 0.5 MG tablet TAKE 1 TABLET(0.5 MG) BY MOUTH THREE TIMES DAILY AS NEEDED 03/09/18   Ranelle Oyster, MD  FLUoxetine (PROZAC) 20 MG capsule TAKE 3 CAPSULES(60 MG) BY MOUTH DAILY 09/06/19   Ranelle Oyster, MD  gabapentin (NEURONTIN) 600 MG tablet TAKE 1 TABLET(600 MG) BY MOUTH FIVE TIMES DAILY 05/24/20   Ranelle Oyster, MD  HYDROcodone-acetaminophen (NORCO) 5-325 MG tablet Take 1 tablet by mouth every 6 (six) hours as needed. Patient not taking: Reported on 05/08/2020 04/19/18   Cindee Salt, MD  methadone (DOLOPHINE) 10 MG tablet Take 1 tablet (10 mg total) by mouth every 12 (twelve) hours. 05/08/20   Jones Bales, NP  naloxone [facility]) nasal spray 4 mg/0.1 mL Follow instructions on packaging 11/08/21   Tegeler, Canary Brim, MD   ondansetron (ZOFRAN ODT) 4 MG disintegrating tablet Take 1 tablet (4 mg total) by mouth every 8 (eight) hours as needed for nausea or vomiting. 11/23/19   Vanetta Mulders, MD  ondansetron (ZOFRAN) 4 MG tablet Take 1 tablet (4 mg total) by mouth every 8 (eight) hours as needed for nausea or vomiting. 02/13/23   Helayna Dun H, PA-C  sildenafil (VIAGRA) 100 MG tablet TAKE 1/2 TO 1 TABLET BY MOUTH DAILY AS NEEDED FOR ERECTILE DYSFUNCTION 04/12/18   Ranelle Oyster, MD  tamsulosin (FLOMAX) 0.4 MG CAPS capsule Take 1 capsule (0.4 mg total) by mouth 2 (two) times daily. 10/05/20   Arthor Captain, PA-C      Allergies    Patient has no known allergies.    Review of Systems   Review of Systems  All other systems reviewed and are negative.   Physical Exam Updated Vital Signs BP 122/84   Pulse 96   Temp 98.6 F (37 C) (Oral)   Resp 16   SpO2 98%  Physical Exam Vitals and nursing note reviewed.  Constitutional:      General: He is not in acute distress.    Appearance: Normal appearance.  HENT:     Head: Normocephalic and atraumatic.  Eyes:     General:        Right eye: No discharge.        Left eye: No discharge.  Cardiovascular:     Rate and  Rhythm: Normal rate and regular rhythm.     Heart sounds: No murmur heard.    No friction rub. No gallop.  Pulmonary:     Effort: Pulmonary effort is normal.     Breath sounds: Normal breath sounds.  Abdominal:     General: Bowel sounds are normal.     Palpations: Abdomen is soft.  Skin:    General: Skin is warm and dry.     Capillary Refill: Capillary refill takes less than 2 seconds.  Neurological:     General: No focal deficit present.     Mental Status: He is alert. Mental status is at baseline.  Psychiatric:        Mood and Affect: Mood normal.        Behavior: Behavior normal.     Comments: anxious     ED Results / Procedures / Treatments   Labs (all labs ordered are listed, but only abnormal results are  displayed) Labs Reviewed  CBC - Abnormal; Notable for the following components:      Result Value   Platelets 407 (*)    All other components within normal limits  COMPREHENSIVE METABOLIC PANEL - Abnormal; Notable for the following components:   Glucose, Bld 104 (*)    All other components within normal limits    EKG None  Radiology No results found.  Procedures Procedures    Medications Ordered in ED Medications  LORazepam (ATIVAN) tablet 2 mg (2 mg Oral Given 02/13/23 1748)  LORazepam (ATIVAN) tablet 1 mg (1 mg Oral Given 02/13/23 1925)    ED Course/ Medical Decision Making/ A&P                                 Medical Decision Making Amount and/or Complexity of Data Reviewed Labs: ordered.  Risk Prescription drug management.   This patient is a 47 y.o. male  who presents to the ED for concern of drug withdrawal symptoms.   Differential diagnoses prior to evaluation: The emergent differential diagnosis includes, but is not limited to, acute alcohol withdrawal, DTs, versus narcotic withdrawal, nausea, vomiting, dehydration, anxiety, SI, HI, AVH, versus other. This is not an exhaustive differential.   Past Medical History / Co-morbidities / Social History: History of Brown-Squard syndrome, chronic opioid use  Additional history: Chart reviewed. Pertinent results include: Reviewed lab work, imaging from previous emergency department visits  Physical Exam: Physical exam performed. The pertinent findings include: Patient's vital signs are stable, he is not having any active nausea, vomiting in the emergency department  Lab Tests/Imaging studies: I personally interpreted labs/imaging and the pertinent results include: CBC with mild elevation in platelets of 407, otherwise unremarkable, BMP unremarkable.  Cardiac monitoring: EKG obtained and interpreted by myself and attending physician which shows: Normal sinus rhythm   Medications: I ordered medication including  Ativan for anxiety, withdrawal symptoms.  I have reviewed the patients home medicines and have made adjustments as needed.   Disposition: After consideration of the diagnostic results and the patients response to treatment, I feel that patient is stable for discharge, encouraged PCP follow-up, Benadryl as needed for sleep, withdrawal symptoms, encouraged continuing to go to his methadone clinic.   emergency department workup does not suggest an emergent condition requiring admission or immediate intervention beyond what has been performed at this time. The plan is: as above. The patient is safe for discharge and has been instructed to return immediately for  worsening symptoms, change in symptoms or any other concerns.  Final Clinical Impression(s) / ED Diagnoses Final diagnoses:  Opioid withdrawal (HCC)    Rx / DC Orders ED Discharge Orders          Ordered    ondansetron (ZOFRAN) 4 MG tablet  Every 8 hours PRN        02/13/23 1938              Devonte Migues, Edyth Gunnels 02/13/23 Otho Darner, MD 02/13/23 [phone]

## 2023-03-04 ENCOUNTER — Other Ambulatory Visit (HOSPITAL_BASED_OUTPATIENT_CLINIC_OR_DEPARTMENT_OTHER): Payer: Self-pay

## 2023-03-04 ENCOUNTER — Emergency Department (HOSPITAL_BASED_OUTPATIENT_CLINIC_OR_DEPARTMENT_OTHER)
Admission: EM | Admit: 2023-03-04 | Discharge: 2023-03-04 | Disposition: A | Discharge status: Home / Self Care | Payer: Medicare HMO | Attending: Emergency Medicine | Admitting: Emergency Medicine

## 2023-03-04 ENCOUNTER — Other Ambulatory Visit: Payer: Self-pay

## 2023-03-04 ENCOUNTER — Emergency Department (HOSPITAL_BASED_OUTPATIENT_CLINIC_OR_DEPARTMENT_OTHER): Payer: Medicare HMO | Admitting: Radiology

## 2023-03-04 ENCOUNTER — Encounter (HOSPITAL_BASED_OUTPATIENT_CLINIC_OR_DEPARTMENT_OTHER): Payer: Self-pay

## 2023-03-04 DIAGNOSIS — S39012A Strain of muscle, fascia and tendon of lower back, initial encounter: Secondary | ICD-10-CM | POA: Insufficient documentation

## 2023-03-04 DIAGNOSIS — X58XXXA Exposure to other specified factors, initial encounter: Secondary | ICD-10-CM | POA: Diagnosis not present

## 2023-03-04 DIAGNOSIS — S3992XA Unspecified injury of lower back, initial encounter: Secondary | ICD-10-CM | POA: Diagnosis present

## 2023-03-04 MED ORDER — HYDROCODONE-ACETAMINOPHEN 5-325 MG PO TABS: 2.0000 | ORAL_TABLET | ORAL | 0 refills | Status: AC | PRN

## 2023-03-04 MED ORDER — CYCLOBENZAPRINE HCL 10 MG PO TABS: 10.0000 mg | ORAL_TABLET | Freq: Two times a day (BID) | ORAL | 0 refills | Status: AC | PRN

## 2023-03-04 MED ORDER — IBUPROFEN 600 MG PO TABS: 600.0000 mg | ORAL_TABLET | Freq: Four times a day (QID) | ORAL | 0 refills | Status: AC | PRN

## 2023-03-04 NOTE — Discharge Instructions (Signed)
Take Flexeril and ibuprofen as directed, and Norco sparingly if needed for additional pain. Rest the back, warm compresses. Follow up with Dr. Wynelle Link if pain is no better in 3-4 days.

## 2023-03-04 NOTE — ED Provider Notes (Signed)
[place] EMERGENCY DEPARTMENT AT [facility] Provider Note   CSN: [id number] Arrival date & time: 03/04/23  1419     History  No chief complaint on file.   Billy Kelley is a 47 y.o. male.  Patient here for evaluation of sudden onset of low back pain while walking earlier today. He describes the pain as sharp, shooting, "electric". No abdominal pain. No radiation to the lower extremities. No weakness, numbness, bowel/bladder dysfunction. No fall or direct impact. Worse now with certain positions.   The history is provided by the patient. No language interpreter was used.       Home Medications Prior to Admission medications   Medication Sig Start Date End Date Taking? Authorizing Provider  cyclobenzaprine (FLEXERIL) 10 MG tablet Take 1 tablet (10 mg total) by mouth 2 (two) times daily as needed for muscle spasms. 03/04/23  Yes Meredeth Ide, Conner M, PA-C  HYDROcodone-acetaminophen (NORCO/VICODIN) 5-325 MG tablet Take 2 tablets by mouth every 4 (four) hours as needed. 03/04/23  Yes Meredeth Ide, Conner M, PA-C  ibuprofen (ADVIL) 600 MG tablet Take 1 tablet (600 mg total) by mouth every 6 (six) hours as needed. 03/04/23  Yes Meredeth Ide, Conner M, PA-C  baclofen (LIORESAL) 20 MG tablet Take 1 tablet (20 mg total) by mouth 4 (four) times daily. 05/24/20   Ranelle Oyster, MD  celecoxib (CELEBREX) 200 MG capsule Take 1 capsule (200 mg total) by mouth 2 (two) times daily. 10/05/20   Arthor Captain, PA-C  clonazePAM (KLONOPIN) 0.5 MG tablet TAKE 1 TABLET(0.5 MG) BY MOUTH THREE TIMES DAILY AS NEEDED 03/09/18   Ranelle Oyster, MD  FLUoxetine (PROZAC) 20 MG capsule TAKE 3 CAPSULES(60 MG) BY MOUTH DAILY 09/06/19   Ranelle Oyster, MD  gabapentin (NEURONTIN) 600 MG tablet TAKE 1 TABLET(600 MG) BY MOUTH FIVE TIMES DAILY 05/24/20   Ranelle Oyster, MD  methadone (DOLOPHINE) 10 MG tablet Take 1 tablet (10 mg total) by mouth every 12 (twelve) hours. 05/08/20   Jones Bales, NP  naloxone  [facility]) nasal spray 4 mg/0.1 mL Follow instructions on packaging 11/08/21   Tegeler, Canary Brim, MD  ondansetron (ZOFRAN ODT) 4 MG disintegrating tablet Take 1 tablet (4 mg total) by mouth every 8 (eight) hours as needed for nausea or vomiting. 11/23/19   Vanetta Mulders, MD  ondansetron (ZOFRAN) 4 MG tablet Take 1 tablet (4 mg total) by mouth every 8 (eight) hours as needed for nausea or vomiting. 02/13/23   Prosperi, Christian H, PA-C  sildenafil (VIAGRA) 100 MG tablet TAKE 1/2 TO 1 TABLET BY MOUTH DAILY AS NEEDED FOR ERECTILE DYSFUNCTION 04/12/18   Ranelle Oyster, MD  tamsulosin (FLOMAX) 0.4 MG CAPS capsule Take 1 capsule (0.4 mg total) by mouth 2 (two) times daily. 10/05/20   Arthor Captain, PA-C      Allergies    Patient has no known allergies.    Review of Systems   Review of Systems  Physical Exam Updated Vital Signs BP 136/82 (BP Location: Right Arm)   Pulse (!) 110   Temp 99 F (37.2 C)   Resp 17   SpO2 97%  Physical Exam Vitals and nursing note reviewed.  Constitutional:      General: He is not in acute distress.    Appearance: Normal appearance.  Abdominal:     General: There is no distension.     Palpations: Abdomen is soft.     Tenderness: There is no abdominal tenderness.  Musculoskeletal:  General: Normal range of motion.     Comments: Mild left upper lumbar and midline upper lumbar tenderness. No swelling. Normal strength of left LE.   Skin:    General: Skin is warm and dry.  Neurological:     Mental Status: He is alert.     Sensory: No sensory deficit.     Deep Tendon Reflexes: Reflexes normal.     ED Results / Procedures / Treatments   Labs (all labs ordered are listed, but only abnormal results are displayed) Labs Reviewed - No data to display  EKG None  Radiology DG Lumbar Spine Complete  Result Date: 03/04/2023 CLINICAL DATA:  back pain EXAM: LUMBAR SPINE - COMPLETE 4+ VIEW COMPARISON:  MRI lumbar spine June 2023 FINDINGS: There is  relative straightening of the lumbar spine. Lumbar vertebral body heights appear maintained. Moderate to severe disc space disease of the lower lumbar spine most pronounced at L4-L5 and L5-S1. Paravertebral soft tissues are unremarkable. IMPRESSION: Multilevel moderate to severe disc space disease of the lower lumbar spine. Electronically Signed   By: Olive Bass M.D.   On: 03/04/2023 15:37    Procedures Procedures    Medications Ordered in ED Medications - No data to display  ED Course/ Medical Decision Making/ A&P                                 Medical Decision Making Sudden onset lumbar back pain today while walking. No neurologic red flags or exam deficits. No evidence cauda equina. Appropriate management felt to be conservative care and PCP follow up.   Amount and/or Complexity of Data Reviewed Radiology: ordered and independent interpretation performed.    Details: No bony abnormalities visualized. Radiology interpretation reviewed.   Risk Prescription drug management.           Final Clinical Impression(s) / ED Diagnoses Final diagnoses:  Lumbar strain, initial encounter    Rx / DC Orders ED Discharge Orders          Ordered    ibuprofen (ADVIL) 600 MG tablet  Every 6 hours PRN        03/04/23 1723    cyclobenzaprine (FLEXERIL) 10 MG tablet  2 times daily PRN        03/04/23 1723    HYDROcodone-acetaminophen (NORCO/VICODIN) 5-325 MG tablet  Every 4 hours PRN        03/04/23 1723              Elpidio Anis, PA-C 03/04/23 1725    Horton, Clabe Seal, DO 03/04/23 2236

## 2023-03-04 NOTE — ED Triage Notes (Signed)
Pt reports he was out shopping, "my lower back gave out, sharp/ stabbing pain between vertebrae." States pain radiates down L leg. Denies bowel/ bladder incontinence. Dull/ achy pain w intermittent "zinging." States he is on gabapentin/ baclofen for previous injury

## 2023-12-08 ENCOUNTER — Other Ambulatory Visit: Payer: Self-pay

## 2023-12-08 MED ORDER — DICLOFENAC SODIUM 3 % EX GEL
CUTANEOUS | 0 refills | Status: AC
  Filled 2023-12-08: qty 100, 30d supply, fill #0

## 2023-12-23 ENCOUNTER — Other Ambulatory Visit: Payer: Self-pay

## 2023-12-25 ENCOUNTER — Other Ambulatory Visit: Payer: Self-pay

## 2023-12-31 ENCOUNTER — Other Ambulatory Visit: Payer: Self-pay

## 2024-01-03 ENCOUNTER — Other Ambulatory Visit: Payer: Self-pay

## 2024-05-18 ENCOUNTER — Other Ambulatory Visit: Payer: Self-pay

## 2024-05-18 ENCOUNTER — Emergency Department (HOSPITAL_COMMUNITY)
Admission: EM | Admit: 2024-05-18 | Discharge: 2024-05-18 | Disposition: A | Discharge status: Home / Self Care | Attending: Emergency Medicine | Admitting: Emergency Medicine

## 2024-05-18 ENCOUNTER — Encounter (HOSPITAL_COMMUNITY): Payer: Self-pay

## 2024-05-18 DIAGNOSIS — F1110 Opioid abuse, uncomplicated: Secondary | ICD-10-CM | POA: Diagnosis present

## 2024-05-18 MED ORDER — ONDANSETRON 4 MG PO TBDP: 4.0000 mg | ORAL_TABLET | Freq: Three times a day (TID) | ORAL | 0 refills | Status: AC | PRN

## 2024-05-18 MED ORDER — MELATONIN 3 MG PO TABS: 3.0000 mg | ORAL_TABLET | Freq: Every day | ORAL | 2 refills | Status: AC

## 2024-05-18 MED ORDER — TRAZODONE HCL 100 MG PO TABS: 100.0000 mg | ORAL_TABLET | Freq: Every day | ORAL | 0 refills | Status: AC

## 2024-05-18 NOTE — ED Triage Notes (Signed)
 Pt states that he is on Methadone  for pain management and has been taking additional pain medications which he has been purchasing, he states that it is percocet.  Pt would like help detoxing off the percocet.

## 2024-05-18 NOTE — ED Triage Notes (Signed)
 Patient states he wants to detox from pills he was taking from the street but not from Methadone . Patient is scared of the detox symptoms.

## 2024-05-18 NOTE — ED Provider Notes (Signed)
 [place] EMERGENCY DEPARTMENT AT [facility] Provider Note   CSN: [id number] Arrival date & time: 05/18/24  1327     Patient presents with: Drug Problem   Billy Kelley is a 48 y.o. male.   48 year old male history of spinal cord injury, chronic pain syndrome, and opiate abuse on 90 mg of methadone  who presents to the emergency department with request for detox.  Says that recently because of his pain he has been using Percocet from a friend.  3 to 4 pills/day.  Unsure of the dosage.  Has been compliant with his methadone  but says that he is worried about withdrawals if he stops the Percocet at this point in time.  Denies any alcohol  or other drug use.  Not yet having any symptoms.  His biggest concern is insomnia.       Prior to Admission medications   Medication Sig Start Date End Date Taking? Authorizing Provider  melatonin 3 MG TABS tablet Take 1 tablet (3 mg total) by mouth at bedtime. 05/18/24  Yes Yolande Lamar BROCKS, MD  traZODone  (DESYREL ) 100 MG tablet Take 1 tablet (100 mg total) by mouth at bedtime for 7 days. 05/18/24 05/25/24 Yes Yolande Lamar BROCKS, MD  baclofen  (LIORESAL ) 20 MG tablet Take 1 tablet (20 mg total) by mouth 4 (four) times daily. 05/24/20   Babs Arthea DASEN, MD  celecoxib  (CELEBREX ) 200 MG capsule Take 1 capsule (200 mg total) by mouth 2 (two) times daily. 10/05/20   Harris, Abigail, PA-C  clonazePAM  (KLONOPIN ) 0.5 MG tablet TAKE 1 TABLET(0.5 MG) BY MOUTH THREE TIMES DAILY AS NEEDED 03/09/18   Babs Arthea DASEN, MD  cyclobenzaprine  (FLEXERIL ) 10 MG tablet Take 1 tablet (10 mg total) by mouth 2 (two) times daily as needed for muscle spasms. 03/04/23   Theotis Cameron HERO, PA-C  Diclofenac  Sodium 3 % GEL Apply topically to affected area(s) two times daily, as needed 12/08/23     FLUoxetine  (PROZAC ) 20 MG capsule TAKE 3 CAPSULES(60 MG) BY MOUTH DAILY 09/06/19   Babs Arthea DASEN, MD  gabapentin  (NEURONTIN ) 600 MG tablet TAKE 1 TABLET(600 MG) BY MOUTH FIVE  TIMES DAILY 05/24/20   Babs Arthea DASEN, MD  HYDROcodone -acetaminophen  (NORCO/VICODIN) 5-325 MG tablet Take 2 tablets by mouth every 4 (four) hours as needed. 03/04/23   Theotis Cameron M, PA-C  ibuprofen  (ADVIL ) 600 MG tablet Take 1 tablet (600 mg total) by mouth every 6 (six) hours as needed. 03/04/23   Theotis Cameron M, PA-C  methadone  (DOLOPHINE ) 10 MG tablet Take 1 tablet (10 mg total) by mouth every 12 (twelve) hours. 05/08/20   Debby Fidela CROME, NP  naloxone  (NARCAN ) nasal spray 4 mg/0.1 mL Follow instructions on packaging 11/08/21   Tegeler, Lonni PARAS, MD  ondansetron  (ZOFRAN  ODT) 4 MG disintegrating tablet Take 1 tablet (4 mg total) by mouth every 8 (eight) hours as needed for nausea or vomiting. 05/18/24   Yolande Lamar BROCKS, MD  ondansetron  (ZOFRAN ) 4 MG tablet Take 1 tablet (4 mg total) by mouth every 8 (eight) hours as needed for nausea or vomiting. 02/13/23   Prosperi, Christian H, PA-C  sildenafil  (VIAGRA ) 100 MG tablet TAKE 1/2 TO 1 TABLET BY MOUTH DAILY AS NEEDED FOR ERECTILE DYSFUNCTION 04/12/18   Babs Arthea DASEN, MD  tamsulosin  (FLOMAX ) 0.4 MG CAPS capsule Take 1 capsule (0.4 mg total) by mouth 2 (two) times daily. 10/05/20   Harris, Abigail, PA-C    Allergies: Patient has no known allergies.    Review of  Systems  Updated Vital Signs BP 116/73 (BP Location: Left Arm)   Pulse 97   Temp 98.7 F (37.1 C) (Oral)   Resp 20   Ht 6' 1 (1.854 m)   Wt 84.8 kg   SpO2 97%   BMI 24.67 kg/m   Physical Exam Constitutional:      Appearance: Normal appearance.  Eyes:     Extraocular Movements: Extraocular movements intact.     Conjunctiva/sclera: Conjunctivae normal.     Pupils: Pupils are equal, round, and reactive to light.     Comments: Pupils 6 mm bilaterally  Abdominal:     General: There is no distension.     Palpations: There is no mass.     Tenderness: There is no abdominal tenderness. There is no guarding.  Skin:    Comments: No piloerection noted.  No tremors or  tongue fasciculation.  Neurological:     Mental Status: He is alert and oriented to person, place, and time. Mental status is at baseline.     (all labs ordered are listed, but only abnormal results are displayed) Labs Reviewed - No data to display  EKG: None  Radiology: No results found.   Procedures   Medications Ordered in the ED - No data to display                                  Medical Decision Making Risk OTC drugs. Prescription drug management.   48 year old male with history of spinal cord injury, chronic pain syndrome, and opiate abuse on 90 mg of methadone  who presents emergency department with request for detox  Initial Ddx:  Opiate withdrawal, benzodiazepine withdrawal, alcohol  withdrawal  MDM/Course:  Patient presents emergency department requesting detox.  Does not appear to be in withdrawals at this point in time.  Says in the past his biggest concern has been his insomnia.  Unfortunately do not offer opiate detox and notified the patient was of this.  Does not appear to be in withdrawals at this point in time and I suspect that if he does go into withdrawals will be quite mild because he is already on methadone  90 mg.  He was given information and resources on centers to follow-up with regarding his substance use.  Says he has already been trying melatonin for sleep and so gave him a very short course of trazodone  to try and help with his insomnia as well.  This patient presents to the ED for concern of complaints listed in HPI, this involves an extensive number of treatment options, and is a complaint that carries with it a high risk of complications and morbidity. Disposition including potential need for admission considered.   Dispo: DC Home. Return precautions discussed including, but not limited to, those listed in the AVS. Allowed pt time to ask questions which were answered fully prior to dc.  Records reviewed ED Visit Notes I have reviewed the  patients home medications and made adjustments as needed Social Determinants of health:  Substance use  Portions of this note were generated with Scientist, clinical (histocompatibility and immunogenetics). Dictation errors may occur despite best attempts at proofreading.     Final diagnoses:  Opiate abuse, continuous [facility])    ED Discharge Orders          Ordered    melatonin 3 MG TABS tablet  Daily at bedtime        05/18/24 1737  ondansetron  (ZOFRAN  ODT) 4 MG disintegrating tablet  Every 8 hours PRN        05/18/24 1737    traZODone  (DESYREL ) 100 MG tablet  Daily at bedtime        05/18/24 1744               Yolande Lamar BROCKS, MD 05/20/24 0040

## 2024-05-18 NOTE — ED Notes (Signed)
 Pt currently has no symptoms, but sts he is concerned about what might happen.  Pt verbalized understanding of prescriptions, resources, and d/c paperwork.

## 2024-05-18 NOTE — Discharge Instructions (Addendum)
 You were seen for the concerns about opiate withdrawal in the emergency department.   At home, please continue your methadone .  Take Zofran  for any nausea or vomiting.  Take the melatonin and trazodone  to help you sleep.    Check your MyChart online for the results of any tests that had not resulted by the time you left the emergency department.   Follow-up with your primary doctor in 2-3 days regarding your visit.  Follow-up with any of the centers listed in this packet regarding additional resources for your substance use  Return immediately to the emergency department if you experience any of the following: Severe pain, vomiting despite the medicine, seizures, or any other concerning symptoms.    Thank you for visiting our Emergency Department. It was a pleasure taking care of you today.
